# Patient Record
Sex: Female | Born: 1950 | Race: White | Hispanic: No | Marital: Married | Smoking: Never smoker
Health system: Southern US, Community
[De-identification: ages and names within clinical notes are randomized; demographics above are authoritative.]

## PROBLEM LIST (undated history)

## (undated) DIAGNOSIS — K219 Gastro-esophageal reflux disease without esophagitis: Secondary | ICD-10-CM

## (undated) DIAGNOSIS — Z923 Personal history of irradiation: Secondary | ICD-10-CM

## (undated) DIAGNOSIS — K5790 Diverticulosis of intestine, part unspecified, without perforation or abscess without bleeding: Secondary | ICD-10-CM

## (undated) DIAGNOSIS — E079 Disorder of thyroid, unspecified: Secondary | ICD-10-CM

## (undated) DIAGNOSIS — C50919 Malignant neoplasm of unspecified site of unspecified female breast: Secondary | ICD-10-CM

## (undated) DIAGNOSIS — Z973 Presence of spectacles and contact lenses: Secondary | ICD-10-CM

## (undated) DIAGNOSIS — Z803 Family history of malignant neoplasm of breast: Secondary | ICD-10-CM

## (undated) DIAGNOSIS — Z8051 Family history of malignant neoplasm of kidney: Secondary | ICD-10-CM

## (undated) DIAGNOSIS — T7840XA Allergy, unspecified, initial encounter: Secondary | ICD-10-CM

## (undated) DIAGNOSIS — S42309A Unspecified fracture of shaft of humerus, unspecified arm, initial encounter for closed fracture: Secondary | ICD-10-CM

## (undated) DIAGNOSIS — M810 Age-related osteoporosis without current pathological fracture: Secondary | ICD-10-CM

## (undated) HISTORY — DX: Personal history of irradiation: Z92.3

## (undated) HISTORY — DX: Malignant neoplasm of unspecified site of unspecified female breast: C50.919

## (undated) HISTORY — PX: TONSILLECTOMY: SUR1361

## (undated) HISTORY — DX: Diverticulosis of intestine, part unspecified, without perforation or abscess without bleeding: K57.90

## (undated) HISTORY — DX: Allergy, unspecified, initial encounter: T78.40XA

## (undated) HISTORY — DX: Gastro-esophageal reflux disease without esophagitis: K21.9

## (undated) HISTORY — DX: Age-related osteoporosis without current pathological fracture: M81.0

## (undated) HISTORY — DX: Unspecified fracture of shaft of humerus, unspecified arm, initial encounter for closed fracture: S42.309A

## (undated) HISTORY — PX: COLONOSCOPY: SHX174

## (undated) HISTORY — DX: Family history of malignant neoplasm of kidney: Z80.51

## (undated) HISTORY — DX: Family history of malignant neoplasm of breast: Z80.3

## (undated) HISTORY — DX: Disorder of thyroid, unspecified: E07.9

---

## 1995-07-27 HISTORY — PX: BREAST BIOPSY: SHX20

## 1999-05-20 ENCOUNTER — Encounter: Payer: Self-pay | Admitting: Obstetrics and Gynecology

## 1999-05-20 ENCOUNTER — Encounter: Admission: RE | Admit: 1999-05-20 | Discharge: 1999-05-20 | Payer: Self-pay | Admitting: Obstetrics and Gynecology

## 1999-05-29 ENCOUNTER — Other Ambulatory Visit: Admission: RE | Admit: 1999-05-29 | Discharge: 1999-05-29 | Payer: Self-pay | Admitting: Obstetrics and Gynecology

## 2000-04-04 ENCOUNTER — Encounter: Admission: RE | Admit: 2000-04-04 | Discharge: 2000-04-04 | Payer: Self-pay | Admitting: Endocrinology

## 2000-04-04 ENCOUNTER — Encounter: Payer: Self-pay | Admitting: Endocrinology

## 2000-10-05 ENCOUNTER — Other Ambulatory Visit: Admission: RE | Admit: 2000-10-05 | Discharge: 2000-10-05 | Payer: Self-pay | Admitting: Obstetrics and Gynecology

## 2000-12-01 ENCOUNTER — Encounter: Payer: Self-pay | Admitting: Obstetrics and Gynecology

## 2000-12-01 ENCOUNTER — Encounter: Admission: RE | Admit: 2000-12-01 | Discharge: 2000-12-01 | Payer: Self-pay | Admitting: Obstetrics and Gynecology

## 2000-12-22 ENCOUNTER — Encounter: Payer: Self-pay | Admitting: Obstetrics and Gynecology

## 2000-12-22 ENCOUNTER — Encounter: Admission: RE | Admit: 2000-12-22 | Discharge: 2000-12-22 | Payer: Self-pay | Admitting: *Deleted

## 2001-08-23 ENCOUNTER — Encounter: Admission: RE | Admit: 2001-08-23 | Discharge: 2001-08-23 | Payer: Self-pay | Admitting: Obstetrics and Gynecology

## 2001-08-23 ENCOUNTER — Encounter: Payer: Self-pay | Admitting: Obstetrics and Gynecology

## 2002-01-05 ENCOUNTER — Other Ambulatory Visit: Admission: RE | Admit: 2002-01-05 | Discharge: 2002-01-05 | Payer: Self-pay | Admitting: Obstetrics and Gynecology

## 2002-08-29 ENCOUNTER — Encounter: Admission: RE | Admit: 2002-08-29 | Discharge: 2002-08-29 | Payer: Self-pay | Admitting: Obstetrics and Gynecology

## 2002-08-29 ENCOUNTER — Encounter: Payer: Self-pay | Admitting: Obstetrics and Gynecology

## 2002-11-27 ENCOUNTER — Encounter: Admission: RE | Admit: 2002-11-27 | Discharge: 2002-11-27 | Payer: Self-pay | Admitting: Endocrinology

## 2002-11-27 ENCOUNTER — Encounter: Payer: Self-pay | Admitting: Endocrinology

## 2003-03-05 ENCOUNTER — Other Ambulatory Visit: Admission: RE | Admit: 2003-03-05 | Discharge: 2003-03-05 | Payer: Self-pay | Admitting: Obstetrics and Gynecology

## 2004-01-02 ENCOUNTER — Encounter: Admission: RE | Admit: 2004-01-02 | Discharge: 2004-01-02 | Payer: Self-pay | Admitting: Obstetrics and Gynecology

## 2004-03-26 ENCOUNTER — Other Ambulatory Visit: Admission: RE | Admit: 2004-03-26 | Discharge: 2004-03-26 | Payer: Self-pay | Admitting: Obstetrics and Gynecology

## 2004-07-02 ENCOUNTER — Encounter: Admission: RE | Admit: 2004-07-02 | Discharge: 2004-07-02 | Payer: Self-pay | Admitting: Endocrinology

## 2005-03-11 ENCOUNTER — Ambulatory Visit: Payer: Self-pay | Admitting: Internal Medicine

## 2005-04-09 ENCOUNTER — Ambulatory Visit: Payer: Self-pay | Admitting: Internal Medicine

## 2007-03-07 ENCOUNTER — Encounter: Admission: RE | Admit: 2007-03-07 | Discharge: 2007-03-07 | Payer: Self-pay | Admitting: Endocrinology

## 2010-01-30 ENCOUNTER — Encounter: Payer: Self-pay | Admitting: Internal Medicine

## 2010-02-02 ENCOUNTER — Ambulatory Visit: Payer: Self-pay | Admitting: Internal Medicine

## 2010-02-02 DIAGNOSIS — K5730 Diverticulosis of large intestine without perforation or abscess without bleeding: Secondary | ICD-10-CM | POA: Insufficient documentation

## 2010-02-02 DIAGNOSIS — R1310 Dysphagia, unspecified: Secondary | ICD-10-CM | POA: Insufficient documentation

## 2010-02-02 DIAGNOSIS — R1319 Other dysphagia: Secondary | ICD-10-CM

## 2010-03-11 ENCOUNTER — Encounter (INDEPENDENT_AMBULATORY_CARE_PROVIDER_SITE_OTHER): Payer: Self-pay | Admitting: *Deleted

## 2010-03-11 ENCOUNTER — Ambulatory Visit: Payer: Self-pay | Admitting: Internal Medicine

## 2010-03-11 LAB — CONVERTED CEMR LAB: UREASE: NEGATIVE

## 2010-06-05 ENCOUNTER — Ambulatory Visit: Payer: Self-pay | Admitting: Internal Medicine

## 2010-07-26 HISTORY — PX: UPPER GI ENDOSCOPY: SHX6162

## 2010-08-25 NOTE — Procedures (Signed)
Summary: Upper Endoscopy  Patient: [name] Note: All result statuses are Final unless otherwise noted.  Tests: (1) Upper Endoscopy (EGD)   EGD Upper Endoscopy       DONE     [facility]     [address]     [place], [place]  [address]           ENDOSCOPY PROCEDURE REPORT           PATIENT:  Krista Butler, Krista Butler  MR#:  [id number]     BIRTHDATE:  1951/05/13, 59 yrs. old  GENDER:  female           ENDOSCOPIST:  Wilhemina Bonito. Eda Keys, MD     Referred by:  Adrian Prince, M.D.           PROCEDURE DATE:  03/11/2010     PROCEDURE:  EGD with biopsy     ASA CLASS:  Class II     INDICATIONS:  dysphagia - globus type sensation           MEDICATIONS:   Fentanyl 75 mcg IV, Versed 7 mg IV     TOPICAL ANESTHETIC:  Exactacain Spray           DESCRIPTION OF PROCEDURE:   After the risks benefits and     alternatives of the procedure were thoroughly explained, informed     consent was obtained.  The LB [id number]-H180 [id number] endoscope was     introduced through the mouth and advanced to the second portion of     the duodenum, without limitations.  The instrument was slowly     withdrawn as the mucosa was fully examined.     <<PROCEDUREIMAGES>>           The pharynx was normal. The esophagus and gastroesophageal     junction were completely normal in appearance.  Multiple erosions     were found in the antrum. Clo bx taken. The stomach was otherwise     normal.  The duodenal bulb was normal in appearance, as was the     postbulbar duodenum.    Retroflexed views revealed no     abnormalities.    The scope was then withdrawn from the patient     and the procedure completed.           COMPLICATIONS:  None           ENDOSCOPIC IMPRESSION:     1) Normal esophagus     2) Small Erosions, multiple in the antrum (? due to ASA).     Otherwise normal stomach     3) Normal duodenum           RECOMMENDATIONS:     1) OMEPRAZOLE 20MG  PO QD;  # 30; 6 REFILLS     2) Rx CLO if positive     3) Call office  next 2-3 days to schedule an office appointment     for 8-10 WEEKS W/ DR Marina Goodell           ______________________________     Wilhemina Bonito. Eda Keys, MD           CC:  Adrian Prince, MD, The Patient           n.     eSIGNED:   Wilhemina Bonito. Eda Keys at 03/11/2010 03:59 PM           Ivette Loyal, [id number]  Note: An exclamation mark (!) indicates a result that was  not dispersed into the flowsheet. Document Creation Date: 03/11/2010 3:59 PM _______________________________________________________________________  (1) Order result status: Final Collection or observation date-time: 03/11/2010 15:49 Requested date-time:  Receipt date-time:  Reported date-time:  Referring Physician:   Ordering Physician: Fransico Setters [phone]) Specimen Source:  Source: Launa Grill Order Number: [id number] Lab site:   Appended Document: Upper Endoscopy ROV scheduled letter mailed

## 2010-08-25 NOTE — Procedures (Signed)
Summary: Colonoscopy   Colonoscopy  Procedure date:  04/09/2005  Findings:      Location:  [facility].  Results: Diverticulosis.         Comments:      Repeat colonoscopy in 10 years.    Procedures Next Due Date:    Colonoscopy: 03/2015 Patient Name: Krista Butler, Krista Butler MRN:  Procedure Procedures: Colonoscopy CPT: 74259.  Personnel: Endoscopist: Wilhemina Bonito. Marina Goodell, MD.  Referred By: Adrian Prince, MD.  Exam Location: Exam performed in Outpatient Clinic. Outpatient  Patient Consent: Procedure, Alternatives, Risks and Benefits discussed, consent obtained, from patient. Consent was obtained by the RN.  Indications  Average Risk Screening Routine.  History  Current Medications: Patient is not currently taking Coumadin.  Pre-Exam Physical: Performed Apr 09, 2005. Entire physical exam was normal.  Exam Exam: Extent of exam reached: Terminal Ileum, extent intended: Terminal Ileum.  The cecum was identified by appendiceal orifice and IC valve. Patient position: on left side. Colon retroflexion performed. Images taken. ASA Classification: II. Tolerance: excellent.  Monitoring: Pulse and BP monitoring, Oximetry used. Supplemental O2 given.  Colon Prep Used Miralax for colon prep. Prep results: excellent.  Sedation Meds: Patient assessed and found to be appropriate for moderate (conscious) sedation. Fentanyl 75 mcg. given IV. Versed 8 mg. given IV.  Findings NORMAL EXAM: Cecum to Rectum.  NORMAL EXAM: Ileum.  - DIVERTICULOSIS: Descending Colon to Sigmoid Colon. ICD9: Diverticulosis, Colon: 562.10. Comments: marked changes.   Assessment Abnormal examination, see findings above.  Diagnoses: 562.10: Diverticulosis, Colon.   Comments: NO POLYPS SEEN Events  Unplanned Interventions: No intervention was required.  Unplanned Events: There were no complications. Plans Disposition: After procedure patient sent to recovery. After recovery patient sent home.   Scheduling/Referral: Colonoscopy, to Wilhemina Bonito. Marina Goodell, MD, IN 10 YEARS FOR REPEAT SCREENING (sooner if clinically indicated),    This report was created from the original endoscopy report, which was reviewed and signed by the above listed endoscopist.   cc: Adrian Prince, MD

## 2010-08-25 NOTE — Miscellaneous (Signed)
Summary: Orders Update  Clinical Lists Changes  Orders: Added new Test order of TLB-H Pylori Screen Gastric Biopsy (83013-CLOTEST) - Signed 

## 2010-08-25 NOTE — Miscellaneous (Signed)
Summary: omeprazole rx.  Clinical Lists Changes  Medications: Added new medication of OMEPRAZOLE 20 MG  CPDR (OMEPRAZOLE) 1 each day 30 minutes before meal - Signed Rx of OMEPRAZOLE 20 MG  CPDR (OMEPRAZOLE) 1 each day 30 minutes before meal;  #30 x 6;  Signed;  Entered by: Darlyn Read RN;  Authorized by: Hilarie Fredrickson MD;  Method used: Electronically to [facility] [address], [place], [place], [place]  [address], Ph: [phone], Fax: [phone]    Prescriptions: OMEPRAZOLE 20 MG  CPDR (OMEPRAZOLE) 1 each day 30 minutes before meal  #30 x 6   Entered by:   Darlyn Read RN   Authorized by:   Hilarie Fredrickson MD   Signed by:   Darlyn Read RN on 03/11/2010   Method used:   Electronically to        [facility] (retail)       [address]       [place], [place]  [address]       Ph: [phone]       Fax: [phone]   RxID:   [id number]

## 2010-08-25 NOTE — Letter (Signed)
Summary: EGD Instructions  [facility]  [address] [place], [place] [address]   Phone: [phone]  Fax: [phone]       Krista Butler    1951/06/19    MRN: [id number]       Procedure Day /Date:WEDNESDAY, 03/11/10     Arrival Time: 2:00 pm       Procedure Time:3:00 pm     Location of Procedure:                    x [facility] (4th Floor)  PREPARATION FOR ENDOSCOPY   On WEDNESDAY, 03/11/10 THE DAY OF THE PROCEDURE:  1.   No solid foods, milk or milk products are allowed after midnight the night before your procedure.  2.   Do not drink anything colored red or purple.  Avoid juices with pulp.  No orange juice.  3.  You may drink clear liquids until1:00 PM which is 2 hours before your procedure.                                                                                                CLEAR LIQUIDS INCLUDE: Water Jello Ice Popsicles Tea (sugar ok, no milk/cream) Powdered fruit flavored drinks Coffee (sugar ok, no milk/cream) Gatorade Juice: apple, white grape, white cranberry  Lemonade Clear bullion, consomm, broth Carbonated beverages (any kind) Strained chicken noodle soup Hard Candy   MEDICATION INSTRUCTIONS  Unless otherwise instructed, you should take regular prescription medications with a small sip of water as early as possible the morning of your procedure.             OTHER INSTRUCTIONS  You will need a responsible adult at least 62 years of age to accompany you and drive you home.   This person must remain in the waiting room during your procedure.  Wear loose fitting clothing that is easily removed.  Leave jewelry and other valuables at home.  However, you may wish to bring a book to read or an iPod/MP3 player to listen to music as you wait for your procedure to start.  Remove all body piercing jewelry and leave at home.  Total time from sign-in until discharge is approximately 2-3 hours.  You should go home  directly after your procedure and rest.  You can resume normal activities the day after your procedure.  The day of your procedure you should not:   Drive   Make legal decisions   Operate machinery   Drink alcohol   Return to work  You will receive specific instructions about eating, activities and medications before you leave.    The above instructions have been reviewed and explained to me by   _______________________    I fully understand and can verbalize these instructions _____________________________ Date _________

## 2010-08-25 NOTE — Assessment & Plan Note (Signed)
Summary: dysphagia.Marland Kitchenem   History of Present Illness Visit Type: Initial Consult Primary GI MD: Yancey Flemings MD Primary Provider: Adrian Prince, MD Requesting Provider: Adrian Prince, MD Chief Complaint: Patient states that it feel as if there is something at the top of her esophagus when she eats or drinks History of Present Illness:   60 year old female with a history ofhypothyroidism, hyperlipidemia, osteoporosis, and DJD. She presented today with the chief complaint of globus sensation which she has noticed intermittently over the past year. This does not interfere with swallowing. She was diagnosed with reflux disease about 18 months ago. Symptoms at that time were negative abdominal nausea. No pyrosis. She changed her diet and states that this helped. A PPI. Other complaint includes increase sinuses and postnasal drip. No family history of esophageal disorders. Prior colonoscopy in 2006 was negative for neoplasia. Left-sided diverticulosis present.   GI Review of Systems    Reports acid reflux, belching, bloating, dysphagia with liquids, dysphagia with solids, and  nausea.      Denies abdominal pain, chest pain, heartburn, loss of appetite, vomiting, vomiting blood, weight loss, and  weight gain.      Reports diverticulosis.     Denies anal fissure, black tarry stools, change in bowel habit, constipation, diarrhea, fecal incontinence, heme positive stool, hemorrhoids, irritable bowel syndrome, jaundice, light color stool, liver problems, rectal bleeding, and  rectal pain. Preventive Screening-Counseling & Management  Alcohol-Tobacco     Smoking Status: never      Drug Use:  no.      Current Medications (verified): 1)  Synthroid 200 Mcg Tabs (Levothyroxine Sodium) .Marland Kitchen.. 1 By Mouth Once Daily 2)  Fosamax 70 Mg Tabs (Alendronate Sodium) .Marland Kitchen.. 1 By Mouth Weekly 3)  Vitamin C 500 Mg Tabs (Ascorbic Acid) .Marland Kitchen.. 1 By Mouth Once Daily 4)  Biotin 5 Mg Caps (Biotin) .Marland Kitchen.. 1 By Mouth Once  Daily 5)  Glucosamine 500 Mg Caps (Glucosamine Sulfate) .Marland Kitchen.. 1 By Mouth Once Daily 6)  B Complex  Tabs (B Complex Vitamins) .... Take By Mouth Once Daily 7)  Fish Oil 1000 Mg Caps (Omega-3 Fatty Acids) .... Once Daily 8)  Vitamin D3 2000 Unit Caps (Cholecalciferol) .... Once Daily 9)  Lutein 6 Mg Caps (Lutein) .... Once Daily 10)  Zinc 25 Mg Tabs (Zinc) .... Once Daily  Allergies (verified): No Known Drug Allergies  Past History:  Past Medical History: Reviewed history from 01/30/2010 and no changes required. 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  Alcohol Use - yes Daily Caffeine Use -5 Illicit Drug Use - no Smoking Status:  never Drug Use:  no  Review of Systems       The patient complains of allergy/sinus, muscle pains/cramps, urine leakage, and voice change.  The patient denies anemia, anxiety-new, arthritis/joint pain, back pain, blood in urine, breast changes/lumps, change in vision, confusion, cough, coughing up blood, depression-new, fainting, fatigue, fever, headaches-new, hearing problems, heart murmur, heart rhythm changes, itching, menstrual pain, night sweats, nosebleeds, pregnancy symptoms, shortness of breath, skin rash, sleeping problems, sore throat, swelling of feet/legs, swollen lymph glands, thirst - excessive , urination - excessive , urination changes/pain, and vision changes.    Vital Signs:  Patient profile:   60 year old female Height:      67.5 inches Weight:  142.44 pounds BMI:     22.06 Pulse rate:   68 / minute Pulse rhythm:   regular BP sitting:   96 / 60  (left arm) Cuff size:   regular  Vitals Entered By: June McMurray CMA Duncan Dull) (February 02, 2010 2:43 PM)  Physical  Exam  General:  Well developed, well nourished, no acute distress. Head:  Normocephalic and atraumatic. Eyes:  PERRLA, no icterus. Ears:  Normal auditory acuity. Nose:  No deformity, discharge,  or lesions. Mouth:  No deformity or lesions, dentition normal. Neck:  Supple; no masses or thyromegaly. Lungs:  Clear throughout to auscultation. Heart:  Regular rate and rhythm; no murmurs, rubs,  or bruits. Abdomen:  Soft, nontender and nondistended. No masses, hepatosplenomegaly or hernias noted. Normal bowel sounds. Msk:  Symmetrical with no gross deformities. Normal posture. Pulses:  Normal pulses noted. Extremities:  no edema Neurologic:  alert and oriented Skin:  no rash or jaundice Psych:  Alert and cooperative. Normal mood and affect.   Impression & Recommendations:  Problem # 1:  DYSPHAGIA (ICD-787.29) Globus sensation. Possible causes include acid reflux, postnasal drip, or functional as an anxiety. Though unlikely, need to rule out structural lesion.  Plan: #1. Diagnostic upper endoscopy. The nature of the procedure as well as risks, benefits, and alternatives have been reviewed. She understood and agreed to proceed #2. Consider trial of PPI if esophagitis noted or empirically if not done upon the degree of distress the symptom causes this patient.  Problem # 2:  DIVERTICULOSIS-COLON (ICD-562.10) incidental on colonoscopy. Asymptomatic at this time  Problem # 3:  SCREENING COLORECTAL-CANCER (ICD-V76.51) index exam 2006 with out neoplasia. Due for routine followup around 2016.  Other Orders: EGD (EGD)  Patient Instructions: 1)  copy: Dr. Adrian Prince 2)  EGD 03/11/10 3:00 pm arrive at 2:00 pm 3)  Upper Endoscopy brochure given.  4)  The medication list was reviewed and reconciled.  All changed / newly prescribed medications were explained.  A complete medication list was provided to the patient / caregiver.

## 2010-08-25 NOTE — Letter (Signed)
Summary: Appt Reminder 2  [facility]  [address] [place], [place] [address]   Phone: [phone]  Fax: [phone]        March 11, 2010 MRN: [id number]    Krista Butler [address] [place], [place]  [address]    Dear Ms. Kinnear,   You have a return appointment with Dr. Marina Goodell on 04/28/10 at 830 am.  Please remember to bring a complete list of the medicines you are taking, your insurance card and your co-pay.  If you have to cancel or reschedule this appointment, please call before 5:00 pm the evening before to avoid a cancellation fee.  If you have any questions or concerns, please call [phone].    Sincerely,    Chales Abrahams CMA (AAMA)  Appended Document: Appt Reminder 2 letter mailed

## 2010-08-25 NOTE — Miscellaneous (Signed)
Summary: Medication List  Clinical Lists Changes  Medications: Added new medication of SYNTHROID 200 MCG TABS (LEVOTHYROXINE SODIUM) 1 by mouth once daily Added new medication of FOSAMAX 70 MG TABS (ALENDRONATE SODIUM) 1 by mouth weekly Added new medication of NASONEX 50 MCG/ACT SUSP (MOMETASONE FUROATE) use as directed Added new medication of VITAMIN C 500 MG TABS (ASCORBIC ACID) 1 by mouth once daily Added new medication of BIOTIN 5 MG CAPS (BIOTIN) 1 by mouth once daily Added new medication of GLUCOSAMINE 500 MG CAPS (GLUCOSAMINE SULFATE) 1 by mouth once daily Added new medication of B COMPLEX  TABS (B COMPLEX VITAMINS) take by mouth once daily

## 2010-08-25 NOTE — Assessment & Plan Note (Signed)
Summary: Followup post EGD   History of Present Illness Visit Type: Follow-up Visit Primary GI MD: Yancey Flemings MD Primary Provider: Adrian Prince, MD Requesting Provider: Adrian Prince, MD Chief Complaint: Post EGD, throat problem has resolved,patient still has some occasional burning sensation in abdomen at times History of Present Illness:   60 year old female with a history of hypothyroidism, hyperlipidemia, osteoporosis, and DJD. She was evaluated February 02, 2010 regarding globus sensation. See that dictation. She subsequently underwent upper endoscopy on March 11, 2010. Examination was normal except for some antral erosions felt likely secondary to NSAIDs. Testing for H. pylori was negative. She was empirically placed on omeprazole 20 mg daily and presents today for followup as requested. Her globus sensation has completely resolved. No other problems or complaints except for occasional vague mid abdominal burning which is chronic.   GI Review of Systems    Reports acid reflux.      Denies abdominal pain, belching, bloating, chest pain, dysphagia with liquids, dysphagia with solids, heartburn, loss of appetite, nausea, vomiting, vomiting blood, weight loss, and  weight gain.        Denies anal fissure, black tarry stools, change in bowel habit, constipation, diarrhea, diverticulosis, fecal incontinence, heme positive stool, hemorrhoids, irritable bowel syndrome, jaundice, light color stool, liver problems, rectal bleeding, and  rectal pain.    Current Medications (verified): 1)  Synthroid 200 Mcg Tabs (Levothyroxine Sodium) .Marland Kitchen.. 1 By Mouth Once Daily 2)  Fosamax 70 Mg Tabs (Alendronate Sodium) .Marland Kitchen.. 1 By Mouth Weekly 3)  Vitamin C 500 Mg Tabs (Ascorbic Acid) .Marland Kitchen.. 1 By Mouth Once Daily 4)  Biotin 5 Mg Caps (Biotin) .Marland Kitchen.. 1 By Mouth Once Daily 5)  Glucosamine 500 Mg Caps (Glucosamine Sulfate) .Marland Kitchen.. 1 By Mouth Once Daily 6)  B Complex  Tabs (B Complex Vitamins) .... Take By Mouth Once  Daily 7)  Fish Oil 1000 Mg Caps (Omega-3 Fatty Acids) .... Once Daily 8)  Vitamin D3 2000 Unit Caps (Cholecalciferol) .... Once Daily 9)  Lutein 6 Mg Caps (Lutein) .... Once Daily 10)  Zinc 25 Mg Tabs (Zinc) .... Once Daily 11)  Omeprazole 20 Mg  Cpdr (Omeprazole) .Marland Kitchen.. 1 Each Day 30 Minutes Before Meal  Allergies (verified): No Known Drug Allergies  Past History:  Past Medical History: Reviewed history from 01/30/2010 and no changes required. GERD Hyperlipidemia Osteoporosis Osteoarthritis Goiter DJD  Past Surgical History: Reviewed history from 02/02/2010 and no changes required. Breast Biopsy(L) Tonsillectomy  Family History: Reviewed history from 02/02/2010 and no changes required. No FH of Colon Cancer: Family History of Diabetes:  Family History of Heart Disease:   Social History: Reviewed history from 02/02/2010 and no changes required. 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  Alcohol Use - yes Daily Caffeine Use -5 Illicit Drug Use - no  Review of Systems       The patient complains of allergy/sinus, arthritis/joint pain, back pain, fatigue, and headaches-new.  The patient denies anemia, anxiety-new, blood in urine, breast changes/lumps, change in vision, confusion, cough, coughing up blood, depression-new, fainting, fever, hearing problems, heart murmur, heart rhythm changes, itching, menstrual pain, muscle pains/cramps, night sweats, nosebleeds, pregnancy symptoms, shortness of breath, skin rash, sleeping problems, sore throat, swelling of feet/legs, swollen lymph glands, thirst - excessive , urination - excessive , urination changes/pain, urine leakage, vision changes, and voice change.    Vital Signs:  Patient profile:   60 year old female Height:      67.5 inches Weight:  140.13 pounds BMI:     21.70 Pulse rate:   80 / minute Pulse rhythm:   regular BP sitting:   90 / 62  (left arm) Cuff size:   regular  Vitals Entered By: June  McMurray CMA Duncan Dull) (June 05, 2010 2:34 PM)  Physical Exam  General:  Well developed, well nourished, no acute distress. Head:  Normocephalic and atraumatic. Eyes:  PERRLA, no icterus. Neck:  Supple; no masses or thyromegaly. Lungs:  Clear throughout to auscultation. Heart:  Regular rate and rhythm; no murmurs, rubs,  or bruits. Abdomen:  Soft, nontender and nondistended. No masses, hepatosplenomegaly or hernias noted. Normal bowel sounds. Pulses:  Normal pulses noted. Neurologic:  alert and oriented Skin:  no jaundice Psych:  Alert and cooperative. Normal mood and affect.   Impression & Recommendations:  Problem # 1:  DYSPHAGIA UNSPECIFIED (ICD-787.20) globus type sensation with negative upper endoscopy. Improvement after a trial of PPI therapy. Symptoms are secondary to GERD or functional and related to anxiety with subsequent resolution upon reassurance. We discussed this.  Plan:  #1. At this point, discontinue PPI. If symptoms return and PPI helpful, and use accordingly.  #2. GI followup p.r.n. Return to the care of Dr. Evlyn Kanner  Problem # 2:  SCREENING COLORECTAL-CANCER (ICD-V76.51) up-to-date. Routine followup due around 2016  Problem # 3:  DIVERTICULOSIS-COLON (ICD-562.10) Assessment: Comment Only  Patient Instructions: 1)  Copy sent to : Adrian Prince, MD 2)  Please schedule a follow-up appointment as needed.  3)  The medication list was reviewed and reconciled.  All changed / newly prescribed medications were explained.  A complete medication list was provided to the patient / caregiver.

## 2010-11-27 ENCOUNTER — Emergency Department (HOSPITAL_COMMUNITY)
Admission: EM | Admit: 2010-11-27 | Discharge: 2010-11-27 | Disposition: A | Discharge status: Home / Self Care | Payer: BC Managed Care – PPO | Attending: Emergency Medicine | Admitting: Emergency Medicine

## 2010-11-27 ENCOUNTER — Emergency Department (HOSPITAL_COMMUNITY): Payer: BC Managed Care – PPO

## 2010-11-27 DIAGNOSIS — R42 Dizziness and giddiness: Secondary | ICD-10-CM | POA: Insufficient documentation

## 2010-11-27 DIAGNOSIS — K2970 Gastritis, unspecified, without bleeding: Secondary | ICD-10-CM | POA: Insufficient documentation

## 2010-11-27 DIAGNOSIS — E039 Hypothyroidism, unspecified: Secondary | ICD-10-CM | POA: Insufficient documentation

## 2010-11-27 DIAGNOSIS — R209 Unspecified disturbances of skin sensation: Secondary | ICD-10-CM | POA: Insufficient documentation

## 2010-11-27 LAB — CBC
Hemoglobin: 12.7 g/dL (ref 12.0–15.0)
MCH: 30.4 pg (ref 26.0–34.0)
MCHC: 34.1 g/dL (ref 30.0–36.0)
MCV: 89 fL (ref 78.0–100.0)
Platelets: 222 10*3/uL (ref 150–400)
RDW: 12.4 % (ref 11.5–15.5)

## 2010-11-27 LAB — BASIC METABOLIC PANEL
Calcium: 9.7 mg/dL (ref 8.4–10.5)
Chloride: 100 mEq/L (ref 96–112)
GFR calc Af Amer: >60 mL/min (ref >60)
GFR calc non Af Amer: >60 mL/min (ref >60)
Glucose, Bld: 97 mg/dL (ref 70–99)
Potassium: 3.5 mEq/L (ref 3.5–5.1)

## 2010-11-27 LAB — DIFFERENTIAL
Lymphocytes Relative: 38 % (ref 12–46)
Lymphs Abs: 2 10*3/uL (ref 0.7–4.0)
Monocytes Relative: 8 % (ref 3–12)

## 2010-11-27 LAB — TROPONIN I: Troponin I: <0.3 ng/mL (ref <0.30)

## 2010-11-27 LAB — CK TOTAL AND CKMB (NOT AT ARMC)
Relative Index: INVALID (ref 0.0–2.5)
Total CK: 84 U/L (ref 7–177)

## 2011-05-06 ENCOUNTER — Other Ambulatory Visit: Payer: Self-pay | Admitting: Internal Medicine

## 2012-05-13 ENCOUNTER — Other Ambulatory Visit: Payer: Self-pay | Admitting: Internal Medicine

## 2012-07-26 HISTORY — PX: BREAST LUMPECTOMY: SHX2

## 2012-10-18 ENCOUNTER — Other Ambulatory Visit: Payer: Self-pay | Admitting: Obstetrics and Gynecology

## 2012-10-18 DIAGNOSIS — N6001 Solitary cyst of right breast: Secondary | ICD-10-CM

## 2012-10-30 ENCOUNTER — Ambulatory Visit
Admission: RE | Admit: 2012-10-30 | Discharge: 2012-10-30 | Disposition: A | Discharge status: Home / Self Care | Payer: BC Managed Care – PPO | Source: Ambulatory Visit | Attending: Obstetrics and Gynecology | Admitting: Obstetrics and Gynecology

## 2012-10-30 ENCOUNTER — Other Ambulatory Visit: Payer: Self-pay | Admitting: Obstetrics and Gynecology

## 2012-10-30 DIAGNOSIS — N6001 Solitary cyst of right breast: Secondary | ICD-10-CM

## 2012-11-06 ENCOUNTER — Ambulatory Visit
Admission: RE | Admit: 2012-11-06 | Discharge: 2012-11-06 | Disposition: A | Discharge status: Home / Self Care | Payer: BC Managed Care – PPO | Source: Ambulatory Visit | Attending: Obstetrics and Gynecology | Admitting: Obstetrics and Gynecology

## 2012-11-06 ENCOUNTER — Other Ambulatory Visit: Payer: Self-pay | Admitting: Obstetrics and Gynecology

## 2012-11-06 DIAGNOSIS — N6001 Solitary cyst of right breast: Secondary | ICD-10-CM

## 2012-11-07 ENCOUNTER — Other Ambulatory Visit: Payer: Self-pay | Admitting: Obstetrics and Gynecology

## 2012-11-07 ENCOUNTER — Ambulatory Visit
Admission: RE | Admit: 2012-11-07 | Discharge: 2012-11-07 | Disposition: A | Discharge status: Home / Self Care | Payer: BC Managed Care – PPO | Source: Ambulatory Visit | Attending: Obstetrics and Gynecology | Admitting: Obstetrics and Gynecology

## 2012-11-07 DIAGNOSIS — C50912 Malignant neoplasm of unspecified site of left female breast: Secondary | ICD-10-CM

## 2012-11-07 DIAGNOSIS — N6001 Solitary cyst of right breast: Secondary | ICD-10-CM

## 2012-11-07 DIAGNOSIS — R921 Mammographic calcification found on diagnostic imaging of breast: Secondary | ICD-10-CM

## 2012-11-08 ENCOUNTER — Ambulatory Visit
Admission: RE | Admit: 2012-11-08 | Discharge: 2012-11-08 | Disposition: A | Discharge status: Home / Self Care | Payer: BC Managed Care – PPO | Source: Ambulatory Visit | Attending: Obstetrics and Gynecology | Admitting: Obstetrics and Gynecology

## 2012-11-08 ENCOUNTER — Other Ambulatory Visit: Payer: Self-pay | Admitting: Obstetrics and Gynecology

## 2012-11-08 DIAGNOSIS — R921 Mammographic calcification found on diagnostic imaging of breast: Secondary | ICD-10-CM

## 2012-11-09 ENCOUNTER — Telehealth: Payer: Self-pay | Admitting: *Deleted

## 2012-11-09 ENCOUNTER — Inpatient Hospital Stay: Admission: RE | Admit: 2012-11-09 | Payer: BC Managed Care – PPO | Source: Ambulatory Visit

## 2012-11-09 DIAGNOSIS — C50211 Malignant neoplasm of upper-inner quadrant of right female breast: Secondary | ICD-10-CM

## 2012-11-09 DIAGNOSIS — C50212 Malignant neoplasm of upper-inner quadrant of left female breast: Secondary | ICD-10-CM | POA: Insufficient documentation

## 2012-11-09 NOTE — Telephone Encounter (Signed)
Confirmed [facility] for 11/15/12 at 1200.  Instructions and contact information given. 

## 2012-11-10 ENCOUNTER — Ambulatory Visit
Admission: RE | Admit: 2012-11-10 | Discharge: 2012-11-10 | Disposition: A | Discharge status: Home / Self Care | Payer: BC Managed Care – PPO | Source: Ambulatory Visit | Attending: Obstetrics and Gynecology | Admitting: Obstetrics and Gynecology

## 2012-11-10 DIAGNOSIS — C50912 Malignant neoplasm of unspecified site of left female breast: Secondary | ICD-10-CM

## 2012-11-10 MED ORDER — GADOBENATE DIMEGLUMINE 529 MG/ML IV SOLN
13.0000 mL | Freq: Once | INTRAVENOUS | Status: AC | PRN
  Administered 2012-11-10: 13 mL via INTRAVENOUS

## 2012-11-15 ENCOUNTER — Encounter (INDEPENDENT_AMBULATORY_CARE_PROVIDER_SITE_OTHER): Payer: Self-pay | Admitting: General Surgery

## 2012-11-15 ENCOUNTER — Ambulatory Visit: Payer: BC Managed Care – PPO

## 2012-11-15 ENCOUNTER — Encounter: Payer: Self-pay | Admitting: *Deleted

## 2012-11-15 ENCOUNTER — Ambulatory Visit: Payer: BC Managed Care – PPO | Admitting: Physical Therapy

## 2012-11-15 ENCOUNTER — Ambulatory Visit (HOSPITAL_BASED_OUTPATIENT_CLINIC_OR_DEPARTMENT_OTHER): Payer: BC Managed Care – PPO | Admitting: Oncology

## 2012-11-15 ENCOUNTER — Other Ambulatory Visit: Payer: Self-pay | Admitting: Oncology

## 2012-11-15 ENCOUNTER — Ambulatory Visit
Admission: RE | Admit: 2012-11-15 | Discharge: 2012-11-15 | Disposition: A | Discharge status: Home / Self Care | Payer: BC Managed Care – PPO | Source: Ambulatory Visit | Attending: Radiation Oncology | Admitting: Radiation Oncology

## 2012-11-15 ENCOUNTER — Ambulatory Visit (HOSPITAL_BASED_OUTPATIENT_CLINIC_OR_DEPARTMENT_OTHER): Payer: BC Managed Care – PPO | Admitting: General Surgery

## 2012-11-15 ENCOUNTER — Other Ambulatory Visit (HOSPITAL_BASED_OUTPATIENT_CLINIC_OR_DEPARTMENT_OTHER): Payer: BC Managed Care – PPO | Admitting: Lab

## 2012-11-15 VITALS — BP 101/67 | HR 79 | Temp 97.9°F | Resp 20 | Ht 66.5 in | Wt 140.4 lb

## 2012-11-15 DIAGNOSIS — C50212 Malignant neoplasm of upper-inner quadrant of left female breast: Secondary | ICD-10-CM

## 2012-11-15 DIAGNOSIS — C50219 Malignant neoplasm of upper-inner quadrant of unspecified female breast: Secondary | ICD-10-CM

## 2012-11-15 DIAGNOSIS — C50211 Malignant neoplasm of upper-inner quadrant of right female breast: Secondary | ICD-10-CM

## 2012-11-15 DIAGNOSIS — C50919 Malignant neoplasm of unspecified site of unspecified female breast: Secondary | ICD-10-CM

## 2012-11-15 DIAGNOSIS — D0590 Unspecified type of carcinoma in situ of unspecified breast: Secondary | ICD-10-CM

## 2012-11-15 DIAGNOSIS — D0512 Intraductal carcinoma in situ of left breast: Secondary | ICD-10-CM

## 2012-11-15 LAB — CBC WITH DIFFERENTIAL/PLATELET
Basophils Absolute: 0 10*3/uL (ref 0.0–0.1)
EOS%: 1.8 % (ref 0.0–7.0)
HCT: 39.4 % (ref 34.8–46.6)
HGB: 13.6 g/dL (ref 11.6–15.9)
LYMPH%: 34.5 % (ref 14.0–49.7)
MCH: 30.8 pg (ref 25.1–34.0)
MCV: 89.1 fL (ref 79.5–101.0)
MONO%: 8.2 % (ref 0.0–14.0)
NEUT%: 54.4 % (ref 38.4–76.8)
Platelets: 232 10*3/uL (ref 145–400)

## 2012-11-15 LAB — COMPREHENSIVE METABOLIC PANEL (CC13)
ALT: 42 U/L (ref 0–55)
Albumin: 3.7 g/dL (ref 3.5–5.0)
CO2: 25 mEq/L (ref 22–29)
Calcium: 9.8 mg/dL (ref 8.4–10.4)
Chloride: 102 mEq/L (ref 98–107)
Glucose: 84 mg/dl (ref 70–99)
Potassium: 3.9 mEq/L (ref 3.5–5.1)
Sodium: 139 mEq/L (ref 136–145)
Total Bilirubin: 0.37 mg/dL (ref 0.20–1.20)
Total Protein: 8 g/dL (ref 6.4–8.3)

## 2012-11-15 NOTE — Progress Notes (Signed)
Patient ID: Krista Butler, female   DOB: 04/15/1951, 62 y.o.   MRN: [id number]  Chief Complaint  Patient presents with  . Other    breast cancer    HPI Krista Butler is a 62 y.o. female.  Referred by Dr Richard Holland HPI 62 yof who had right breast thickening and pain and underwent evaluation with mm.  She did not have any left breast symptoms.  She has had a left breast cyst excised in the past.  Her mm in area of questionable thickening shows no discrete mass or distortion.  She has numerous bilateral calcifications.  However located slightly laterally and centrally within the left breast there are new linear/branching calcifications.  These measure 12 mm by mammography.  There is also in upper inner quadrant of the right breast a heterogenous group of calcifications measuring 1.1 cm.  Stereotactic biopsy of both sides were performed.  MR shows subtle clumped and linear enhancement seen in the retroareolar of the left breast measuring 0.8x1x0.6cm.  There are no suspicious areas in the left breast.  There is no adenopathy. She comes in to discuss her options.   Past Medical History  Diagnosis Date  . Breast cancer   . Thyroid disease   . Osteoporosis     Past Surgical History  Procedure Laterality Date  . Tonsillectomy      History reviewed. No pertinent family history.  Social History History  Substance Use Topics  . Smoking status: Former Smoker  . Smokeless tobacco: Not on file  . Alcohol Use: 1.2 oz/week    2 Glasses of wine per week    No Known Allergies  Current Outpatient Prescriptions  Medication Sig Dispense Refill  . fish oil-omega-3 fatty acids 1000 MG capsule Take 2 g by mouth daily.      . levothyroxine (SYNTHROID, LEVOTHROID) 175 MCG tablet Take 175 mcg by mouth daily before breakfast.      . omeprazole (PRILOSEC) 20 MG capsule TAKE ONE CAPSULE BY MOUTH EVERY DAY 30 MINUTES BEFORE MEAL  30 capsule  0  . OVER THE COUNTER MEDICATION Take 50 mcg by mouth 3  (three) times daily with meals. Calcium with K2, taking 2 with each meal.      . vitamin C (ASCORBIC ACID) 500 MG tablet Take 500 mg by mouth daily. Taking 2 tab daily      . zinc gluconate 50 MG tablet Take 50 mg by mouth daily. 30 mg       No current facility-administered medications for this visit.    Review of Systems Review of Systems  Constitutional: Negative for fever, chills and unexpected weight change.  HENT: Negative for hearing loss, congestion, sore throat, trouble swallowing and voice change.   Eyes: Negative for visual disturbance.  Respiratory: Negative for cough and wheezing.   Cardiovascular: Negative for chest pain, palpitations and leg swelling.  Gastrointestinal: Negative for nausea, vomiting, abdominal pain, diarrhea, constipation, blood in stool, abdominal distention and anal bleeding.  Genitourinary: Negative for hematuria, vaginal bleeding and difficulty urinating.  Musculoskeletal: Negative for arthralgias.  Skin: Negative for rash and wound.  Neurological: Negative for seizures, syncope and headaches.  Hematological: Negative for adenopathy. Bruises/bleeds easily.  Psychiatric/Behavioral: Negative for confusion.    There were no vitals taken for this visit.  Physical Exam Physical Exam  Vitals reviewed. Constitutional: She appears well-developed and well-nourished.  Eyes: No scleral icterus.  Cardiovascular: Normal rate, regular rhythm and normal heart sounds.   Pulmonary/Chest: Effort normal and   breath sounds normal. She has no wheezes. She has no rales. Right breast exhibits no inverted nipple, no mass, no nipple discharge, no skin change and no tenderness. Left breast exhibits no inverted nipple, no mass, no nipple discharge, no skin change and no tenderness. Breasts are symmetrical.  Lymphadenopathy:    She has no cervical adenopathy.    She has no axillary adenopathy.       Right: No supraclavicular adenopathy present.       Left: No supraclavicular  adenopathy present.    Data Reviewed BILATERAL BREAST MRI WITH AND WITHOUT CONTRAST  Technique: Multiplanar, multisequence MR images of both breasts  were obtained prior to and following the intravenous administration  of 13ml of Multihance. Three dimensional images were evaluated at  the independent DynaCad workstation.  Comparison: Multiple prior mammograms most recently dated  11/08/2012  Findings: Subtle clumped and linear enhancement seen in the  retroareolar region of the left breast, middle third measuring 0.8  (ap) x 1.0 (cc) x 0.6 (trv) cm with a clip at the posterior aspect  of the enhancement. 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  No other suspicious mass or enhancement seen in the left breast.  Postbiopsy changes are noted in the upper inner quadrant of the  right breast. No suspicious mass or enhancement seen in the right  breast. No axillary or internal mammary adenopathy is present.  IMPRESSION:  Known malignancy, left breast as detailed above. No MRI specific  evidence of malignancy, right breast   Assessment    DCIS, right breast    Plan    Right breast wire guided lumpectomy  We discussed possible sentinel node biopsy.  This is high grade but is small, er positive and we can return to do this if we need to.  I think small risk of lymphedema and arm symptoms for an active young woman make omitting this until final path available is best choice.  She may very well not need this.  She understands that she may need to return to the operating room for this and is agreeable. We discussed the staging and pathophysiology of breast cancer. We discussed all of the different options for treatment for breast cancer including surgery, chemotherapy, radiation therapy, Herceptin, and antiestrogen therapy.  We discussed the options for treatment of the breast cancer which included lumpectomy versus a mastectomy. We discussed the performance of the lumpectomy with a wire  placement. We discussed a 10-20% chance of a positive margin requiring reexcision in the operating room. We also discussed that she may need radiation therapy or antiestrogen therapy or both if she undergoes lumpectomy. We discussed the mastectomy and the postoperative care for that as well. We discussed that there is no difference in her survival whether she undergoes lumpectomy with radiation therapy or antiestrogen therapy versus a mastectomy. There is a slight difference in the local recurrence rate being 3-5% with lumpectomy and about 1% with a mastectomy. We discussed the risks of operation including bleeding, infection, possible reoperation. She understands her further therapy will be based on what her stages at the time of her operation.         Macayla Ekdahl 11/15/2012, 2:38 PM    

## 2012-11-19 ENCOUNTER — Other Ambulatory Visit (INDEPENDENT_AMBULATORY_CARE_PROVIDER_SITE_OTHER): Payer: Self-pay | Admitting: General Surgery

## 2012-11-19 DIAGNOSIS — C50211 Malignant neoplasm of upper-inner quadrant of right female breast: Secondary | ICD-10-CM

## 2012-11-20 ENCOUNTER — Encounter: Payer: Self-pay | Admitting: *Deleted

## 2012-11-20 ENCOUNTER — Encounter (HOSPITAL_BASED_OUTPATIENT_CLINIC_OR_DEPARTMENT_OTHER): Payer: Self-pay | Admitting: *Deleted

## 2012-11-20 ENCOUNTER — Telehealth: Payer: Self-pay | Admitting: *Deleted

## 2012-11-20 NOTE — Progress Notes (Signed)
Radiation Oncology         [phone] ________________________________  Name: Krista Butler MRN: [id number]  Date: 11/15/2012  DOB: 1950-10-26  WU:[id number] Hessie Diener, MD  [name], MD   Drue Second, MD  REFERRING PHYSICIAN: [name], MD   DIAGNOSIS: The encounter diagnosis was Cancer of upper-inner quadrant of female breast, right.   HISTORY OF PRESENT ILLNESS::Krista Butler is a 62 y.o. female who is seen for an initial consultation visit. The patient is and some thickening of the right breast and she was sent for further evaluation for this. She did not have any symptoms with regards to the left breast. The noted area of thickening did not show any discrete mass or distortion. The patient was found to have numerous bilateral calcifications. Within the left breast, there were new linear/branching calcifications measuring 12 mm. This was felt to be suspicious for possible malignancy. There also were some suspicious calcifications in a heterogeneous group measuring 1.1 cm within the upper inner quadrant of the right breast.  The patient proceeded to undergo stereotactic biopsy of both of these areas. The biopsy on the right was negative, demonstrating a fibroadenoma with associated coarse calcification. 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 The tumor was ER positive and PR negative.  The patient proceeded to undergo an MRI scan of the breast bilaterally. This showed a clot area of enhancement in the retroareolar region of the left breast. 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 No other suspicious areas seen and no axillary or internal mammary adenopathy present.  The patient is seen in multidisciplinary breast clinic today for discussion of possible options including adjuvant radiotherapy with the patient felt to be a good time candidate for breast  conservation treatment in conference this morning.   PREVIOUS RADIATION THERAPY: No   PAST MEDICAL HISTORY:  has a past medical history of Breast cancer; Thyroid disease; and Osteoporosis.     PAST SURGICAL HISTORY: Past Surgical History  Procedure Laterality Date  . Tonsillectomy       FAMILY HISTORY: family history is not on file.   SOCIAL HISTORY:  reports that she has quit smoking. She does not have any smokeless tobacco history on file. She reports that she drinks about 1.2 ounces of alcohol per week. She reports that she does not use illicit drugs.   ALLERGIES: Review of patient's allergies indicates no known allergies.   MEDICATIONS:  Current Outpatient Prescriptions  Medication Sig Dispense Refill  . fish oil-omega-3 fatty acids 1000 MG capsule Take 2 g by mouth daily.      Marland Kitchen levothyroxine (SYNTHROID, LEVOTHROID) 175 MCG tablet Take 175 mcg by mouth daily before breakfast.      . omeprazole (PRILOSEC) 20 MG capsule TAKE ONE CAPSULE BY MOUTH EVERY DAY 30 MINUTES BEFORE MEAL  30 capsule  0  . OVER THE COUNTER MEDICATION Take 50 mcg by mouth 3 (three) times daily with meals. Calcium with K2, taking 2 with each meal.      . vitamin C (ASCORBIC ACID) 500 MG tablet Take 500 mg by mouth daily. Taking 2 tab daily      . zinc gluconate 50 MG tablet Take 50 mg by mouth daily. 30 mg       No current facility-administered medications for this encounter.     REVIEW OF SYSTEMS:  A 15 point review of systems  is documented in the electronic medical record. This was obtained by the nursing staff. However, I reviewed this with the patient to discuss relevant findings and make appropriate changes.  Pertinent items are noted in HPI.    PHYSICAL EXAM:  vitals were not taken for this visit.  General: Well-developed, in no acute distress HEENT: Normocephalic, atraumatic; oral cavity clear Neck: Supple without any lymphadenopathy Cardiovascular: Regular rate and rhythm Respiratory:  Clear to auscultation bilaterally Breasts: No palpable masses or skin changes, no nipple changes in either breast. No axillary adenopathy present on either side GI: Soft, nontender, normal bowel sounds Extremities: No edema present Neuro: No focal deficits     LABORATORY DATA:  Lab Results  Component Value Date   WBC 4.7 11/15/2012   HGB 13.6 11/15/2012   HCT 39.4 11/15/2012   MCV 89.1 11/15/2012   PLT 232 11/15/2012   Lab Results  Component Value Date   NA 139 11/15/2012   K 3.9 11/15/2012   CL 102 11/15/2012   CO2 25 11/15/2012   Lab Results  Component Value Date   ALT 42 11/15/2012   AST 45* 11/15/2012   ALKPHOS 129 11/15/2012   BILITOT 0.37 11/15/2012      RADIOGRAPHY: US Breast Right  10/30/2012  *RADIOLOGY REPORT*  Clinical Data:  The patient notes palpable thickening within the lateral right breast.  There is no known family history of breast cancer.  DIGITAL DIAGNOSTIC BILATERAL MAMMOGRAM  WITH CAD AND BILATERAL BREAST ULTRASOUND:  Comparison:  05/06/2011, 01/12/2010, 11/06/2008.  Findings:  ACR Breast Density Category 3: The breast tissue is heterogeneously dense.  In the area of questionable palpable thickening, there is dense tissue without a discrete mass or distortion.  There are numerous bilateral predominately punctate calcifications.  However, located slightly laterally and centrally within the left breast are new linear / branching calcifications. These blend with adjacent punctate calcifications.  By mammography, these measure approximately 12 mm in greatest dimension.  Given the appearance, tissue sampling is recommended.  I have discussed stereotactic core biopsy of these calcifications with the patient.  This will be scheduled.  Also located within the upper inner quadrant of the right breast is a heterogeneous group of of relatively coarse calcifications measuring 1.1 cm in greatest dimension.  These have slightly increased when compared to prior studies.  Depending on the  results of the left breast stereotactic core biopsy, tissue sampling via stereotactic core biopsy of these calcifications may be needed.  Mammographic images were processed with CAD.  On physical exam,  there is thickening within the lateral right breast without a discrete palpable mass present.  Ultrasound is performed, showing prominent but normal-appearing fibroglandular tissue within the lateral right breast in the area of palpable thickening.  There is no mass, cyst, distortion, or worrisome shadowing.  IMPRESSION:  1.  Suspicious calcifications located slightly laterally and centrally within the left breast as discussed above.  Tissue sampling is recommended and stereotactic core biopsy will be scheduled. 2.  Heterogeneous group of calcifications located within the upper inner quadrant of the right breast.  Depending on the results of the initial left breast stereotactic core biopsy, tissue sampling of these calcifications may be needed, as well.  RECOMMENDATION:  Left breast stereotactic core biopsy and depending on the results of the initial biopsy, possibly right breast stereotactic core biopsy.  I have discussed the findings and recommendations with the patient. Results were also provided in writing at the conclusion of the visit.  If applicable,  a reminder letter will be sent to the patient regarding the next appointment.  BI-RADS CATEGORY 4:  Suspicious abnormality - biopsy should be considered.   Original Report Authenticated By: Rolla Plate, M.D.    Mr Breast Bilateral W Wo Contrast  11/10/2012  *RADIOLOGY REPORT*  Clinical Data: New diagnosis high-grade DCIS, left breast.  BUN and creatinine were obtained on site at [facility] at [address] Results:  BUN 14 mg/dL,  Creatinine 0.5 mg/dL.  BILATERAL BREAST MRI WITH AND WITHOUT CONTRAST  Technique: Multiplanar, multisequence MR images of both breasts were obtained prior to and following the intravenous administration of 13ml of  Multihance.  Three dimensional images were evaluated at the independent DynaCad workstation.  Comparison:  Multiple prior mammograms most recently dated 11/08/2012  Findings: Subtle clumped and linear enhancement seen in the retroareolar region of the left breast, middle third measuring 0.8 (ap) x 1.0 (cc) x 0.6 (trv) cm with a clip at the posterior aspect of the enhancement.  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 No other suspicious mass or enhancement seen in the left breast. Postbiopsy changes are noted in the upper inner quadrant of the right breast.  No suspicious mass or enhancement seen in the right breast.  No axillary or internal mammary adenopathy is present.  IMPRESSION: Known malignancy, left breast as detailed above.  No MRI specific evidence of malignancy, right breast.  RECOMMENDATION: Treatment plan, left breast.  THREE-DIMENSIONAL MR IMAGE RENDERING ON INDEPENDENT WORKSTATION:  Three-dimensional MR images were rendered by post-processing of the original MR data on an independent workstation.  The three- dimensional MR images were interpreted, and findings were reported in the accompanying complete MRI report for this study.  BI-RADS CATEGORY 6:  Known biopsy-proven malignancy - appropriate action should be taken.   Original Report Authenticated By: Vincenza Hews, M.D.    Mm Digital Diagnostic Bilat  10/30/2012  *RADIOLOGY REPORT*  Clinical Data:  The patient notes palpable thickening within the lateral right breast.  There is no known family history of breast cancer.  DIGITAL DIAGNOSTIC BILATERAL MAMMOGRAM  WITH CAD AND BILATERAL BREAST ULTRASOUND:  Comparison:  05/06/2011, 01/12/2010, 11/06/2008.  Findings:  ACR Breast Density Category 3: The breast tissue is heterogeneously dense.  In the area of questionable palpable thickening, there is dense tissue without a discrete mass or distortion.  There are numerous bilateral predominately punctate calcifications.  However, located slightly  laterally and centrally within the left breast are new linear / branching calcifications. These blend with adjacent punctate calcifications.  By mammography, these measure approximately 12 mm in greatest dimension.  Given the appearance, tissue sampling is recommended.  I have discussed stereotactic core biopsy of these calcifications with the patient.  This will be scheduled.  Also located within the upper inner quadrant of the right breast is a heterogeneous group of of relatively coarse calcifications measuring 1.1 cm in greatest dimension.  These have slightly increased when compared to prior studies.  Depending on the results of the left breast stereotactic core biopsy, tissue sampling via stereotactic core biopsy of these calcifications may be needed.  Mammographic images were processed with CAD.  On physical exam,  there is thickening within the lateral right breast without a discrete palpable mass present.  Ultrasound is performed, showing prominent but normal-appearing fibroglandular tissue within the lateral right breast in the area of palpable thickening.  There is no mass, cyst, distortion, or worrisome shadowing.  IMPRESSION:  1.  Suspicious calcifications located slightly  laterally and centrally within the left breast as discussed above.  Tissue sampling is recommended and stereotactic core biopsy will be scheduled. 2.  Heterogeneous group of calcifications located within the upper inner quadrant of the right breast.  Depending on the results of the initial left breast stereotactic core biopsy, tissue sampling of these calcifications may be needed, as well.  RECOMMENDATION:  Left breast stereotactic core biopsy and depending on the results of the initial biopsy, possibly right breast stereotactic core biopsy.  I have discussed the findings and recommendations with the patient. Results were also provided in writing at the conclusion of the visit.  If applicable, a reminder letter will be sent to the  patient regarding the next appointment.  BI-RADS CATEGORY 4:  Suspicious abnormality - biopsy should be considered.   Original Report Authenticated By: Rolla Plate, M.D.    Mm Radiologist Eval And Mgmt  11/07/2012  *RADIOLOGY REPORT*  ESTABLISHED PATIENT OFFICE VISIT - LEVEL II [phone])  Chief Complaint:  The patient returns for results following left breast stereotactic core biopsy.  History:  The patient underwent left breast stereotactic core biopsy for linear calcifications within the left breast.  Also, there are slightly suspicious calcifications located within the upper inner quadrant of the right breast.  There is no family history of breast cancer.  Exam:  The biopsy site within the left breast is clean and dry without hematoma formation or signs of infection.  Pathology: The pathology associated with the left breast stereotactic core biopsy demonstrates high-grade DCIS.  This is concordant with the imaging findings.  I have discussed the findings with the patient and her husband and answered their questions.  Assessment and Plan:  Recommended right breast stereotactic core biopsy of calcifications within the upper inner quadrant of the right breast.  This has been scheduled for 11/09/2012.  Recommend bilateral breast MRI which has been scheduled for 11/10/2012.  The patient has been scheduled for the breast cancer multidisciplinary clinic on 11/15/2012.  Educational material was shared with the patient and her husband.  Post biopsy wound care instructions were reviewed with the patient.  I have encouraged the patient to call the Breast Center for additional questions or concerns.   Original Report Authenticated By: Rolla Plate, M.D.    Mm Lt Breast Bx W Loc Dev 1st Lesion Image Bx Spec Stereo Guide  11/06/2012  *RADIOLOGY REPORT*  Clinical Data:  Suspicious left breast calcifications  STEREOTACTIC-GUIDED VACUUM ASSISTED BIOPSY OF THE LEFT BREAST AND SPECIMEN RADIOGRAPH  Comparison: Previous  exams.  I met with the patient and we discussed the procedure of stereotactic-guided biopsy, including benefits and alternatives. We discussed the high likelihood of a successful procedure. We discussed the risks of the procedure, including infection, bleeding, tissue injury, clip migration, and inadequate sampling. Informed, written consent was given.  Using sterile technique, 2% lidocaine, stereotactic guidance, and a 9 gauge vacuum assisted device, biopsy was performed of calcifications in the lateral aspect of the left breast using  an inferior approach.  Specimen radiograph was performed, showing calcifications in the tissue samples.  Specimens with calcifications are identified for pathology.  At the conclusion of the procedure, a T-shaped tissue marker clip was deployed into the biopsy cavity.  Follow-up 2-view mammogram confirmed clip placement.  IMPRESSION: Stereotactic-guided biopsy of the left breast.  No apparent complications.   Original Report Authenticated By: Baird Lyons, M.D.    Mm Rt Breast Bx W Loc Dev 1st Lesion Image Bx Spec Stereo Guide  11/09/2012  **  ADDENDUM** CREATED: 11/09/2012 13:20:05  The patient was called by myself Dr. Micheline Maze.  The patient states she is doing well since her right breast biopsy without problems at the biopsy site.  The patient was given the results of her right breast biopsy which was compatible with a fibroadenoma with microcalcifications.  This is felt to be concordant with the imaging findings.  Patient's immediate questions and concerns were addressed.  The patient has a recent history of a positive left breast stereotactic needle biopsy compatible with high-grade DCIS. The patient will undergo MRI 11/10/2012 and will be seen at the [facility] 11/15/2012.  **END ADDENDUM** SIGNED BY: Melton Alar. Micheline Maze, M.D.   11/08/2012  *RADIOLOGY REPORT*  Clinical Data:  Patient presents for stereotactic core needle biopsy of a group of indeterminate microcalcifications over the inner  upper right breast.  The patient has a recent history of a positive left breast stereotactic biopsy demonstrating high-grade DCIS  11/06/2012.  STEREOTACTIC-GUIDED VACUUM ASSISTED BIOPSY OF THE RIGHT BREAST AND SPECIMEN RADIOGRAPH  Comparison: Previous exams.  I met with the patient and we discussed the procedure of stereotactic-guided biopsy, including benefits and alternatives. We discussed the high likelihood of a successful procedure. We discussed the risks of the procedure, including infection, bleeding, tissue injury, clip migration, and inadequate sampling. Informed, written consent was given.  Using sterile technique, 2% lidocaine, stereotactic guidance, and a 9 gauge vacuum assisted device, biopsy was performed of the targeted right breast microcalcifications using a medial to lateral approach.  Specimen radiograph was performed, showing multiple of the targeted microcalcifications.  Specimens with calcifications are identified for pathology.  At the conclusion of the procedure, a  cylinder shaped tissue marker clip was deployed into the biopsy cavity.  Follow-up 2-view mammogram confirmed clip placement accurately at the biopsy site.  IMPRESSION: Stereotactic-guided biopsy of indeterminate right breast microcalcifications.  No apparent complications.   Original Report Authenticated By: Elberta Fortis, M.D.        IMPRESSION: The patient is a pleasant 62 year old female with a recent diagnosis of DCIS of the left breast. She is an appropriate candidate for breast conservation treatment.  I discussed with the patient the role of radiotherapy in this setting. I would anticipate a likely 6-1/2 week course of radiotherapy adjuvantly. We discussed the expected potential benefit of such a treatment as well as the risks and side effects. All of her questions were answered. The patient is interested in breast conservation treatment. She is also seeing medical oncology today for discussion of anti-hormonal  treatment.   PLAN:  -Left-sided lumpectomy as scheduled by Dr. Dwain Sarna.  -The patient will be seen postoperatively for further discussion and coordination of an anticipated course of adjuvant radiotherapy. We will review her final pathology results at that time.  -It is anticipated that she will also proceed with anti-hormonal treatment after radiotherapy.    I spent 60 minutes minutes face to face with the patient and more than 50% of that time was spent in counseling and/or coordination of care.    ________________________________   [name], MD, PhD

## 2012-11-20 NOTE — Telephone Encounter (Signed)
Spoke to pt concerning [facility] from 11/15/12.  Pt denies questions or concerns regarding dx or treatment care plan.  Encourage pt to call with needs. Received verbal understanding.  Contact information given.

## 2012-11-20 NOTE — Progress Notes (Signed)
[facility] Psychosocial Distress Screening Clinical Social Work  Patient completed distress screening protocol, and scored a 5 on the Psychosocial Distress Thermometer which indicates moderate distress. Clinical Social Worker met with pt in [facility] to assess for distress and other psychosocial needs.  Pt stated she felt "better" after speaking with the physicians and getting more information on her treatment plan.  CSW informed pt of the support team and support services at [facility], and encouraged pt to call with any additional questions or concerns.    Tamala Julian, MSW, LCSW Clinical Social Worker [facility] [phone]

## 2012-11-20 NOTE — Telephone Encounter (Signed)
sw pt gv appt d/t for 12/08/12 @ 12:30pm. She is aware of her appts. i also made her aware that i would place a cal/letter in the mail do to she was driving at the time...td

## 2012-11-20 NOTE — Progress Notes (Signed)
Labs done cc-4/14-no ekg needed

## 2012-11-22 ENCOUNTER — Telehealth (INDEPENDENT_AMBULATORY_CARE_PROVIDER_SITE_OTHER): Payer: Self-pay

## 2012-11-22 NOTE — Telephone Encounter (Signed)
Called [facility] to get pt's colonoscopy rescheduled from Feb.2014. They told me Dr Buccinni's assistant will call pt directly to get this rescheduled for the pt. I gave them the pt's cell#. Their office will take care of this for me.

## 2012-11-22 NOTE — Telephone Encounter (Signed)
Error on this message below it was meant for another pt's chart.

## 2012-11-23 ENCOUNTER — Encounter (HOSPITAL_BASED_OUTPATIENT_CLINIC_OR_DEPARTMENT_OTHER): Payer: Self-pay | Admitting: Certified Registered Nurse Anesthetist

## 2012-11-23 ENCOUNTER — Ambulatory Visit
Admission: RE | Admit: 2012-11-23 | Discharge: 2012-11-23 | Disposition: A | Discharge status: Home / Self Care | Payer: BC Managed Care – PPO | Source: Ambulatory Visit | Attending: General Surgery | Admitting: General Surgery

## 2012-11-23 ENCOUNTER — Ambulatory Visit (HOSPITAL_BASED_OUTPATIENT_CLINIC_OR_DEPARTMENT_OTHER): Payer: BC Managed Care – PPO | Admitting: Certified Registered Nurse Anesthetist

## 2012-11-23 ENCOUNTER — Encounter (HOSPITAL_BASED_OUTPATIENT_CLINIC_OR_DEPARTMENT_OTHER)
Admission: RE | Disposition: A | Discharge status: Home / Self Care | Payer: Self-pay | Source: Ambulatory Visit | Attending: General Surgery

## 2012-11-23 ENCOUNTER — Ambulatory Visit (HOSPITAL_BASED_OUTPATIENT_CLINIC_OR_DEPARTMENT_OTHER)
Admission: RE | Admit: 2012-11-23 | Discharge: 2012-11-23 | Disposition: A | Discharge status: Home / Self Care | Payer: BC Managed Care – PPO | Source: Ambulatory Visit | Attending: General Surgery | Admitting: General Surgery

## 2012-11-23 DIAGNOSIS — D0590 Unspecified type of carcinoma in situ of unspecified breast: Secondary | ICD-10-CM | POA: Insufficient documentation

## 2012-11-23 DIAGNOSIS — M810 Age-related osteoporosis without current pathological fracture: Secondary | ICD-10-CM | POA: Insufficient documentation

## 2012-11-23 DIAGNOSIS — C50211 Malignant neoplasm of upper-inner quadrant of right female breast: Secondary | ICD-10-CM

## 2012-11-23 DIAGNOSIS — E079 Disorder of thyroid, unspecified: Secondary | ICD-10-CM | POA: Insufficient documentation

## 2012-11-23 DIAGNOSIS — K219 Gastro-esophageal reflux disease without esophagitis: Secondary | ICD-10-CM | POA: Insufficient documentation

## 2012-11-23 DIAGNOSIS — Z87891 Personal history of nicotine dependence: Secondary | ICD-10-CM | POA: Insufficient documentation

## 2012-11-23 HISTORY — DX: Presence of spectacles and contact lenses: Z97.3

## 2012-11-23 HISTORY — PX: BREAST LUMPECTOMY WITH NEEDLE LOCALIZATION: SHX5759

## 2012-11-23 SURGERY — BREAST LUMPECTOMY WITH NEEDLE LOCALIZATION
Anesthesia: General | Site: Breast | Laterality: Left | Wound class: Clean

## 2012-11-23 MED ORDER — HYDROMORPHONE HCL PF 1 MG/ML IJ SOLN
0.2500 mg | INTRAMUSCULAR | Status: DC | PRN
  Administered 2012-11-23: 0.25 mg via INTRAVENOUS
  Administered 2012-11-23: 0.5 mg via INTRAVENOUS

## 2012-11-23 MED ORDER — CEFAZOLIN SODIUM-DEXTROSE 2-3 GM-% IV SOLR
2.0000 g | INTRAVENOUS | Status: AC
  Administered 2012-11-23: 2 g via INTRAVENOUS

## 2012-11-23 MED ORDER — MIDAZOLAM HCL 2 MG/2ML IJ SOLN: 1.0000 mg | INTRAMUSCULAR | Status: DC | PRN

## 2012-11-23 MED ORDER — 0.9 % SODIUM CHLORIDE (POUR BTL) OPTIME
TOPICAL | Status: DC | PRN
  Administered 2012-11-23: 300 mL

## 2012-11-23 MED ORDER — OXYCODONE HCL 5 MG PO TABS
5.0000 mg | ORAL_TABLET | Freq: Once | ORAL | Status: AC | PRN
  Administered 2012-11-23: 5 mg via ORAL

## 2012-11-23 MED ORDER — LIDOCAINE HCL (CARDIAC) 20 MG/ML IV SOLN
INTRAVENOUS | Status: DC | PRN
  Administered 2012-11-23: 60 mg via INTRAVENOUS

## 2012-11-23 MED ORDER — PHENYLEPHRINE HCL 10 MG/ML IJ SOLN
INTRAMUSCULAR | Status: DC | PRN
  Administered 2012-11-23: 40 ug via INTRAVENOUS

## 2012-11-23 MED ORDER — BUPIVACAINE HCL (PF) 0.25 % IJ SOLN
INTRAMUSCULAR | Status: DC | PRN
  Administered 2012-11-23: 10 mL

## 2012-11-23 MED ORDER — ACETAMINOPHEN 10 MG/ML IV SOLN
1000.0000 mg | Freq: Once | INTRAVENOUS | Status: AC
  Administered 2012-11-23: 1000 mg via INTRAVENOUS

## 2012-11-23 MED ORDER — SCOPOLAMINE 1 MG/3DAYS TD PT72
1.0000 | MEDICATED_PATCH | TRANSDERMAL | Status: DC
  Administered 2012-11-23: 1 via TRANSDERMAL

## 2012-11-23 MED ORDER — LACTATED RINGERS IV SOLN
INTRAVENOUS | Status: DC
  Administered 2012-11-23 (×2): via INTRAVENOUS

## 2012-11-23 MED ORDER — DEXAMETHASONE SODIUM PHOSPHATE 4 MG/ML IJ SOLN
INTRAMUSCULAR | Status: DC | PRN
  Administered 2012-11-23: 10 mg via INTRAVENOUS

## 2012-11-23 MED ORDER — OXYCODONE-ACETAMINOPHEN 5-325 MG PO TABS: 1.0000 | ORAL_TABLET | ORAL | Status: DC | PRN

## 2012-11-23 MED ORDER — MIDAZOLAM HCL 5 MG/5ML IJ SOLN
INTRAMUSCULAR | Status: DC | PRN
  Administered 2012-11-23: 1 mg via INTRAVENOUS

## 2012-11-23 MED ORDER — ONDANSETRON HCL 4 MG/2ML IJ SOLN: 4.0000 mg | Freq: Once | INTRAMUSCULAR | Status: DC | PRN

## 2012-11-23 MED ORDER — FENTANYL CITRATE 0.05 MG/ML IJ SOLN: 50.0000 ug | INTRAMUSCULAR | Status: DC | PRN

## 2012-11-23 MED ORDER — PROPOFOL 10 MG/ML IV BOLUS
INTRAVENOUS | Status: DC | PRN
  Administered 2012-11-23: 150 mg via INTRAVENOUS

## 2012-11-23 MED ORDER — FENTANYL CITRATE 0.05 MG/ML IJ SOLN
INTRAMUSCULAR | Status: DC | PRN
  Administered 2012-11-23: 50 ug via INTRAVENOUS

## 2012-11-23 MED ORDER — OXYCODONE HCL 5 MG/5ML PO SOLN: 5.0000 mg | Freq: Once | ORAL | Status: AC | PRN

## 2012-11-23 MED ORDER — ONDANSETRON HCL 4 MG/2ML IJ SOLN
INTRAMUSCULAR | Status: DC | PRN
  Administered 2012-11-23: 4 mg via INTRAVENOUS

## 2012-11-23 SURGICAL SUPPLY — 57 items
ADH SKN CLS APL DERMABOND .7 (GAUZE/BANDAGES/DRESSINGS) ×1
APL SKNCLS STERI-STRIP NONHPOA (GAUZE/BANDAGES/DRESSINGS)
APPLIER CLIP 9.375 MED OPEN (MISCELLANEOUS) ×2
APR CLP MED 9.3 20 MLT OPN (MISCELLANEOUS) ×1
BENZOIN TINCTURE PRP APPL 2/3 (GAUZE/BANDAGES/DRESSINGS) ×1 IMPLANT
BINDER BREAST LRG (GAUZE/BANDAGES/DRESSINGS) IMPLANT
BINDER BREAST MEDIUM (GAUZE/BANDAGES/DRESSINGS) ×1 IMPLANT
BINDER BREAST XLRG (GAUZE/BANDAGES/DRESSINGS) IMPLANT
BINDER BREAST XXLRG (GAUZE/BANDAGES/DRESSINGS) IMPLANT
BLADE SURG 15 STRL LF DISP TIS (BLADE) ×1 IMPLANT
BLADE SURG 15 STRL SS (BLADE) ×2
CANISTER SUCTION 1200CC (MISCELLANEOUS) ×1 IMPLANT
CHLORAPREP W/TINT 26ML (MISCELLANEOUS) ×2 IMPLANT
CLIP APPLIE 9.375 MED OPEN (MISCELLANEOUS) IMPLANT
CLOTH BEACON ORANGE TIMEOUT ST (SAFETY) ×2 IMPLANT
COVER MAYO STAND STRL (DRAPES) ×2 IMPLANT
COVER TABLE BACK 60X90 (DRAPES) ×2 IMPLANT
DECANTER SPIKE VIAL GLASS SM (MISCELLANEOUS) ×1 IMPLANT
DERMABOND ADVANCED (GAUZE/BANDAGES/DRESSINGS) ×1
DERMABOND ADVANCED .7 DNX12 (GAUZE/BANDAGES/DRESSINGS) IMPLANT
DEVICE DUBIN W/COMP PLATE 8390 (MISCELLANEOUS) ×1 IMPLANT
DRAPE LAPAROSCOPIC ABDOMINAL (DRAPES) IMPLANT
DRAPE PED LAPAROTOMY (DRAPES) ×2 IMPLANT
DRSG TEGADERM 4X4.75 (GAUZE/BANDAGES/DRESSINGS) ×1 IMPLANT
ELECT COATED BLADE 2.86 ST (ELECTRODE) ×2 IMPLANT
ELECT REM PT RETURN 9FT ADLT (ELECTROSURGICAL) ×2
ELECTRODE REM PT RTRN 9FT ADLT (ELECTROSURGICAL) ×1 IMPLANT
GAUZE SPONGE 4X4 12PLY STRL LF (GAUZE/BANDAGES/DRESSINGS) ×2 IMPLANT
GLOVE BIO SURGEON STRL SZ7 (GLOVE) ×4 IMPLANT
GLOVE BIOGEL PI IND STRL 7.0 (GLOVE) IMPLANT
GLOVE BIOGEL PI IND STRL 7.5 (GLOVE) ×1 IMPLANT
GLOVE BIOGEL PI INDICATOR 7.0 (GLOVE) ×1
GLOVE BIOGEL PI INDICATOR 7.5 (GLOVE) ×1
GOWN PREVENTION PLUS XLARGE (GOWN DISPOSABLE) ×3 IMPLANT
KIT MARKER MARGIN INK (KITS) ×1 IMPLANT
NDL HYPO 25X1 1.5 SAFETY (NEEDLE) ×1 IMPLANT
NEEDLE HYPO 25X1 1.5 SAFETY (NEEDLE) ×2 IMPLANT
NS IRRIG 1000ML POUR BTL (IV SOLUTION) ×1 IMPLANT
PACK BASIN DAY SURGERY FS (CUSTOM PROCEDURE TRAY) ×2 IMPLANT
PENCIL BUTTON HOLSTER BLD 10FT (ELECTRODE) ×2 IMPLANT
SLEEVE SCD COMPRESS KNEE MED (MISCELLANEOUS) ×2 IMPLANT
SPONGE LAP 4X18 X RAY DECT (DISPOSABLE) ×2 IMPLANT
STRIP CLOSURE SKIN 1/2X4 (GAUZE/BANDAGES/DRESSINGS) ×2 IMPLANT
SUT MNCRL AB 4-0 PS2 18 (SUTURE) ×1 IMPLANT
SUT MON AB 5-0 PS2 18 (SUTURE) ×1 IMPLANT
SUT SILK 2 0 SH (SUTURE) ×1 IMPLANT
SUT VIC AB 2-0 SH 27 (SUTURE) ×2
SUT VIC AB 2-0 SH 27XBRD (SUTURE) ×1 IMPLANT
SUT VIC AB 3-0 SH 27 (SUTURE) ×2
SUT VIC AB 3-0 SH 27X BRD (SUTURE) ×1 IMPLANT
SUT VIC AB 5-0 PS2 18 (SUTURE) IMPLANT
SUT VICRYL AB 3 0 TIES (SUTURE) IMPLANT
SYR CONTROL 10ML LL (SYRINGE) ×2 IMPLANT
TOWEL OR 17X24 6PK STRL BLUE (TOWEL DISPOSABLE) ×2 IMPLANT
TOWEL OR NON WOVEN STRL DISP B (DISPOSABLE) ×2 IMPLANT
TUBE CONNECTING 20X1/4 (TUBING) ×1 IMPLANT
YANKAUER SUCT BULB TIP NO VENT (SUCTIONS) ×1 IMPLANT

## 2012-11-23 NOTE — Interval H&P Note (Signed)
History and Physical Interval Note:  11/23/2012 12:25 PM  Krista Butler  has presented today for surgery, with the diagnosis of left breast cancer  The various methods of treatment have been discussed with the patient and family. After consideration of risks, benefits and other options for treatment, the patient has consented to  Procedure(s): BREAST LUMPECTOMY WITH NEEDLE LOCALIZATION (Left) as a surgical intervention .  The patient's history has been reviewed, patient examined, no change in status, stable for surgery.  I have reviewed the patient's chart and labs.  Questions were answered to the patient's satisfaction.     [name]

## 2012-11-23 NOTE — Transfer of Care (Signed)
Immediate Anesthesia Transfer of Care Note  Patient: Krista Butler  Procedure(s) Performed: Procedure(s): BREAST LUMPECTOMY WITH NEEDLE LOCALIZATION (Left)  Patient Location: PACU  Anesthesia Type:General  Level of Consciousness: awake, alert , oriented and patient cooperative  Airway & Oxygen Therapy: Patient Spontanous Breathing and Patient connected to face mask oxygen  Post-op Assessment: Report given to PACU RN and Post -op Vital signs reviewed and stable  Post vital signs: Reviewed and stable  Complications: No apparent anesthesia complications

## 2012-11-23 NOTE — Op Note (Signed)
Preoperative diagnosis: Left breast ductal carcinoma in situ Postoperative diagnosis: SAA Procedure: left breast wire guided lumpectomy Surgeon: Dr Harden Mo  Anes: General with LMA Specimen: left breast marked with paint EBL: minimal Drains: none Complications: none Sponge and needle count correct times two Disposition to recover stable  Indications: This is a 52 yof who had screening mm with left breast calcifications.  Biopsy shows dcis.  She was seen in multidisciplinary clinic.  We discussed all our options and decided to proceed with left breast wire guided lumpectomy.  Procedure: After informed consent was obtained, patient was taken to OR. She previously had a wire placed at the breast center. I had these mammograms available in OR. She was given 2 grams of cefazolin.  SCDs were in place. She was then placed under general anesthesia with an LMA.  Her left breast was prepped and draped in the standard sterile surgical fashion.  A surgical timeout was performed.  I made a periareolar incision.  I brought the wire in from remotely.  I then used cautery to excise the wire and all the surrounding tissue. This was done with an effort to get a margin. I did a mammogram and confirmed removal of the clip, wire, and the lesion. This was confirmed by radiology. I placed 2 clips deep and one clip in each position around the cavity. I then obtained hemostasis. I closed the breast tissue with 2-0 Vicryl. This closed down the cavity. I then closed the dermis with 3-0 Vicryl and the skin with 5-0 Monocryl. I then infiltrated Marcaine throughout the wound. I then placed Dermabond and Steri-Strips. A breast binder was placed. She tolerated this well was extubated and transferred to recovery stable.

## 2012-11-23 NOTE — Anesthesia Procedure Notes (Signed)
Procedure Name: LMA Insertion Date/Time: 11/23/2012 12:45 PM Performed by: Jasir Rother D Pre-anesthesia Checklist: Patient identified, Emergency Drugs available, Suction available and Patient being monitored Patient Re-evaluated:Patient Re-evaluated prior to inductionOxygen Delivery Method: Circle System Utilized Preoxygenation: Pre-oxygenation with 100% oxygen Intubation Type: IV induction Ventilation: Mask ventilation without difficulty LMA: LMA inserted LMA Size: 4.0 Number of attempts: 1 Airway Equipment and Method: bite block Placement Confirmation: positive ETCO2 Tube secured with: Tape Dental Injury: Teeth and Oropharynx as per pre-operative assessment

## 2012-11-23 NOTE — Interval H&P Note (Signed)
History and Physical Interval Note:  11/23/2012 12:33 PM My initial h and p states right side and this is error.  This is left breast lumpectomy for dcis.  Krista Butler  has presented today for surgery, with the diagnosis of left breast cancer  The various methods of treatment have been discussed with the patient and family. After consideration of risks, benefits and other options for treatment, the patient has consented to  Procedure(s): BREAST LUMPECTOMY WITH NEEDLE LOCALIZATION (Left) as a surgical intervention .  The patient's history has been reviewed, patient examined, no change in status, stable for surgery.  I have reviewed the patient's chart and labs.  Questions were answered to the patient's satisfaction.     Annlouise Gerety

## 2012-11-23 NOTE — H&P (View-Only) (Signed)
Patient ID: Krista Butler, female   DOB: 1950-08-20, 62 y.o.   MRN: [id number]  Chief Complaint  Patient presents with  . Other    breast cancer    HPI Krista Butler is a 62 y.o. female.  Referred by Dr Richarda Overlie HPI 38 yof who had right breast thickening and pain and underwent evaluation with mm.  She did not have any left breast symptoms.  She has had a left breast cyst excised in the past.  Her mm in area of questionable thickening shows no discrete mass or distortion.  She has numerous bilateral calcifications.  However located slightly laterally and centrally within the left breast there are new linear/branching calcifications.  These measure 12 mm by mammography.  There is also in upper inner quadrant of the right breast a heterogenous group of calcifications measuring 1.1 cm.  Stereotactic biopsy of both sides were performed.  MR shows subtle clumped and linear enhancement seen in the retroareolar of the left breast measuring 0.8x1x0.6cm.  There are no suspicious areas in the left breast.  There is no adenopathy. She comes in to discuss her options.   Past Medical History  Diagnosis Date  . Breast cancer   . Thyroid disease   . Osteoporosis     Past Surgical History  Procedure Laterality Date  . Tonsillectomy      History reviewed. No pertinent family history.  Social History History  Substance Use Topics  . Smoking status: Former Games developer  . Smokeless tobacco: Not on file  . Alcohol Use: 1.2 oz/week    2 Glasses of wine per week    No Known Allergies  Current Outpatient Prescriptions  Medication Sig Dispense Refill  . fish oil-omega-3 fatty acids 1000 MG capsule Take 2 g by mouth daily.      Marland Kitchen levothyroxine (SYNTHROID, LEVOTHROID) 175 MCG tablet Take 175 mcg by mouth daily before breakfast.      . omeprazole (PRILOSEC) 20 MG capsule TAKE ONE CAPSULE BY MOUTH EVERY DAY 30 MINUTES BEFORE MEAL  30 capsule  0  . OVER THE COUNTER MEDICATION Take 50 mcg by mouth 3  (three) times daily with meals. Calcium with K2, taking 2 with each meal.      . vitamin C (ASCORBIC ACID) 500 MG tablet Take 500 mg by mouth daily. Taking 2 tab daily      . zinc gluconate 50 MG tablet Take 50 mg by mouth daily. 30 mg       No current facility-administered medications for this visit.    Review of Systems Review of Systems  Constitutional: Negative for fever, chills and unexpected weight change.  HENT: Negative for hearing loss, congestion, sore throat, trouble swallowing and voice change.   Eyes: Negative for visual disturbance.  Respiratory: Negative for cough and wheezing.   Cardiovascular: Negative for chest pain, palpitations and leg swelling.  Gastrointestinal: Negative for nausea, vomiting, abdominal pain, diarrhea, constipation, blood in stool, abdominal distention and anal bleeding.  Genitourinary: Negative for hematuria, vaginal bleeding and difficulty urinating.  Musculoskeletal: Negative for arthralgias.  Skin: Negative for rash and wound.  Neurological: Negative for seizures, syncope and headaches.  Hematological: Negative for adenopathy. Bruises/bleeds easily.  Psychiatric/Behavioral: Negative for confusion.    There were no vitals taken for this visit.  Physical Exam Physical Exam  Vitals reviewed. Constitutional: She appears well-developed and well-nourished.  Eyes: No scleral icterus.  Cardiovascular: Normal rate, regular rhythm and normal heart sounds.   Pulmonary/Chest: Effort normal and  breath sounds normal. She has no wheezes. She has no rales. Right breast exhibits no inverted nipple, no mass, no nipple discharge, no skin change and no tenderness. Left breast exhibits no inverted nipple, no mass, no nipple discharge, no skin change and no tenderness. Breasts are symmetrical.  Lymphadenopathy:    She has no cervical adenopathy.    She has no axillary adenopathy.       Right: No supraclavicular adenopathy present.       Left: No supraclavicular  adenopathy present.    Data Reviewed BILATERAL BREAST MRI WITH AND WITHOUT CONTRAST  Technique: Multiplanar, multisequence MR images of both breasts  were obtained prior to and following the intravenous administration  of 13ml of Multihance. Three dimensional images were evaluated at  the independent DynaCad workstation.  Comparison: Multiple prior mammograms most recently dated  11/08/2012  Findings: Subtle clumped and linear enhancement seen in the  retroareolar region of the left breast, middle third measuring 0.8  (ap) x 1.0 (cc) x 0.6 (trv) cm with a clip at the posterior aspect  of the enhancement. 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  No other suspicious mass or enhancement seen in the left breast.  Postbiopsy changes are noted in the upper inner quadrant of the  right breast. No suspicious mass or enhancement seen in the right  breast. No axillary or internal mammary adenopathy is present.  IMPRESSION:  Known malignancy, left breast as detailed above. No MRI specific  evidence of malignancy, right breast   Assessment    DCIS, right breast    Plan    Right breast wire guided lumpectomy  We discussed possible sentinel node biopsy.  This is high grade but is small, er positive and we can return to do this if we need to.  I think small risk of lymphedema and arm symptoms for an active young woman make omitting this until final path available is best choice.  She may very well not need this.  She understands that she may need to return to the operating room for this and is agreeable. We discussed the staging and pathophysiology of breast cancer. We discussed all of the different options for treatment for breast cancer including surgery, chemotherapy, radiation therapy, Herceptin, and antiestrogen therapy.  We discussed the options for treatment of the breast cancer which included lumpectomy versus a mastectomy. We discussed the performance of the lumpectomy with a wire  placement. We discussed a 10-20% chance of a positive margin requiring reexcision in the operating room. We also discussed that she may need radiation therapy or antiestrogen therapy or both if she undergoes lumpectomy. We discussed the mastectomy and the postoperative care for that as well. We discussed that there is no difference in her survival whether she undergoes lumpectomy with radiation therapy or antiestrogen therapy versus a mastectomy. There is a slight difference in the local recurrence rate being 3-5% with lumpectomy and about 1% with a mastectomy. We discussed the risks of operation including bleeding, infection, possible reoperation. She understands her further therapy will be based on what her stages at the time of her operation.         Clarrisa Kaylor 11/15/2012, 2:38 PM

## 2012-11-23 NOTE — Anesthesia Postprocedure Evaluation (Signed)
  Anesthesia Post-op Note  Patient: Krista Butler  Procedure(s) Performed: Procedure(s): BREAST LUMPECTOMY WITH NEEDLE LOCALIZATION (Left)  Patient Location: PACU  Anesthesia Type:General  Level of Consciousness: awake, alert  and oriented  Airway and Oxygen Therapy: Patient Spontanous Breathing and Patient connected to face mask oxygen  Post-op Pain: mild  Post-op Assessment: Post-op Vital signs reviewed  Post-op Vital Signs: Reviewed  Complications: No apparent anesthesia complications

## 2012-11-23 NOTE — Anesthesia Preprocedure Evaluation (Signed)
Anesthesia Evaluation  Patient identified by MRN, date of birth, ID band Patient awake    Reviewed: Allergy & Precautions, H&P , NPO status , Patient's Chart, lab work & pertinent test results  Airway Mallampati: I TM Distance: >3 FB Neck ROM: Full    Dental  (+) Teeth Intact and Dental Advisory Given   Pulmonary  breath sounds clear to auscultation        Cardiovascular Rhythm:Regular Rate:Normal     Neuro/Psych    GI/Hepatic GERD-  Medicated and Controlled,  Endo/Other    Renal/GU      Musculoskeletal   Abdominal   Peds  Hematology   Anesthesia Other Findings   Reproductive/Obstetrics                           Anesthesia Physical Anesthesia Plan  ASA: I  Anesthesia Plan: General   Post-op Pain Management:    Induction: Intravenous  Airway Management Planned: LMA  Additional Equipment:   Intra-op Plan:   Post-operative Plan: Extubation in OR  Informed Consent: I have reviewed the patients History and Physical, chart, labs and discussed the procedure including the risks, benefits and alternatives for the proposed anesthesia with the patient or authorized representative who has indicated his/her understanding and acceptance.     Plan Discussed with: CRNA, Anesthesiologist and Surgeon  Anesthesia Plan Comments:         Anesthesia Quick Evaluation

## 2012-11-24 ENCOUNTER — Encounter: Payer: Self-pay | Admitting: *Deleted

## 2012-11-24 ENCOUNTER — Encounter (HOSPITAL_BASED_OUTPATIENT_CLINIC_OR_DEPARTMENT_OTHER): Payer: Self-pay | Admitting: General Surgery

## 2012-11-24 NOTE — Progress Notes (Signed)
Mailed after appt letter to pt. 

## 2012-12-03 NOTE — Progress Notes (Signed)
SHRON OZER [id number] April 21, 1951 62 y.o. 12/03/2012 5:39 PM  CC  [name], MD [address] [place] [address] Dr. Dorothy Puffer Dr. 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 Patient is seen in the multidisciplinary breast clinic for discussion of treatment options.  STAGE:   Cancer of upper-inner quadrant of female breast   Primary site: Breast (Left)   Staging method: AJCC 7th Edition   Clinical: Stage 0 (Tis (DCIS), N0, cM0)   Summary: Stage 0 (Tis (DCIS), N0, cM0)  REFERRING PHYSICIAN: Dr. [name]  HISTORY OF PRESENT ILLNESS:  AUTYMN OMLOR is a 62 y.o. female.  Who noted some thickening of the right breast. She did not have any symptoms in the left breast. The thickening did not show any discrete mass or distortion on the right. However the patient was found to have numerous bilateral calcifications. Within the left breast there was a new linear/branching calcifications measuring 12 mm. This was suspicious for malignancy. There are also 1.1 cm suspicious calcifications in a heterogeneous group within the upper inner quadrant of the right breast. She underwent stereotactic biopsy of both of these areas. The biopsy on the right was negative and only noted to be a fibroadenoma with associated coarse calcifications. 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 No other suspicious areas were seen and no axillary internal adenopathy. Patient's case was discussed at the multidisciplinary breast conference. Both the radiology and pathology were reviewed. She is now seen in the multidisciplinary breast clinic for discussion of treatment  options. She is also seen by Dr. Dorothy Puffer and Dr. [name].   Past Medical History: Past Medical History  Diagnosis Date  . Breast cancer   . Thyroid disease   . Osteoporosis   . Wears glasses     Past Surgical History: Past Surgical History  Procedure Laterality Date  . Tonsillectomy    . Upper gi endoscopy    . Colonoscopy    . Fracture surgery      lt arm as child  . Breast biopsy  1997    lt breast cyst  . Breast lumpectomy with needle localization Left 11/23/2012    Procedure: BREAST LUMPECTOMY WITH NEEDLE LOCALIZATION;  Surgeon: [name], MD;  Location: [facility];  Service: General;  Laterality: Left;    Family History: No family history on file.  Social History History  Substance Use Topics  . Smoking status: Former Games developer  . Smokeless tobacco: Not on file  . Alcohol Use: 1.2 oz/week    2 Glasses of wine per week     Comment: occ    Allergies: Allergies  Allergen Reactions  . Sulfa Antibiotics Cough    Current Medications: Current Outpatient Prescriptions  Medication Sig Dispense Refill  . fish oil-omega-3 fatty acids 1000 MG capsule Take 2 g by mouth daily.      Marland Kitchen levothyroxine (SYNTHROID, LEVOTHROID) 175 MCG tablet Take 175 mcg by mouth daily before breakfast.      . omeprazole (PRILOSEC) 20 MG capsule TAKE ONE CAPSULE BY MOUTH EVERY DAY 30 MINUTES BEFORE MEAL  30 capsule  0  . OVER THE COUNTER MEDICATION Take 50 mcg by mouth  3 (three) times daily with meals. Calcium with K2, taking 2 with each meal.      . vitamin C (ASCORBIC ACID) 500 MG tablet Take 500 mg by mouth daily. Taking 2 tab daily      . zinc gluconate 50 MG tablet Take 50 mg by mouth daily. 30 mg      . oxyCODONE-acetaminophen (ROXICET) 5-325 MG per tablet Take 1 tablet by mouth every 4 (four) hours as needed for pain.  20 tablet  0   No current facility-administered medications for this visit.    OB/GYN History:menarche at age 19 last menstrual cycle  was in 2000. She has never been on hormone replacement therapy. She has had to live births first live birth was at 71.  Fertility Discussion:patient has completed her family Prior History of Cancer:no prior history of malignancy  Health Maintenance:  Colonoscopy2007 Bone Density2013 Last PAP smear03/26/2014  ECOG PERFORMANCE STATUS: 0 - Asymptomatic  Genetic Counseling/testing:no  REVIEW OF SYSTEMS: Comprehensive review of systems was obtained and it is scant separately into the electronic medical record  PHYSICAL EXAMINATION: Blood pressure 101/67, pulse 79, temperature 97.9 F (36.6 C), temperature source Oral, resp. rate 20, height 5' 6.5" (1.689 m), weight 140 lb 6.4 oz (63.685 kg). Well-developed well-nourished female in no acute distress HEENT exam EOMI PERRLA sclerae anicteric no conjunctival pallor oral mucosa is moist neck is supple lungs clear to auscultation cardiovascular regular rate rhythm abdomen soft nontender nondistended bowel sounds are present no HSM extremities no edema neuro patient's alert oriented otherwise nonfocal Left breast no nipple discharge no inversion or inversion no skin changes thickness is noted from the biopsy site in the left and the right.     STUDIES/RESULTS: Mr Breast Bilateral W Wo Contrast  11/10/2012  *RADIOLOGY REPORT*  Clinical Data: New diagnosis high-grade DCIS, left breast.  BUN and creatinine were obtained on site at [facility] at [address] Results:  BUN 14 mg/dL,  Creatinine 0.5 mg/dL.  BILATERAL BREAST MRI WITH AND WITHOUT CONTRAST  Technique: Multiplanar, multisequence MR images of both breasts were obtained prior to and following the intravenous administration of 13ml of Multihance.  Three dimensional images were evaluated at the independent DynaCad workstation.  Comparison:  Multiple prior mammograms most recently dated 11/08/2012  Findings: Subtle clumped and linear enhancement seen in the retroareolar region of  the left breast, middle third measuring 0.8 (ap) x 1.0 (cc) x 0.6 (trv) cm with a clip at the posterior aspect of the enhancement.  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 No other suspicious mass or enhancement seen in the left breast. Postbiopsy changes are noted in the upper inner quadrant of the right breast.  No suspicious mass or enhancement seen in the right breast.  No axillary or internal mammary adenopathy is present.  IMPRESSION: Known malignancy, left breast as detailed above.  No MRI specific evidence of malignancy, right breast.  RECOMMENDATION: Treatment plan, left breast.  THREE-DIMENSIONAL MR IMAGE RENDERING ON INDEPENDENT WORKSTATION:  Three-dimensional MR images were rendered by post-processing of the original MR data on an independent workstation.  The three- dimensional MR images were interpreted, and findings were reported in the accompanying complete MRI report for this study.  BI-RADS CATEGORY 6:  Known biopsy-proven malignancy - appropriate action should be taken.   Original Report Authenticated By: Vincenza Hews, M.D.    Mm Radiologist Eval And Mgmt  11/07/2012  *RADIOLOGY REPORT*  ESTABLISHED PATIENT OFFICE VISIT - LEVEL II [phone])  Chief Complaint:  The patient returns for results following left breast stereotactic core biopsy.  History:  The patient underwent left breast stereotactic core biopsy for linear calcifications within the left breast.  Also, there are slightly suspicious calcifications located within the upper inner quadrant of the right breast.  There is no family history of breast cancer.  Exam:  The biopsy site within the left breast is clean and dry without hematoma formation or signs of infection.  Pathology: The pathology associated with the left breast stereotactic core biopsy demonstrates high-grade DCIS.  This is concordant with the imaging findings.  I have discussed the findings with the patient and her husband and answered their questions.  Assessment and  Plan:  Recommended right breast stereotactic core biopsy of calcifications within the upper inner quadrant of the right breast.  This has been scheduled for 11/09/2012.  Recommend bilateral breast MRI which has been scheduled for 11/10/2012.  The patient has been scheduled for the breast cancer multidisciplinary clinic on 11/15/2012.  Educational material was shared with the patient and her husband.  Post biopsy wound care instructions were reviewed with the patient.  I have encouraged the patient to call the Breast Center for additional questions or concerns.   Original Report Authenticated By: Rolla Plate, M.D.    Mm Lt Breast Bx W Loc Dev 1st Lesion Image Bx Spec Stereo Guide  11/06/2012  *RADIOLOGY REPORT*  Clinical Data:  Suspicious left breast calcifications  STEREOTACTIC-GUIDED VACUUM ASSISTED BIOPSY OF THE LEFT BREAST AND SPECIMEN RADIOGRAPH  Comparison: Previous exams.  I met with the patient and we discussed the procedure of stereotactic-guided biopsy, including benefits and alternatives. We discussed the high likelihood of a successful procedure. We discussed the risks of the procedure, including infection, bleeding, tissue injury, clip migration, and inadequate sampling. Informed, written consent was given.  Using sterile technique, 2% lidocaine, stereotactic guidance, and a 9 gauge vacuum assisted device, biopsy was performed of calcifications in the lateral aspect of the left breast using  an inferior approach.  Specimen radiograph was performed, showing calcifications in the tissue samples.  Specimens with calcifications are identified for pathology.  At the conclusion of the procedure, a T-shaped tissue marker clip was deployed into the biopsy cavity.  Follow-up 2-view mammogram confirmed clip placement.  IMPRESSION: Stereotactic-guided biopsy of the left breast.  No apparent complications.   Original Report Authenticated By: Baird Lyons, M.D.    Mm Lt Plc Breast Loc Dev   1st Lesion  Inc  Mammo Guide  11/23/2012  *RADIOLOGY REPORT*  Clinical Data:  Biopsy-proven left breast three o'clock location DCIS, preoperative needle localization  NEEDLE LOCALIZATION WITH MAMMOGRAPHIC GUIDANCE AND SPECIMEN RADIOGRAPH  Comparison:  Previous exams.  Patient presents for needle localization prior to needle localization.  I met with the patient and we discussed the procedure of needle localization including benefits and alternatives. We discussed the high likelihood of a successful procedure. We discussed the risks of the procedure, including infection, bleeding, tissue injury, and further surgery. Informed, written consent was given.  Using mammographic guidance, sterile technique, 2% lidocaine and a 7 cm modified Kopans needle, T shaped clip was localized using a lateral to medial approach.  The films are marked for Dr. Dwain Sarna.  Specimen radiograph was performed at day Surgery, and confirms the intact clip and hookwire are present in the tissue sample.  The specimen is marked for pathology.  IMPRESSION: Needle localization left breast.  No apparent complications.   Original Report Authenticated By: Vinnie Langton  Chilton Si, M.D.    Mm Rt Breast Bx W Loc Dev 1st Lesion Image Bx Spec Stereo Guide  11/09/2012  **ADDENDUM** CREATED: 11/09/2012 13:20:05  The patient was called by myself Dr. Micheline Maze.  The patient states she is doing well since her right breast biopsy without problems at the biopsy site.  The patient was given the results of her right breast biopsy which was compatible with a fibroadenoma with microcalcifications.  This is felt to be concordant with the imaging findings.  Patient's immediate questions and concerns were addressed.  The patient has a recent history of a positive left breast stereotactic needle biopsy compatible with high-grade DCIS. The patient will undergo MRI 11/10/2012 and will be seen at the [facility] 11/15/2012.  **END ADDENDUM** SIGNED BY: Melton Alar. Micheline Maze, M.D.   11/08/2012  *RADIOLOGY REPORT*   Clinical Data:  Patient presents for stereotactic core needle biopsy of a group of indeterminate microcalcifications over the inner upper right breast.  The patient has a recent history of a positive left breast stereotactic biopsy demonstrating high-grade DCIS  11/06/2012.  STEREOTACTIC-GUIDED VACUUM ASSISTED BIOPSY OF THE RIGHT BREAST AND SPECIMEN RADIOGRAPH  Comparison: Previous exams.  I met with the patient and we discussed the procedure of stereotactic-guided biopsy, including benefits and alternatives. We discussed the high likelihood of a successful procedure. We discussed the risks of the procedure, including infection, bleeding, tissue injury, clip migration, and inadequate sampling. Informed, written consent was given.  Using sterile technique, 2% lidocaine, stereotactic guidance, and a 9 gauge vacuum assisted device, biopsy was performed of the targeted right breast microcalcifications using a medial to lateral approach.  Specimen radiograph was performed, showing multiple of the targeted microcalcifications.  Specimens with calcifications are identified for pathology.  At the conclusion of the procedure, a  cylinder shaped tissue marker clip was deployed into the biopsy cavity.  Follow-up 2-view mammogram confirmed clip placement accurately at the biopsy site.  IMPRESSION: Stereotactic-guided biopsy of indeterminate right breast microcalcifications.  No apparent complications.   Original Report Authenticated By: Elberta Fortis, M.D.      LABS:    Chemistry      Component Value Date/Time   NA 139 11/15/2012 1224   NA 137 11/27/2010 1255   K 3.9 11/15/2012 1224   K 3.5 11/27/2010 1255   CL 102 11/15/2012 1224   CL 100 11/27/2010 1255   CO2 25 11/15/2012 1224   CO2 27 11/27/2010 1255   BUN 18.6 11/15/2012 1224   BUN 12 11/27/2010 1255   CREATININE 0.8 11/15/2012 1224   CREATININE 0.56 11/27/2010 1255      Component Value Date/Time   CALCIUM 9.8 11/15/2012 1224   CALCIUM 9.7 11/27/2010 1255   ALKPHOS 129  11/15/2012 1224   AST 45* 11/15/2012 1224   ALT 42 11/15/2012 1224   BILITOT 0.37 11/15/2012 1224      Lab Results  Component Value Date   WBC 4.7 11/15/2012   HGB 14.1 11/23/2012   HCT 39.4 11/15/2012   MCV 89.1 11/15/2012   PLT 232 11/15/2012       PATHOLOGY: ADDITIONAL INFORMATION: PROGNOSTIC INDICATORS - ACIS Results: IMMUNOHISTOCHEMICAL AND MORPHOMETRIC ANALYSIS BY THE AUTOMATED CELLULAR IMAGING SYSTEM (ACIS) Estrogen Receptor: 100%, POSITIVE, STRONG STAINING INTENSITY Progesterone Receptor: 0%, NEGATIVE COMMENT: The negative hormone receptor study in this case has an internal positive control. REFERENCE RANGE ESTROGEN RECEPTOR NEGATIVE <1% POSITIVE =>1% PROGESTERONE RECEPTOR NEGATIVE <1% POSITIVE =>1% All controls stained appropriately Jimmy Picket MD Pathologist, Electronic Signature ( Signed 11/10/2012)  FINAL DIAGNOSIS Diagnosis Breast, left, needle core biopsy, lateral - DUCTAL CARCINOMA IN SITU WITH CALCIFICATIONS. - VASCULAR CALCIFICATIONS. - SEE COMMENT. 1 of 2 Duplicate copy FINAL for DELICIA, BERENS [phone]) Microscopic Comment The carcinoma is high grade. Estrogen receptor and progesterone receptor studies will be performed and the results reported separately. The results were called to [facility] on 11/07/2012. 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 Patient and I discussed her radiology and her needle core biopsy results. We also discussed treatment options. Rationale for all therapies. Patient understands that she is a good but breast conservation candidate and she is very much interested in doing this.  #2 patient would be a candidate for a lumpectomy followed by radiation. She was also be candidate  for hormonal therapy adjuvantly with tamoxifen. We discussed this in detail and rationale for adjuvant tamoxifen. We also discussed clinical study NSABP B. 43.  Clinical Trial Eligibility: yes NSABP-B43 Multidisciplinary conference discussion yes     PLAN:    #1 proceed with lumpectomy.  #2 she will be seen back after her surgery for discussion of adjuvant treatment. Certainly she will also be referred to radiation oncology.        Discussion: Patient is being treated per NCCN breast cancer care guidelines appropriate for stage.0   Thank you so much for allowing me to participate in the care of Gentry P Gruver. I will continue to follow up the patient with you and assist in her care.  All questions were answered. The patient knows to call the clinic with any problems, questions or concerns. We can certainly see the patient much sooner if necessary.  I spent 55 minutes counseling the patient face to face. The total time spent in the appointment was 60 minutes.  Drue Second, MD Medical/Oncology [facility] [phone] (beeper) [phone] (Office)  12/03/2012, 5:39 PM

## 2012-12-04 ENCOUNTER — Ambulatory Visit (INDEPENDENT_AMBULATORY_CARE_PROVIDER_SITE_OTHER): Payer: BC Managed Care – PPO | Admitting: General Surgery

## 2012-12-04 ENCOUNTER — Encounter (INDEPENDENT_AMBULATORY_CARE_PROVIDER_SITE_OTHER): Payer: Self-pay | Admitting: General Surgery

## 2012-12-04 VITALS — BP 130/74 | HR 64 | Temp 98.0°F | Resp 18 | Ht 67.0 in | Wt 140.0 lb

## 2012-12-04 DIAGNOSIS — Z09 Encounter for follow-up examination after completed treatment for conditions other than malignant neoplasm: Secondary | ICD-10-CM

## 2012-12-04 NOTE — Progress Notes (Signed)
Subjective:     Patient ID: Krista Butler, female   DOB: Mar 03, 1951, 62 y.o.   MRN: [id number]  HPI 18 yof seen in [facility] with diagnosis of left breast dcis.  I did a left breast wire guided lumpectomy last week with path showing dcis with two margins that are 1 mm, no invasive disease, er pos 100%.  She has done well postop and returns today without complaints.    Review of Systems     Objective:   Physical Exam Healing left breast periareolar incision without infection or hematoma    Assessment:     Left breast dcis     Plan:     I discussed with Dr Mitzi Hansen margins and these are adequate although close. I discussed with her today that excision of more may compromise result and not better her outcome.  I am comfortable proceeding with xrt and antiestrogen therapy. She will see Dr. Welton Flakes and Dr. Mitzi Hansen later this week. I released her to full activity and will see her back in 6 months

## 2012-12-05 ENCOUNTER — Encounter: Payer: Self-pay | Admitting: Oncology

## 2012-12-05 NOTE — Progress Notes (Signed)
Location of Breast Cancer: DCIS left breast  Histology per Pathology Report: ductal carcinoma in situ  Receptor Status: ER positive PR negative   Did patient present with symptoms (if so, please note symptoms) or was this found on screening mammography?: She has thickening in her right breast and was sent for further screening.  The left breast was positive for DCIS on biopsy.  Past/Anticipated interventions by surgeon, if any: breast lumpectomy 11/23/2012  Past/Anticipated interventions by medical oncology, if any: hormonal therapy adjuvantly with tamoxifen  Lymphedema issues, if any:  no  Pain issues, if any:  no  SAFETY ISSUES:  Prior radiation? no  Pacemaker/ICD? no  Possible current pregnancy? No  Is the patient on methotrexate? no  Current Complaints / other details:  Krista Butler here for consult for left breast cancer.  She does have some soreness in the breast from her lumpectomy but otherwise does not have any pain.

## 2012-12-06 ENCOUNTER — Encounter: Payer: Self-pay | Admitting: Radiation Oncology

## 2012-12-06 ENCOUNTER — Ambulatory Visit
Admission: RE | Admit: 2012-12-06 | Discharge: 2012-12-06 | Disposition: A | Discharge status: Home / Self Care | Payer: BC Managed Care – PPO | Source: Ambulatory Visit | Attending: Radiation Oncology | Admitting: Radiation Oncology

## 2012-12-06 VITALS — BP 94/49 | HR 84 | Temp 97.3°F | Ht 67.0 in | Wt 140.0 lb

## 2012-12-06 DIAGNOSIS — C50212 Malignant neoplasm of upper-inner quadrant of left female breast: Secondary | ICD-10-CM

## 2012-12-06 DIAGNOSIS — Z79899 Other long term (current) drug therapy: Secondary | ICD-10-CM | POA: Insufficient documentation

## 2012-12-06 DIAGNOSIS — D0590 Unspecified type of carcinoma in situ of unspecified breast: Secondary | ICD-10-CM | POA: Insufficient documentation

## 2012-12-06 NOTE — Progress Notes (Signed)
Please see the Nurse Progress Note in the MD Initial Consult Encounter for this patient. 

## 2012-12-06 NOTE — Progress Notes (Signed)
Radiation Oncology         [phone] ________________________________  Name: Krista Butler MRN: [id number]  Date: 12/06/2012  DOB: 11-Dec-1950  Follow-Up Visit Note  CC: [name], MD  [name], MD  Diagnosis:   Ductal carcinoma in situ of the left breast  Interval Since Last Radiation:  Not applicable   Narrative:  The patient returns today for routine follow-up.  The patient previously was seen in breast clinic. She proceeded with breast conservation treatment and she is status post a left-sided lumpectomy completed on 11/23/2012. Final pathology revealed DCIS, grade 3. The margins were negative with the closest margin being 1 mm from the inferior and posterior margins. Receptor studies have indicated that the tumor is ER positive and PR negative.                              ALLERGIES:  is allergic to sulfa antibiotics.  Meds: Current Outpatient Prescriptions  Medication Sig Dispense Refill  . fish oil-omega-3 fatty acids 1000 MG capsule Take 2 g by mouth daily.      Marland Kitchen levothyroxine (SYNTHROID, LEVOTHROID) 175 MCG tablet Take 175 mcg by mouth daily before breakfast.      . omeprazole (PRILOSEC) 20 MG capsule TAKE ONE CAPSULE BY MOUTH EVERY DAY 30 MINUTES BEFORE MEAL  30 capsule  0  . OVER THE COUNTER MEDICATION Take 50 mcg by mouth 3 (three) times daily with meals. Calcium with K2, taking 2 with each meal.      . vitamin C (ASCORBIC ACID) 500 MG tablet Take 500 mg by mouth daily. Taking 2 tab daily      . zinc gluconate 50 MG tablet Take 50 mg by mouth daily. 30 mg      . oxyCODONE-acetaminophen (ROXICET) 5-325 MG per tablet Take 1 tablet by mouth every 4 (four) hours as needed for pain.  20 tablet  0  . VAGIFEM 10 MCG TABS        No current facility-administered medications for this encounter.    Physical Findings: The patient is in no acute distress. Patient is alert and oriented.  height is 5\' 7"  (1.702 m) and weight is 140 lb (63.504 kg). Her  temperature is 97.3 F (36.3 C). Her blood pressure is 94/49 and her pulse is 84. .   The patient has a well healing surgical incision present within the upper outer aspect of the left breast. Steri-Strips are present. No sign of infection.  Lab Findings: Lab Results  Component Value Date   WBC 4.7 11/15/2012   HGB 14.1 11/23/2012   HCT 39.4 11/15/2012   MCV 89.1 11/15/2012   PLT 232 11/15/2012     Radiographic Findings: Mr Breast Bilateral W Wo Contrast  11/10/2012   *RADIOLOGY REPORT*  Clinical Data: New diagnosis high-grade DCIS, left breast.  BUN and creatinine were obtained on site at [facility] at [address] Results:  BUN 14 mg/dL,  Creatinine 0.5 mg/dL.  BILATERAL BREAST MRI WITH AND WITHOUT CONTRAST  Technique: Multiplanar, multisequence MR images of both breasts were obtained prior to and following the intravenous administration of 13ml of Multihance.  Three dimensional images were evaluated at the independent DynaCad workstation.  Comparison:  Multiple prior mammograms most recently dated 11/08/2012  Findings: Subtle clumped and linear enhancement seen in the retroareolar region of the left breast, middle third measuring 0.8 (ap) x 1.0 (cc) x 0.6 (trv) cm with  a clip at the posterior aspect of the enhancement.  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 No other suspicious mass or enhancement seen in the left breast. Postbiopsy changes are noted in the upper inner quadrant of the right breast.  No suspicious mass or enhancement seen in the right breast.  No axillary or internal mammary adenopathy is present.  IMPRESSION: Known malignancy, left breast as detailed above.  No MRI specific evidence of malignancy, right breast.  RECOMMENDATION: Treatment plan, left breast.  THREE-DIMENSIONAL MR IMAGE RENDERING ON INDEPENDENT WORKSTATION:  Three-dimensional MR images were rendered by post-processing of the original MR data on an independent workstation.  The three- dimensional MR  images were interpreted, and findings were reported in the accompanying complete MRI report for this study.  BI-RADS CATEGORY 6:  Known biopsy-proven malignancy - appropriate action should be taken.   Original Report Authenticated By: Vincenza Hews, M.D.   Mm Radiologist Eval And Mgmt  11/07/2012   *RADIOLOGY REPORT*  ESTABLISHED PATIENT OFFICE VISIT - LEVEL II [phone])  Chief Complaint:  The patient returns for results following left breast stereotactic core biopsy.  History:  The patient underwent left breast stereotactic core biopsy for linear calcifications within the left breast.  Also, there are slightly suspicious calcifications located within the upper inner quadrant of the right breast.  There is no family history of breast cancer.  Exam:  The biopsy site within the left breast is clean and dry without hematoma formation or signs of infection.  Pathology: The pathology associated with the left breast stereotactic core biopsy demonstrates high-grade DCIS.  This is concordant with the imaging findings.  I have discussed the findings with the patient and her husband and answered their questions.  Assessment and Plan:  Recommended right breast stereotactic core biopsy of calcifications within the upper inner quadrant of the right breast.  This has been scheduled for 11/09/2012.  Recommend bilateral breast MRI which has been scheduled for 11/10/2012.  The patient has been scheduled for the breast cancer multidisciplinary clinic on 11/15/2012.  Educational material was shared with the patient and her husband.  Post biopsy wound care instructions were reviewed with the patient.  I have encouraged the patient to call the Breast Center for additional questions or concerns.   Original Report Authenticated By: Rolla Plate, M.D.   Mm Lt Plc Breast Loc Dev   1st Lesion  Inc Mammo Guide  11/23/2012   *RADIOLOGY REPORT*  Clinical Data:  Biopsy-proven left breast three o'clock location DCIS, preoperative needle  localization  NEEDLE LOCALIZATION WITH MAMMOGRAPHIC GUIDANCE AND SPECIMEN RADIOGRAPH  Comparison:  Previous exams.  Patient presents for needle localization prior to needle localization.  I met with the patient and we discussed the procedure of needle localization including benefits and alternatives. We discussed the high likelihood of a successful procedure. We discussed the risks of the procedure, including infection, bleeding, tissue injury, and further surgery. Informed, written consent was given.  Using mammographic guidance, sterile technique, 2% lidocaine and a 7 cm modified Kopans needle, T shaped clip was localized using a lateral to medial approach.  The films are marked for Dr. Dwain Sarna.  Specimen radiograph was performed at day Surgery, and confirms the intact clip and hookwire are present in the tissue sample.  The specimen is marked for pathology.  IMPRESSION: Needle localization left breast.  No apparent complications.   Original Report Authenticated By: Christiana Pellant, M.D.   Mm Rt Breast Bx W Loc Dev 1st Lesion Image Bx  Spec Stereo Guide  11/09/2012   **ADDENDUM** CREATED: 11/09/2012 13:20:05  The patient was called by myself Dr. Micheline Maze.  The patient states she is doing well since her right breast biopsy without problems at the biopsy site.  The patient was given the results of her right breast biopsy which was compatible with a fibroadenoma with microcalcifications.  This is felt to be concordant with the imaging findings.  Patient's immediate questions and concerns were addressed.  The patient has a recent history of a positive left breast stereotactic needle biopsy compatible with high-grade DCIS. The patient will undergo MRI 11/10/2012 and will be seen at the [facility] 11/15/2012.  **END ADDENDUM** SIGNED BY: Melton Alar. Micheline Maze, M.D.  11/08/2012   *RADIOLOGY REPORT*  Clinical Data:  Patient presents for stereotactic core needle biopsy of a group of indeterminate microcalcifications over the inner  upper right breast.  The patient has a recent history of a positive left breast stereotactic biopsy demonstrating high-grade DCIS  11/06/2012.  STEREOTACTIC-GUIDED VACUUM ASSISTED BIOPSY OF THE RIGHT BREAST AND SPECIMEN RADIOGRAPH  Comparison: Previous exams.  I met with the patient and we discussed the procedure of stereotactic-guided biopsy, including benefits and alternatives. We discussed the high likelihood of a successful procedure. We discussed the risks of the procedure, including infection, bleeding, tissue injury, clip migration, and inadequate sampling. Informed, written consent was given.  Using sterile technique, 2% lidocaine, stereotactic guidance, and a 9 gauge vacuum assisted device, biopsy was performed of the targeted right breast microcalcifications using a medial to lateral approach.  Specimen radiograph was performed, showing multiple of the targeted microcalcifications.  Specimens with calcifications are identified for pathology.  At the conclusion of the procedure, a  cylinder shaped tissue marker clip was deployed into the biopsy cavity.  Follow-up 2-view mammogram confirmed clip placement accurately at the biopsy site.  IMPRESSION: Stereotactic-guided biopsy of indeterminate right breast microcalcifications.  No apparent complications.   Original Report Authenticated By: Elberta Fortis, M.D.    Impression:    The patient is status post lumpectomy for DCIS of the left breast. She had negative although somewhat close margins at 1 mm. I have discussed this with Dr. Dwain Sarna and we both are comfortable proceeding with adjuvant radiotherapy. The patient is scheduled to see Dr. Park Breed in medical oncology in 2 days to discuss hormonal treatment.   The patient I believe is appropriate to proceed with adjuvant radiotherapy. We discussed once again the rationale of this treatment. The patient had a number of questions which were answered. She understands the side effects and risks of treatment as  well.  Plan:  Simulation in approximately one to one and a half weeks to allow treatment planning for adjuvant radiotherapy. I anticipate approximately 6-1/2 weeks of radiotherapy.   I spent 15 minutes with the patient today, the majority of which was spent counseling the patient on the diagnosis of cancer and coordinating care.   [name], M.D., Ph.D.

## 2012-12-06 NOTE — Addendum Note (Signed)
Encounter addended by: Eduardo Osier, RN on: 12/06/2012  3:26 PM<BR>     Documentation filed: Charges VN

## 2012-12-08 ENCOUNTER — Encounter: Payer: Self-pay | Admitting: *Deleted

## 2012-12-08 ENCOUNTER — Ambulatory Visit (HOSPITAL_BASED_OUTPATIENT_CLINIC_OR_DEPARTMENT_OTHER): Payer: BC Managed Care – PPO | Admitting: Oncology

## 2012-12-08 ENCOUNTER — Telehealth: Payer: Self-pay | Admitting: *Deleted

## 2012-12-08 ENCOUNTER — Encounter: Payer: Self-pay | Admitting: Oncology

## 2012-12-08 VITALS — BP 97/62 | HR 86 | Temp 97.6°F | Resp 20 | Ht 67.0 in | Wt 139.9 lb

## 2012-12-08 DIAGNOSIS — D0590 Unspecified type of carcinoma in situ of unspecified breast: Secondary | ICD-10-CM

## 2012-12-08 DIAGNOSIS — Z170 Estrogen receptor positive status [ER+]: Secondary | ICD-10-CM

## 2012-12-08 DIAGNOSIS — C50212 Malignant neoplasm of upper-inner quadrant of left female breast: Secondary | ICD-10-CM

## 2012-12-08 NOTE — Telephone Encounter (Signed)
Lm informing the pt that KK would like to see her back in 2 months. gv appt d/t for 02/15/13 @ 2:45pm. i also made the pt aware that i will mail a letter/cal...td

## 2012-12-08 NOTE — Patient Instructions (Addendum)
#  1 we discussed your pathology today you have only DCIS. There is no invasive cancer. Her DCIS was ER positive.  #2 we discussed clinical study NSABP B-43 testing your tumor for the Her2Neu receptor  #3 we discussed use of tamoxifen for prevention of future breast cancers.  #4 you have an appointment set up to see Dr. Mitzi Hansen next week  #5 I will see you back in about 2 months time for followup.

## 2012-12-11 NOTE — Progress Notes (Signed)
OFFICE PROGRESS NOTE  CC  Krista Hy, MD [address] [place] [address] Dr. Dorothy Butler  Dr. Emelia Butler   DIAGNOSIS: 62 year old female with new diagnosis of DCIS that is high-grade. Patient is seen in the multidisciplinary breast clinic for discussion of treatment options.   STAGE:  Cancer of upper-inner quadrant of female breast  Primary site: Breast (Left)  Staging method: AJCC 7th Edition  Clinical: Stage 0 (Tis (DCIS), N0, cM0)  Summary: Stage 0 (Tis (DCIS), N0, cM0)   PRIOR THERAPY: #1Patient noted some thickening of the right breast. She did not have any symptoms in the left breast. The thickening did not show any discrete mass or distortion on the right. However the patient was found to have numerous bilateral calcifications. Within the left breast there was a new linear/branching calcifications measuring 12 mm. This was suspicious for malignancy. There are also 1.1 cm suspicious calcifications in a heterogeneous group within the upper inner quadrant of the right breast. She underwent stereotactic biopsy of both of these areas  #2The biopsy on the right was negative and only noted to be a fibroadenoma with associated coarse calcifications. 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  #3 patient had a MRI performed that showed a clot area of enhancement in the retroareolar region of the left breast measuring 1 cm in maximum does mention corresponding to the known DCIS.  #4 patient is now status post left-sided lumpectomy on 11/23/2012. Her pathology revealed DCIS grade 3. Margins were negative with the closest margin being 1 mm from the inferior posterior margins. Tumor was ER ER positive PR positive.  #5 patient has been seen by Dr. Dorothy Butler and she is known to be simulated for radiation therapy.  #6 patient will proceed with after her radiation with chemoprevention with  tamoxifen 20 mg daily. But this will begin after she completes radiation therapy.  #7 NSABP B. 43 clinical study enrollment. He did meet with the nurse and her tissue will be sent for HER-2 testing.   CURRENT THERAPY:  INTERVAL HISTORY: Krista Butler 62 y.o. female returns for followup after her surgery. Her final pathology does reveal a grade 3 ductal carcinoma in situ of the left breast. Tumor was ER positive PR negative. There was no evidence of invasive disease. She today feels well. We discussed her pathology in detail. She has no other complaints.  MEDICAL HISTORY: Past Medical History  Diagnosis Date  . Breast cancer     left  . Thyroid disease   . Osteoporosis   . Wears glasses     ALLERGIES:  is allergic to sulfa antibiotics and percocet.  MEDICATIONS:  Current Outpatient Prescriptions  Medication Sig Dispense Refill  . fish oil-omega-3 fatty acids 1000 MG capsule Take 2 g by mouth daily.      Marland Kitchen levothyroxine (SYNTHROID, LEVOTHROID) 175 MCG tablet Take 175 mcg by mouth daily before breakfast.      . omeprazole (PRILOSEC) 20 MG capsule TAKE ONE CAPSULE BY MOUTH EVERY DAY 30 MINUTES BEFORE MEAL  30 capsule  0  . OVER THE COUNTER MEDICATION Take 50 mcg by mouth 3 (three) times daily with meals. Calcium with K2, taking 2 with each meal.      . vitamin C (ASCORBIC ACID) 500 MG tablet Take 500 mg by mouth daily. Taking 2 tab daily      . zinc gluconate 50 MG tablet Take 50 mg  by mouth daily. 30 mg       No current facility-administered medications for this visit.    SURGICAL HISTORY:  Past Surgical History  Procedure Laterality Date  . Tonsillectomy    . Upper gi endoscopy  2012  . Colonoscopy    . Fracture surgery      lt arm as child  . Breast biopsy  1997    lt breast cyst  . Breast lumpectomy with needle localization Left 11/23/2012    Procedure: BREAST LUMPECTOMY WITH NEEDLE LOCALIZATION;  Surgeon: Krista Loron, MD;  Location: [facility];   Service: General;  Laterality: Left;    REVIEW OF SYSTEMS:  A comprehensive review of systems was negative.   HEALTH MAINTENANCE:   PHYSICAL EXAMINATION: Blood pressure 97/62, pulse 86, temperature 97.6 F (36.4 C), temperature source Oral, resp. rate 20, height 5\' 7"  (1.702 m), weight 139 lb 14.4 oz (63.458 kg). Body mass index is 21.91 kg/(m^2). ECOG PERFORMANCE STATUS: 0 - Asymptomatic   well-developed well-nourished female in no acute distress HEENT exam EOMI PERRLA sclerae anicteric no conjunctival pallor oral mucosa is moist neck is supple lungs are clear cardiovascular is regular rate rhythm abdomen is soft nontender nondistended bowel sounds are present no HSM extremities no edema neuro patient's alert oriented otherwise nonfocal left breast reveals healing surgical scar no nodularity no tenderness no seroma. The right breast no masses or nipple discharge.   LABORATORY DATA: Lab Results  Component Value Date   WBC 4.7 11/15/2012   HGB 14.1 11/23/2012   HCT 39.4 11/15/2012   MCV 89.1 11/15/2012   PLT 232 11/15/2012      Chemistry      Component Value Date/Time   NA 139 11/15/2012 1224   NA 137 11/27/2010 1255   K 3.9 11/15/2012 1224   K 3.5 11/27/2010 1255   CL 102 11/15/2012 1224   CL 100 11/27/2010 1255   CO2 25 11/15/2012 1224   CO2 27 11/27/2010 1255   BUN 18.6 11/15/2012 1224   BUN 12 11/27/2010 1255   CREATININE 0.8 11/15/2012 1224   CREATININE 0.56 11/27/2010 1255      Component Value Date/Time   CALCIUM 9.8 11/15/2012 1224   CALCIUM 9.7 11/27/2010 1255   ALKPHOS 129 11/15/2012 1224   AST 45* 11/15/2012 1224   ALT 42 11/15/2012 1224   BILITOT 0.37 11/15/2012 1224       RADIOGRAPHIC STUDIES:  Mm Lt Plc Breast Loc Dev   1st Lesion  Inc Mammo Guide  11/23/2012   *RADIOLOGY REPORT*  Clinical Data:  Biopsy-proven left breast three o'clock location DCIS, preoperative needle localization  NEEDLE LOCALIZATION WITH MAMMOGRAPHIC GUIDANCE AND SPECIMEN RADIOGRAPH  Comparison:  Previous  exams.  Patient presents for needle localization prior to needle localization.  I met with the patient and we discussed the procedure of needle localization including benefits and alternatives. We discussed the high likelihood of a successful procedure. We discussed the risks of the procedure, including infection, bleeding, tissue injury, and further surgery. Informed, written consent was given.  Using mammographic guidance, sterile technique, 2% lidocaine and a 7 cm modified Kopans needle, T shaped clip was localized using a lateral to medial approach.  The films are marked for Dr. Dwain Sarna.  Specimen radiograph was performed at day Surgery, and confirms the intact clip and hookwire are present in the tissue sample.  The specimen is marked for pathology.  IMPRESSION: Needle localization left breast.  No apparent complications.   Original Report  Authenticated By: Christiana Pellant, M.D.    ASSESSMENT: 62 year old female with  #1 left-sided DCIS diagnosed in April 2014 she is now status post left lumpectomy on 11/23/2012. Final pathology only revealed a DCIS that was ER positive PR negative. Postoperatively she is doing well.  #2 we discussed NSABP B. 43 clinical study days she is interested in it. She did meet with the research nurse.  #3 patient will need adjuvant radiation therapy radiation therapy.  #3 once she completes radiation therapy we will plan on doing chemoprevention with tamoxifen 20 mg daily. We discussed the rationale side effects.   PLAN:  #1 NSABP B. 43 clinical trial enrollment.  #2 radiation therapy eventually  #3 I will see her back in about 2-3 months time   All questions were answered. The patient knows to call the clinic with any problems, questions or concerns. We can certainly see the patient much sooner if necessary.  I spent 25 minutes counseling the patient face to face. The total time spent in the appointment was 30 minutes.    Drue Second,  MD Medical/Oncology [facility] [phone] (beeper) [phone] (Office)

## 2012-12-15 ENCOUNTER — Ambulatory Visit
Admission: RE | Admit: 2012-12-15 | Discharge: 2012-12-15 | Disposition: A | Discharge status: Home / Self Care | Payer: BC Managed Care – PPO | Source: Ambulatory Visit | Attending: Radiation Oncology | Admitting: Radiation Oncology

## 2012-12-15 ENCOUNTER — Telehealth: Payer: Self-pay | Admitting: *Deleted

## 2012-12-15 DIAGNOSIS — C50212 Malignant neoplasm of upper-inner quadrant of left female breast: Secondary | ICD-10-CM

## 2012-12-15 DIAGNOSIS — Z510 Encounter for antineoplastic radiation therapy: Secondary | ICD-10-CM | POA: Insufficient documentation

## 2012-12-15 DIAGNOSIS — C50919 Malignant neoplasm of unspecified site of unspecified female breast: Secondary | ICD-10-CM | POA: Insufficient documentation

## 2012-12-15 NOTE — Telephone Encounter (Signed)
12/15/2012  Notified patient of central lab tissue testing results showing that tumor is Her-2/neu negative. As a result, patient will not qualify for participation in the NSABP B-43 treatment trial. Thanked patient for her willingness to participate. Noted that a copy of the results will be added to her electronic medical record for future reference, and that Dr. Mitzi Hansen will be notified of the results in order to proceed with radiation therapy scheduling as planned.  Cindy S. Clelia Croft BSN, RN, [facility] 12/15/2012 9:13 AM

## 2012-12-17 NOTE — Addendum Note (Signed)
Encounter addended by: Jonna Coup, MD on: 12/17/2012  9:40 PM<BR>     Documentation filed: Notes Section

## 2012-12-17 NOTE — Progress Notes (Addendum)
  Radiation Oncology         [phone] ________________________________  Name: Krista Butler MRN: [id number]  Date: 12/15/2012  DOB: 03-01-1951   SIMULATION AND TREATMENT PLANNING NOTE  The patient presented for simulation prior to beginning her course of radiation treatment for her diagnosis of left-sided breast cancer. The patient was placed in a supine position on a breast board. A customized accuform device was also constructed, and this complex treatment device will be used on a daily basis during her treatment. In this fashion, a CT scan was obtained through the chest area and an isocenter was placed near the chest wall within the left breast. Both a free breathing scan and a breath-hold scan were both obtained to determine which technique was optimal, and to see if the breath-hold technique markedly improved the anticipated dose to the heart. This was the case. Therefore we will use this technique.  The patient will be planned to receive a course of radiation initially to a dose of 50 gray. This will consist of a whole breast radiotherapy technique. To accomplish this, 2 customized blocks have been designed which will correspond to medial and lateral whole breast tangent fields. This treatment will be accomplished at 2 gray per fraction. A complex isodose plan is requested to ensure that the breast target area is adequately covered dosimetrically. A forward planning technique will also be evaluated to determine if this approach improves the plan. It is anticipated that the patient will then receive a 14.4 gray boost to the seroma cavity which has been contoured. This will be accomplished at 1.8 gray per fraction. The final anticipated total dose therefore will correspond to 64.4 gray.  This initial treatment will consist of a 3-D conformal technique. The seroma/lumpectomy cavity has been contoured as the primary target structure. Additionally, dose volume histograms of both this target as well  as the lungs and heart will also be evaluated. Such an approach is necessary to ensure that the target area is adequately covered while the nearby critical normal structures are adequately spared.   _______________________________   [name], MD, PhD

## 2012-12-22 ENCOUNTER — Ambulatory Visit
Admission: RE | Admit: 2012-12-22 | Discharge: 2012-12-22 | Disposition: A | Discharge status: Home / Self Care | Payer: BC Managed Care – PPO | Source: Ambulatory Visit | Attending: Radiation Oncology | Admitting: Radiation Oncology

## 2012-12-25 ENCOUNTER — Ambulatory Visit
Admission: RE | Admit: 2012-12-25 | Discharge: 2012-12-25 | Disposition: A | Discharge status: Home / Self Care | Payer: BC Managed Care – PPO | Source: Ambulatory Visit | Attending: Radiation Oncology | Admitting: Radiation Oncology

## 2012-12-26 ENCOUNTER — Ambulatory Visit
Admission: RE | Admit: 2012-12-26 | Discharge: 2012-12-26 | Disposition: A | Discharge status: Home / Self Care | Payer: BC Managed Care – PPO | Source: Ambulatory Visit | Attending: Radiation Oncology | Admitting: Radiation Oncology

## 2012-12-26 DIAGNOSIS — C50212 Malignant neoplasm of upper-inner quadrant of left female breast: Secondary | ICD-10-CM

## 2012-12-26 MED ORDER — RADIAPLEXRX EX GEL
Freq: Once | CUTANEOUS | Status: AC
  Administered 2012-12-26: 17:00:00 via TOPICAL

## 2012-12-26 MED ORDER — ALRA NON-METALLIC DEODORANT (RAD-ONC)
1.0000 "application " | Freq: Once | TOPICAL | Status: AC
  Administered 2012-12-26: 1 via TOPICAL

## 2012-12-26 NOTE — Progress Notes (Signed)
Ms. Radebaugh here for post sim education.  She was given the Radiation Therapy and You book and educated on the potential side effects of radiation including fatigue, hair loss and skin changes.  She was given alra deoderant and radiaplex gel.  She was instructed to apply the radiaplex twice a day with the first application at least 4 hours before treatment and at bedtime.  She was oriented to the clinic and was educated on how PUT day on Friday works with Dr. Mitzi Hansen.

## 2012-12-27 ENCOUNTER — Ambulatory Visit
Admission: RE | Admit: 2012-12-27 | Discharge: 2012-12-27 | Disposition: A | Discharge status: Home / Self Care | Payer: BC Managed Care – PPO | Source: Ambulatory Visit | Attending: Radiation Oncology | Admitting: Radiation Oncology

## 2012-12-28 ENCOUNTER — Ambulatory Visit
Admission: RE | Admit: 2012-12-28 | Discharge: 2012-12-28 | Disposition: A | Discharge status: Home / Self Care | Payer: BC Managed Care – PPO | Source: Ambulatory Visit | Attending: Radiation Oncology | Admitting: Radiation Oncology

## 2012-12-28 VITALS — BP 106/67 | HR 75 | Temp 97.9°F | Ht 67.0 in | Wt 141.7 lb

## 2012-12-28 DIAGNOSIS — C50212 Malignant neoplasm of upper-inner quadrant of left female breast: Secondary | ICD-10-CM

## 2012-12-28 NOTE — Progress Notes (Signed)
Krista Butler here for weekly under treat visit.  She has had 4/25 fractions to her left breast.  She denies pain although she has a sore throat from sinus drainage.  She is wondering if it is OK to take supplements like fish oil and zinc during therapy.  Her skin is intact and she is using radiaplex gel twice a day.

## 2012-12-28 NOTE — Progress Notes (Signed)
   Department of Radiation Oncology  Phone:  [phone] Fax:        [phone]  Weekly Treatment Note    Name: Krista Butler Date: 12/28/2012 MRN: [id number] DOB: 09/11/1950   Current dose: 8 Gy  Current fraction: 4   MEDICATIONS: Current Outpatient Prescriptions  Medication Sig Dispense Refill  . dextromethorphan-guaiFENesin (MUCINEX DM) 30-600 MG per 12 hr tablet Take 1 tablet by mouth every 12 (twelve) hours.      . hyaluronate sodium (RADIAPLEXRX) GEL Apply 1 application topically 2 (two) times daily.      Marland Kitchen levothyroxine (SYNTHROID, LEVOTHROID) 175 MCG tablet Take 175 mcg by mouth daily before breakfast.      . mometasone (NASONEX) 50 MCG/ACT nasal spray Place 2 sprays into the nose daily.      . non-metallic deodorant Thornton Papas) MISC Apply 1 application topically daily as needed.      Marland Kitchen omeprazole (PRILOSEC) 20 MG capsule TAKE ONE CAPSULE BY MOUTH EVERY DAY 30 MINUTES BEFORE MEAL  30 capsule  0  . fish oil-omega-3 fatty acids 1000 MG capsule Take 2 g by mouth daily.      Marland Kitchen OVER THE COUNTER MEDICATION Take 50 mcg by mouth 3 (three) times daily with meals. Calcium with K2, taking 2 with each meal.      . vitamin C (ASCORBIC ACID) 500 MG tablet Take 500 mg by mouth daily. Taking 2 tab daily      . zinc gluconate 50 MG tablet Take 50 mg by mouth daily. 30 mg       No current facility-administered medications for this encounter.     ALLERGIES: Sulfa antibiotics and Percocet   LABORATORY DATA:  Lab Results  Component Value Date   WBC 4.7 11/15/2012   HGB 14.1 11/23/2012   HCT 39.4 11/15/2012   MCV 89.1 11/15/2012   PLT 232 11/15/2012   Lab Results  Component Value Date   NA 139 11/15/2012   K 3.9 11/15/2012   CL 102 11/15/2012   CO2 25 11/15/2012   Lab Results  Component Value Date   ALT 42 11/15/2012   AST 45* 11/15/2012   ALKPHOS 129 11/15/2012   BILITOT 0.37 11/15/2012     NARRATIVE: Krista Butler was seen today for weekly treatment management. The chart was checked  and the patient's films were reviewed. The patient is doing well in her first week of treatment. No difficulties. She had a number of questions which we reviewed today. She is okay to take some of the supplements during treatment which she asked about such as fish oil.,   PHYSICAL EXAMINATION: height is 5\' 7"  (1.702 m) and weight is 141 lb 11.2 oz (64.275 kg). Her temperature is 97.9 F (36.6 C). Her blood pressure is 106/67 and her pulse is 75.        ASSESSMENT: The patient is doing satisfactorily with treatment.  PLAN: We will continue with the patient's radiation treatment as planned.

## 2012-12-29 ENCOUNTER — Ambulatory Visit
Admission: RE | Admit: 2012-12-29 | Discharge: 2012-12-29 | Disposition: A | Discharge status: Home / Self Care | Payer: BC Managed Care – PPO | Source: Ambulatory Visit | Attending: Radiation Oncology | Admitting: Radiation Oncology

## 2013-01-01 ENCOUNTER — Ambulatory Visit
Admission: RE | Admit: 2013-01-01 | Discharge: 2013-01-01 | Disposition: A | Discharge status: Home / Self Care | Payer: BC Managed Care – PPO | Source: Ambulatory Visit | Attending: Radiation Oncology | Admitting: Radiation Oncology

## 2013-01-02 ENCOUNTER — Ambulatory Visit
Admission: RE | Admit: 2013-01-02 | Discharge: 2013-01-02 | Disposition: A | Discharge status: Home / Self Care | Payer: BC Managed Care – PPO | Source: Ambulatory Visit | Attending: Radiation Oncology | Admitting: Radiation Oncology

## 2013-01-03 ENCOUNTER — Ambulatory Visit
Admission: RE | Admit: 2013-01-03 | Discharge: 2013-01-03 | Disposition: A | Discharge status: Home / Self Care | Payer: BC Managed Care – PPO | Source: Ambulatory Visit | Attending: Radiation Oncology | Admitting: Radiation Oncology

## 2013-01-04 ENCOUNTER — Ambulatory Visit
Admission: RE | Admit: 2013-01-04 | Discharge: 2013-01-04 | Disposition: A | Discharge status: Home / Self Care | Payer: BC Managed Care – PPO | Source: Ambulatory Visit | Attending: Radiation Oncology | Admitting: Radiation Oncology

## 2013-01-05 ENCOUNTER — Ambulatory Visit
Admission: RE | Admit: 2013-01-05 | Discharge: 2013-01-05 | Disposition: A | Discharge status: Home / Self Care | Payer: BC Managed Care – PPO | Source: Ambulatory Visit | Attending: Radiation Oncology | Admitting: Radiation Oncology

## 2013-01-05 ENCOUNTER — Encounter: Payer: Self-pay | Admitting: Radiation Oncology

## 2013-01-05 VITALS — BP 101/66 | HR 76 | Temp 97.7°F | Ht 67.0 in | Wt 143.4 lb

## 2013-01-05 DIAGNOSIS — C50212 Malignant neoplasm of upper-inner quadrant of left female breast: Secondary | ICD-10-CM

## 2013-01-05 NOTE — Progress Notes (Signed)
Krista Butler here for weekly under treat visit.  She has had 10/25 fractions to her left breast.  She denies pain and nausea.  She has occasional fatigue.  Her skin is intact and she is using radiaplex gel twice a day.

## 2013-01-05 NOTE — Progress Notes (Signed)
  Radiation Oncology         [phone] ________________________________  Name: Krista Butler MRN: [id number]  Date: 01/05/2013  DOB: [birth year]  Weekly Radiation Therapy Management  Current Dose: 20 Gy     Planned Dose:  50 Gy  Narrative . . . . . . . . The patient presents for routine under treatment assessment.                                                               The patient is without complaint.                                 Set-up films were reviewed.                                 The chart was checked. Physical Findings. . .  height is 5\' 7"  (1.702 m) and weight is 143 lb 6.4 oz (65.046 kg). Her temperature is 97.7 F (36.5 C). Her blood pressure is 101/66 and her pulse is 76. . Weight essentially stable.  No significant changes. Impression . . . . . . . The patient is  tolerating radiation. Plan . . . . . . . . . . . . Continue treatment as planned.  ________________________________  Artist Pais. Kathrynn Running, M.D.

## 2013-01-08 ENCOUNTER — Ambulatory Visit
Admission: RE | Admit: 2013-01-08 | Discharge: 2013-01-08 | Disposition: A | Discharge status: Home / Self Care | Payer: BC Managed Care – PPO | Source: Ambulatory Visit | Attending: Radiation Oncology | Admitting: Radiation Oncology

## 2013-01-09 ENCOUNTER — Ambulatory Visit
Admission: RE | Admit: 2013-01-09 | Discharge: 2013-01-09 | Disposition: A | Discharge status: Home / Self Care | Payer: BC Managed Care – PPO | Source: Ambulatory Visit | Attending: Radiation Oncology | Admitting: Radiation Oncology

## 2013-01-10 ENCOUNTER — Telehealth: Payer: Self-pay | Admitting: Oncology

## 2013-01-10 ENCOUNTER — Ambulatory Visit
Admission: RE | Admit: 2013-01-10 | Discharge: 2013-01-10 | Disposition: A | Discharge status: Home / Self Care | Payer: BC Managed Care – PPO | Source: Ambulatory Visit | Attending: Radiation Oncology | Admitting: Radiation Oncology

## 2013-01-11 ENCOUNTER — Ambulatory Visit
Admission: RE | Admit: 2013-01-11 | Discharge: 2013-01-11 | Disposition: A | Discharge status: Home / Self Care | Payer: BC Managed Care – PPO | Source: Ambulatory Visit | Attending: Radiation Oncology | Admitting: Radiation Oncology

## 2013-01-12 ENCOUNTER — Encounter: Payer: Self-pay | Admitting: Radiation Oncology

## 2013-01-12 ENCOUNTER — Ambulatory Visit
Admission: RE | Admit: 2013-01-12 | Discharge: 2013-01-12 | Disposition: A | Discharge status: Home / Self Care | Payer: BC Managed Care – PPO | Source: Ambulatory Visit | Attending: Radiation Oncology | Admitting: Radiation Oncology

## 2013-01-12 VITALS — BP 93/65 | HR 77 | Temp 97.6°F | Ht 67.0 in | Wt 141.1 lb

## 2013-01-12 DIAGNOSIS — C50212 Malignant neoplasm of upper-inner quadrant of left female breast: Secondary | ICD-10-CM

## 2013-01-12 NOTE — Progress Notes (Signed)
   Department of Radiation Oncology  Phone:  [phone] Fax:        [phone]  Weekly Treatment Note    Name: Krista Butler Date: 01/12/2013 MRN: [id number] DOB: 01/06/1951   Current dose: 30 Gy  Current fraction: 15   MEDICATIONS: Current Outpatient Prescriptions  Medication Sig Dispense Refill  . dextromethorphan-guaiFENesin (MUCINEX DM) 30-600 MG per 12 hr tablet Take 1 tablet by mouth every 12 (twelve) hours.      . fish oil-omega-3 fatty acids 1000 MG capsule Take 2 g by mouth daily.      . hyaluronate sodium (RADIAPLEXRX) GEL Apply 1 application topically 2 (two) times daily.      Marland Kitchen levothyroxine (SYNTHROID, LEVOTHROID) 175 MCG tablet Take 175 mcg by mouth daily before breakfast.      . mometasone (NASONEX) 50 MCG/ACT nasal spray Place 2 sprays into the nose daily.      . non-metallic deodorant Thornton Papas) MISC Apply 1 application topically daily as needed.      Marland Kitchen omeprazole (PRILOSEC) 20 MG capsule TAKE ONE CAPSULE BY MOUTH EVERY DAY 30 MINUTES BEFORE MEAL  30 capsule  0  . OVER THE COUNTER MEDICATION Take 50 mcg by mouth 3 (three) times daily with meals. Calcium with K2, taking 2 with each meal.      . vitamin C (ASCORBIC ACID) 500 MG tablet Take 500 mg by mouth daily. Taking 2 tab daily      . zinc gluconate 50 MG tablet Take 50 mg by mouth daily. 30 mg       No current facility-administered medications for this encounter.     ALLERGIES: Sulfa antibiotics and Percocet   LABORATORY DATA:  Lab Results  Component Value Date   WBC 4.7 11/15/2012   HGB 14.1 11/23/2012   HCT 39.4 11/15/2012   MCV 89.1 11/15/2012   PLT 232 11/15/2012   Lab Results  Component Value Date   NA 139 11/15/2012   K 3.9 11/15/2012   CL 102 11/15/2012   CO2 25 11/15/2012   Lab Results  Component Value Date   ALT 42 11/15/2012   AST 45* 11/15/2012   ALKPHOS 129 11/15/2012   BILITOT 0.37 11/15/2012     NARRATIVE: Krista Butler was seen today for weekly treatment management. The chart was  checked and the patient's films were reviewed. The patient is doing very well. Minimal irritation. No real complaints.  PHYSICAL EXAMINATION: height is 5\' 7"  (1.702 m) and weight is 141 lb 1.6 oz (64.003 kg). Her temperature is 97.6 F (36.4 C). Her blood pressure is 93/65 and her pulse is 77.      moderate hyperpigmentation present diffusely. No desquamation.  ASSESSMENT: The patient is doing satisfactorily with treatment.  PLAN: We will continue with the patient's radiation treatment as planned. The patient will continue her current skin care.

## 2013-01-12 NOTE — Progress Notes (Signed)
Krista Butler here for weekly under treat visit.  She has had 15 fractions to her left breast.  She denies pain except she does have some discomfort along the incision line on her left breast.  The skin on the incision has hyperpigmentation.  She is using radiaplex gel.  She denies fatigue.

## 2013-01-15 ENCOUNTER — Ambulatory Visit
Admission: RE | Admit: 2013-01-15 | Discharge: 2013-01-15 | Disposition: A | Discharge status: Home / Self Care | Payer: BC Managed Care – PPO | Source: Ambulatory Visit | Attending: Radiation Oncology | Admitting: Radiation Oncology

## 2013-01-16 ENCOUNTER — Ambulatory Visit
Admission: RE | Admit: 2013-01-16 | Discharge: 2013-01-16 | Disposition: A | Discharge status: Home / Self Care | Payer: BC Managed Care – PPO | Source: Ambulatory Visit | Attending: Radiation Oncology | Admitting: Radiation Oncology

## 2013-01-17 ENCOUNTER — Ambulatory Visit
Admission: RE | Admit: 2013-01-17 | Discharge: 2013-01-17 | Disposition: A | Discharge status: Home / Self Care | Payer: BC Managed Care – PPO | Source: Ambulatory Visit | Attending: Radiation Oncology | Admitting: Radiation Oncology

## 2013-01-18 ENCOUNTER — Ambulatory Visit
Admission: RE | Admit: 2013-01-18 | Discharge: 2013-01-18 | Disposition: A | Discharge status: Home / Self Care | Payer: BC Managed Care – PPO | Source: Ambulatory Visit | Attending: Radiation Oncology | Admitting: Radiation Oncology

## 2013-01-18 VITALS — BP 98/53 | HR 74 | Temp 98.1°F | Ht 67.0 in | Wt 140.4 lb

## 2013-01-18 DIAGNOSIS — C50212 Malignant neoplasm of upper-inner quadrant of left female breast: Secondary | ICD-10-CM

## 2013-01-18 MED ORDER — RADIAPLEXRX EX GEL
Freq: Once | CUTANEOUS | Status: AC
  Administered 2013-01-18: 16:00:00 via TOPICAL

## 2013-01-18 NOTE — Progress Notes (Signed)
   Department of Radiation Oncology  Phone:  [phone] Fax:        [phone]  Weekly Treatment Note    Name: Krista Butler Date: 01/18/2013 MRN: [id number] DOB: Sep 25, 1950   Current dose: 38 Gy  Current fraction: 19   MEDICATIONS: Current Outpatient Prescriptions  Medication Sig Dispense Refill  . dextromethorphan-guaiFENesin (MUCINEX DM) 30-600 MG per 12 hr tablet Take 1 tablet by mouth every 12 (twelve) hours.      . fish oil-omega-3 fatty acids 1000 MG capsule Take 2 g by mouth daily.      . hyaluronate sodium (RADIAPLEXRX) GEL Apply 1 application topically 2 (two) times daily.      Marland Kitchen levothyroxine (SYNTHROID, LEVOTHROID) 175 MCG tablet Take 175 mcg by mouth daily before breakfast.      . mometasone (NASONEX) 50 MCG/ACT nasal spray Place 2 sprays into the nose daily.      . non-metallic deodorant Thornton Papas) MISC Apply 1 application topically daily as needed.      Marland Kitchen omeprazole (PRILOSEC) 20 MG capsule TAKE ONE CAPSULE BY MOUTH EVERY DAY 30 MINUTES BEFORE MEAL  30 capsule  0  . OVER THE COUNTER MEDICATION Take 50 mcg by mouth 3 (three) times daily with meals. Calcium with K2, taking 2 with each meal.      . vitamin C (ASCORBIC ACID) 500 MG tablet Take 500 mg by mouth daily. Taking 2 tab daily      . zinc gluconate 50 MG tablet Take 50 mg by mouth daily. 30 mg       No current facility-administered medications for this encounter.     ALLERGIES: Sulfa antibiotics and Percocet   LABORATORY DATA:  Lab Results  Component Value Date   WBC 4.7 11/15/2012   HGB 14.1 11/23/2012   HCT 39.4 11/15/2012   MCV 89.1 11/15/2012   PLT 232 11/15/2012   Lab Results  Component Value Date   NA 139 11/15/2012   K 3.9 11/15/2012   CL 102 11/15/2012   CO2 25 11/15/2012   Lab Results  Component Value Date   ALT 42 11/15/2012   AST 45* 11/15/2012   ALKPHOS 129 11/15/2012   BILITOT 0.37 11/15/2012     NARRATIVE: Krista Butler was seen today for weekly treatment management. The chart was  checked and the patient's films were reviewed. The patient is doing quite well. She has noted slight pain in the nipple region. Otherwise no complaints.  PHYSICAL EXAMINATION: height is 5\' 7"  (1.702 m) and weight is 140 lb 6.4 oz (63.685 kg). Her temperature is 98.1 F (36.7 C). Her blood pressure is 98/53 and her pulse is 74.      hyperpigmentation is present diffusely in the treatment area  ASSESSMENT: The patient is doing satisfactorily with treatment.  PLAN: We will continue with the patient's radiation treatment as planned. No changes at this time. The patient is doing quite well.

## 2013-01-18 NOTE — Progress Notes (Signed)
Krista Butler here for weekly under treat visit.  She has had 19 fractions to her left breast.  She does have small twinges of pain around her left nipple area that she rates at a 1/10.  Her skin on her left breast is intact with hyperpigmentation especially over her incision line.  She has a little fatigue.  She is using radiaplex and will need another tube.

## 2013-01-19 ENCOUNTER — Ambulatory Visit
Admission: RE | Admit: 2013-01-19 | Discharge: 2013-01-19 | Disposition: A | Discharge status: Home / Self Care | Payer: BC Managed Care – PPO | Source: Ambulatory Visit | Attending: Radiation Oncology | Admitting: Radiation Oncology

## 2013-01-22 ENCOUNTER — Ambulatory Visit
Admission: RE | Admit: 2013-01-22 | Discharge: 2013-01-22 | Disposition: A | Discharge status: Home / Self Care | Payer: BC Managed Care – PPO | Source: Ambulatory Visit | Attending: Radiation Oncology | Admitting: Radiation Oncology

## 2013-01-23 ENCOUNTER — Ambulatory Visit
Admission: RE | Admit: 2013-01-23 | Discharge: 2013-01-23 | Disposition: A | Discharge status: Home / Self Care | Payer: BC Managed Care – PPO | Source: Ambulatory Visit | Attending: Radiation Oncology | Admitting: Radiation Oncology

## 2013-01-24 ENCOUNTER — Ambulatory Visit
Admission: RE | Admit: 2013-01-24 | Discharge: 2013-01-24 | Disposition: A | Discharge status: Home / Self Care | Payer: BC Managed Care – PPO | Source: Ambulatory Visit | Attending: Radiation Oncology | Admitting: Radiation Oncology

## 2013-01-25 ENCOUNTER — Ambulatory Visit
Admission: RE | Admit: 2013-01-25 | Discharge: 2013-01-25 | Disposition: A | Discharge status: Home / Self Care | Payer: BC Managed Care – PPO | Source: Ambulatory Visit | Attending: Radiation Oncology | Admitting: Radiation Oncology

## 2013-01-25 DIAGNOSIS — C50212 Malignant neoplasm of upper-inner quadrant of left female breast: Secondary | ICD-10-CM

## 2013-01-25 NOTE — Progress Notes (Signed)
  Radiation Oncology         [phone] ________________________________  Name: Krista Butler MRN: [id number]  Date: 01/12/2013  DOB: 1950-08-25  Complex simulation note  The patient has undergone complex simulation for her upcoming boost treatment for her diagnosis of breast cancer. The patient has initially been planned to receive 50 gray. The patient will now receive a 14.4 gray boost to the seroma cavity which has been contoured. This will be accomplished using an en face electron field. Based on the depth of the target area, 12 MeV electrons will be used and this field has been normalized to the 90% isodose line. The patient's final total dose therefore will be 64.4 gray. A special port plan is requested for the boost treatment.   _______________________________  [name], MD, PhD

## 2013-01-25 NOTE — Progress Notes (Signed)
   Department of Radiation Oncology  Phone:  [phone] Fax:        [phone]  Weekly Treatment Note    Name: Krista Butler Date: 01/25/2013 MRN: [id number] DOB: 04-09-1951   Current dose: 48 Gy  Current fraction: 24   MEDICATIONS: Current Outpatient Prescriptions  Medication Sig Dispense Refill  . dextromethorphan-guaiFENesin (MUCINEX DM) 30-600 MG per 12 hr tablet Take 1 tablet by mouth every 12 (twelve) hours.      . fish oil-omega-3 fatty acids 1000 MG capsule Take 2 g by mouth daily.      . hyaluronate sodium (RADIAPLEXRX) GEL Apply 1 application topically 2 (two) times daily.      Marland Kitchen levothyroxine (SYNTHROID, LEVOTHROID) 175 MCG tablet Take 175 mcg by mouth daily before breakfast.      . mometasone (NASONEX) 50 MCG/ACT nasal spray Place 2 sprays into the nose daily.      . non-metallic deodorant Thornton Papas) MISC Apply 1 application topically daily as needed.      Marland Kitchen omeprazole (PRILOSEC) 20 MG capsule TAKE ONE CAPSULE BY MOUTH EVERY DAY 30 MINUTES BEFORE MEAL  30 capsule  0  . OVER THE COUNTER MEDICATION Take 50 mcg by mouth 3 (three) times daily with meals. Calcium with K2, taking 2 with each meal.      . vitamin C (ASCORBIC ACID) 500 MG tablet Take 500 mg by mouth daily. Taking 2 tab daily      . zinc gluconate 50 MG tablet Take 50 mg by mouth daily. 30 mg       No current facility-administered medications for this encounter.     ALLERGIES: Sulfa antibiotics and Percocet   LABORATORY DATA:  Lab Results  Component Value Date   WBC 4.7 11/15/2012   HGB 14.1 11/23/2012   HCT 39.4 11/15/2012   MCV 89.1 11/15/2012   PLT 232 11/15/2012   Lab Results  Component Value Date   NA 139 11/15/2012   K 3.9 11/15/2012   CL 102 11/15/2012   CO2 25 11/15/2012   Lab Results  Component Value Date   ALT 42 11/15/2012   AST 45* 11/15/2012   ALKPHOS 129 11/15/2012   BILITOT 0.37 11/15/2012     NARRATIVE: Krista Butler was seen today for weekly treatment management. The chart was  checked and the patient's films were reviewed. The patient states that she is feeling quite well today. No real change in skin irritation. She is pleased to know that she has one more fraction for the main portion of her treatment and she will begin the boost treatment next week.  PHYSICAL EXAMINATION: vitals were not taken for this visit.     the patient's skin shows some hyperpigmentation. No significant desquamation at this time. Overall her skin looks excellent for where we are in her treatment.  ASSESSMENT: The patient is doing satisfactorily with treatment.  PLAN: We will continue with the patient's radiation treatment as planned.

## 2013-01-29 ENCOUNTER — Ambulatory Visit
Admission: RE | Admit: 2013-01-29 | Discharge: 2013-01-29 | Disposition: A | Discharge status: Home / Self Care | Payer: BC Managed Care – PPO | Source: Ambulatory Visit | Attending: Radiation Oncology | Admitting: Radiation Oncology

## 2013-01-30 ENCOUNTER — Ambulatory Visit
Admission: RE | Admit: 2013-01-30 | Discharge: 2013-01-30 | Disposition: A | Discharge status: Home / Self Care | Payer: BC Managed Care – PPO | Source: Ambulatory Visit | Attending: Radiation Oncology | Admitting: Radiation Oncology

## 2013-01-31 ENCOUNTER — Ambulatory Visit
Admission: RE | Admit: 2013-01-31 | Discharge: 2013-01-31 | Disposition: A | Discharge status: Home / Self Care | Payer: BC Managed Care – PPO | Source: Ambulatory Visit | Attending: Radiation Oncology | Admitting: Radiation Oncology

## 2013-02-01 ENCOUNTER — Ambulatory Visit
Admission: RE | Admit: 2013-02-01 | Discharge: 2013-02-01 | Disposition: A | Discharge status: Home / Self Care | Payer: BC Managed Care – PPO | Source: Ambulatory Visit | Attending: Radiation Oncology | Admitting: Radiation Oncology

## 2013-02-02 ENCOUNTER — Ambulatory Visit
Admission: RE | Admit: 2013-02-02 | Discharge: 2013-02-02 | Disposition: A | Discharge status: Home / Self Care | Payer: BC Managed Care – PPO | Source: Ambulatory Visit | Attending: Radiation Oncology | Admitting: Radiation Oncology

## 2013-02-02 ENCOUNTER — Encounter: Payer: Self-pay | Admitting: Radiation Oncology

## 2013-02-02 VITALS — BP 93/62 | Temp 97.6°F | Resp 20 | Wt 141.6 lb

## 2013-02-02 DIAGNOSIS — C50212 Malignant neoplasm of upper-inner quadrant of left female breast: Secondary | ICD-10-CM

## 2013-02-02 NOTE — Progress Notes (Signed)
Pt reports some intermittent  "shooting pains" in her left breast. Denies loss of appetite, slight fatigue recently. She is applying Radiaplex to left breast tx area for hyperpigmentation, denies desquamation. She has slight itching at times, advised she may apply 1% Cortisone cream except under her left breast. Pt verbalized understanding.

## 2013-02-02 NOTE — Progress Notes (Signed)
  Radiation Oncology         [phone] ________________________________  Name: Krista Butler MRN: [id number]  Date: 02/02/2013  DOB: 1951/06/16  Weekly Radiation Therapy Management  Current Dose: Reviewed/Approved  Narrative . . . . . . . . The patient presents for routine under treatment assessment with 4 treatments remaining.                                                      The patient is without complaint.                                 Set-up films were reviewed.                                 The chart was checked. Physical Findings. . .  weight is 141 lb 9.6 oz (64.229 kg). Her oral temperature is 97.6 F (36.4 C). Her blood pressure is 93/62. Her respiration is 20. . Weight essentially stable.  No significant changes. Impression . . . . . . . The patient is  tolerating radiation. Plan . . . . . . . . . . . . Continue treatment as planned.  ________________________________  Krista Butler. Kathrynn Running, M.D.

## 2013-02-05 ENCOUNTER — Ambulatory Visit
Admission: RE | Admit: 2013-02-05 | Discharge: 2013-02-05 | Disposition: A | Discharge status: Home / Self Care | Payer: BC Managed Care – PPO | Source: Ambulatory Visit | Attending: Radiation Oncology | Admitting: Radiation Oncology

## 2013-02-06 ENCOUNTER — Ambulatory Visit
Admission: RE | Admit: 2013-02-06 | Discharge: 2013-02-06 | Disposition: A | Discharge status: Home / Self Care | Payer: BC Managed Care – PPO | Source: Ambulatory Visit | Attending: Radiation Oncology | Admitting: Radiation Oncology

## 2013-02-07 ENCOUNTER — Ambulatory Visit
Admission: RE | Admit: 2013-02-07 | Discharge: 2013-02-07 | Disposition: A | Discharge status: Home / Self Care | Payer: BC Managed Care – PPO | Source: Ambulatory Visit | Attending: Radiation Oncology | Admitting: Radiation Oncology

## 2013-02-07 VITALS — BP 106/70 | HR 71 | Temp 97.7°F | Ht 67.0 in | Wt 141.2 lb

## 2013-02-07 DIAGNOSIS — C50212 Malignant neoplasm of upper-inner quadrant of left female breast: Secondary | ICD-10-CM

## 2013-02-07 NOTE — Progress Notes (Signed)
Krista Butler here with her friend for weekly under treat visit.  She has had 32 fractions to her left breast.  She denies pain but does have sharp twinges of pain in her left breast on occasion.  She does have fatigue and reports shortness of breath with activity.  Her oxygen saturation is 100% on room air.  The skin on her left breast is hyperpigmented with some peeling on the nipple area.  She is using radiaplex and would like a refill.  Another tube was given.

## 2013-02-07 NOTE — Progress Notes (Signed)
   Department of Radiation Oncology  Phone:  [phone] Fax:        [phone]  Weekly Treatment Note    Name: Krista Butler Date: 02/07/2013 MRN: [id number] DOB: August 08, 1950   Current dose: 62.6 Gy  Current fraction: 32   MEDICATIONS: Current Outpatient Prescriptions  Medication Sig Dispense Refill  . dextromethorphan-guaiFENesin (MUCINEX DM) 30-600 MG per 12 hr tablet Take 1 tablet by mouth every 12 (twelve) hours.      . hyaluronate sodium (RADIAPLEXRX) GEL Apply 1 application topically 2 (two) times daily.      Marland Kitchen levothyroxine (SYNTHROID, LEVOTHROID) 175 MCG tablet Take 175 mcg by mouth daily before breakfast.      . mometasone (NASONEX) 50 MCG/ACT nasal spray Place 2 sprays into the nose daily.      . non-metallic deodorant Thornton Papas) MISC Apply 1 application topically daily as needed.      Marland Kitchen omeprazole (PRILOSEC) 20 MG capsule TAKE ONE CAPSULE BY MOUTH EVERY DAY 30 MINUTES BEFORE MEAL  30 capsule  0  . OVER THE COUNTER MEDICATION Take 50 mcg by mouth 3 (three) times daily with meals. Calcium with K2, taking 2 with each meal.      . vitamin C (ASCORBIC ACID) 500 MG tablet Take 500 mg by mouth daily. Taking 2 tab daily      . zinc gluconate 50 MG tablet Take 50 mg by mouth daily. 30 mg      . fish oil-omega-3 fatty acids 1000 MG capsule Take 2 g by mouth daily.       No current facility-administered medications for this encounter.     ALLERGIES: Sulfa antibiotics and Percocet   LABORATORY DATA:  Lab Results  Component Value Date   WBC 4.7 11/15/2012   HGB 14.1 11/23/2012   HCT 39.4 11/15/2012   MCV 89.1 11/15/2012   PLT 232 11/15/2012   Lab Results  Component Value Date   NA 139 11/15/2012   K 3.9 11/15/2012   CL 102 11/15/2012   CO2 25 11/15/2012   Lab Results  Component Value Date   ALT 42 11/15/2012   AST 45* 11/15/2012   ALKPHOS 129 11/15/2012   BILITOT 0.37 11/15/2012     NARRATIVE: Krista Butler was seen today for weekly treatment management. The chart was  checked and the patient's films were reviewed. The patient is doing very well. She has one more fraction remaining. The patient has noticed some changes in the scan in the treatment area but not significant pain.  PHYSICAL EXAMINATION: height is 5\' 7"  (1.702 m) and weight is 141 lb 3.2 oz (64.048 kg). Her temperature is 97.7 F (36.5 C). Her blood pressure is 106/70 and her pulse is 71. Her oxygen saturation is 100%.      diffuse erythema/hyperpigmentation present which is greater in the boost area. No moist desquamation.  ASSESSMENT: The patient is doing satisfactorily with treatment.  PLAN: We will continue with the patient's radiation treatment as planned. The patient will continue her current skin care and she will be seen in in one month for routine followup.

## 2013-02-08 ENCOUNTER — Ambulatory Visit
Admission: RE | Admit: 2013-02-08 | Discharge: 2013-02-08 | Disposition: A | Discharge status: Home / Self Care | Payer: BC Managed Care – PPO | Source: Ambulatory Visit | Attending: Radiation Oncology | Admitting: Radiation Oncology

## 2013-02-08 ENCOUNTER — Encounter: Payer: Self-pay | Admitting: Radiation Oncology

## 2013-02-12 NOTE — Progress Notes (Signed)
  Radiation Oncology         [phone] ________________________________  Name: Krista Butler MRN: [id number]  Date: 02/08/2013  DOB: [birth year]  End of Treatment Note  Diagnosis:   DCIS of left breast     Indication for treatment:  curative       Radiation treatment dates:   12/25/12 - 02/08/13  Site/dose:   The patient was initially treated to 50 Gy at 2 Gy per fraction using whole-breast tangent fields. The patient then received a 14.4 Gy boost to the seroma cavity using an en face electron field. This utilized 12 MeV electorns. The total dose was 64.4 Gy.  Narrative: The patient tolerated radiation treatment relatively well.   The patient had moderate skin irritation towards the end of radiation.  Plan: The patient has completed radiation treatment. The patient will return to radiation oncology clinic for routine followup in one month. I advised the patient to call or return sooner if they have any questions or concerns related to their recovery or treatment. ________________________________  [name], M.D., Ph.D.

## 2013-02-12 NOTE — Addendum Note (Signed)
Encounter addended by: Jonna Coup, MD on: 02/12/2013  9:46 AM<BR>     Documentation filed: Notes Section

## 2013-02-12 NOTE — Progress Notes (Signed)
  Radiation Oncology         [phone] ________________________________  Name: Krista Butler MRN: [id number]  Date: 12/15/2012  DOB: 1950/11/12  RESPIRATORY MOTION MANAGEMENT SIMULATION  NARRATIVE:  In order to account for effect of respiratory motion on target structures and other organs in the planning and delivery of radiotherapy, this patient underwent respiratory motion management simulation.  To accomplish this, when the patient was brought to the CT simulation planning suite, 4D respiratoy motion management CT images were obtained.  The CT images were loaded into the planning software.  Then, using a variety of tools including Cine, MIP, and standard views, the target volume and planning target volumes (PTV) were delineated.  Avoidance structures were contoured.  Treatment planning then occurred.  Dose volume histograms were generated and reviewed for each of the requested structure.  The resulting plan was carefully reviewed.

## 2013-02-15 ENCOUNTER — Ambulatory Visit: Payer: BC Managed Care – PPO | Admitting: Oncology

## 2013-02-26 ENCOUNTER — Ambulatory Visit (HOSPITAL_BASED_OUTPATIENT_CLINIC_OR_DEPARTMENT_OTHER): Payer: BC Managed Care – PPO | Admitting: Oncology

## 2013-02-26 ENCOUNTER — Telehealth: Payer: Self-pay | Admitting: *Deleted

## 2013-02-26 ENCOUNTER — Encounter: Payer: Self-pay | Admitting: Oncology

## 2013-02-26 VITALS — BP 105/66 | HR 80 | Temp 97.8°F | Resp 20 | Ht 67.0 in | Wt 143.0 lb

## 2013-02-26 DIAGNOSIS — C50212 Malignant neoplasm of upper-inner quadrant of left female breast: Secondary | ICD-10-CM

## 2013-02-26 DIAGNOSIS — M810 Age-related osteoporosis without current pathological fracture: Secondary | ICD-10-CM

## 2013-02-26 DIAGNOSIS — D0590 Unspecified type of carcinoma in situ of unspecified breast: Secondary | ICD-10-CM

## 2013-02-26 MED ORDER — TAMOXIFEN CITRATE 20 MG PO TABS: 20.0000 mg | ORAL_TABLET | Freq: Every day | ORAL | Status: DC

## 2013-02-26 NOTE — Patient Instructions (Addendum)
We discussed starting Tamoxifen as soon as possible   Tamoxifen oral tablet What is this medicine? TAMOXIFEN (ta MOX i fen) blocks the effects of estrogen. It is commonly used to treat breast cancer. It is also used to decrease the chance of breast cancer coming back in women who have received treatment for the disease. It may also help prevent breast cancer in women who have a high risk of developing breast cancer. This medicine may be used for other purposes; ask your health care provider or pharmacist if you have questions. What should I tell my health care provider before I take this medicine? They need to know if you have any of these conditions: -blood clots -blood disease -cataracts or impaired eyesight -endometriosis -high calcium levels -high cholesterol -irregular menstrual cycles -liver disease -stroke -uterine fibroids -an unusual or allergic reaction to tamoxifen, other medicines, foods, dyes, or preservatives -pregnant or trying to get pregnant -breast-feeding How should I use this medicine? Take this medicine by mouth with a glass of water. Follow the directions on the prescription label. You can take it with or without food. Take your medicine at regular intervals. Do not take your medicine more often than directed. Do not stop taking except on your doctor's advice. A special MedGuide will be given to you by the pharmacist with each prescription and refill. Be sure to read this information carefully each time. Talk to your pediatrician regarding the use of this medicine in children. While this drug may be prescribed for selected conditions, precautions do apply. Overdosage: If you think you have taken too much of this medicine contact a poison control center or emergency room at once. NOTE: This medicine is only for you. Do not share this medicine with others. What if I miss a dose? If you miss a dose, take it as soon as you can. If it is almost time for your next dose,  take only that dose. Do not take double or extra doses. What may interact with this medicine? -aminoglutethimide -bromocriptine -chemotherapy drugs -female hormones, like estrogens and birth control pills -letrozole -medroxyprogesterone -phenobarbital -rifampin -warfarin This list may not describe all possible interactions. Give your health care provider a list of all the medicines, herbs, non-prescription drugs, or dietary supplements you use. Also tell them if you smoke, drink alcohol, or use illegal drugs. Some items may interact with your medicine. What should I watch for while using this medicine? Visit your doctor or health care professional for regular checks on your progress. You will need regular pelvic exams, breast exams, and mammograms. If you are taking this medicine to reduce your risk of getting breast cancer, you should know that this medicine does not prevent all types of breast cancer. If breast cancer or other problems occur, there is no guarantee that it will be found at an early stage. Do not become pregnant while taking this medicine or for 2 months after stopping this medicine. Stop taking this medicine if you get pregnant or think you are pregnant and contact your doctor. This medicine may harm your unborn baby. Women who can possibly become pregnant should use birth control methods that do not use hormones during tamoxifen treatment and for 2 months after therapy has stopped. Talk with your health care provider for birth control advice. Do not breast feed while taking this medicine. What side effects may I notice from receiving this medicine? Side effects that you should report to your doctor or health care professional as soon as possible: -  changes in vision (blurred vision) -changes in your menstrual cycle -difficulty breathing or shortness of breath -difficulty walking or talking -new breast lumps -numbness -pelvic pain or pressure -redness, blistering, peeling or  loosening of the skin, including inside the mouth -skin rash or itching (hives) -sudden chest pain -swelling of lips, face, or tongue -swelling, pain or tenderness in your calf or leg -unusual bruising or bleeding -vaginal discharge that is bloody, brown, or rust -weakness -yellowing of the whites of the eyes or skin Side effects that usually do not require medical attention (report to your doctor or health care professional if they continue or are bothersome): -fatigue -hair loss, although uncommon and is usually mild -headache -hot flashes -impotence (in men) -nausea, vomiting (mild) -vaginal discharge (white or clear) This list may not describe all possible side effects. Call your doctor for medical advice about side effects. You may report side effects to FDA at 1-800-FDA-1088. Where should I keep my medicine? Keep out of the reach of children. Store at room temperature between 20 and 25 degrees C (68 and 77 degrees F). Protect from light. Keep container tightly closed. Throw away any unused medicine after the expiration date. NOTE: This sheet is a summary. It may not cover all possible information. If you have questions about this medicine, talk to your doctor, pharmacist, or health care provider.  2012, [employer]vier/Gold Standard. (03/28/2008 12:01:56 PM)

## 2013-02-26 NOTE — Telephone Encounter (Signed)
appts made and printed...td 

## 2013-03-04 NOTE — Progress Notes (Signed)
OFFICE PROGRESS NOTE  CC  Krista Hy, MD [address] [place] [address] Dr. Dorothy Puffer  Dr. 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 Patient is seen in the multidisciplinary breast clinic for discussion of treatment options.   STAGE:  Cancer of upper-inner quadrant of female breast  Primary site: Breast (Left)  Staging method: AJCC 7th Edition  Clinical: Stage 0 (Tis (DCIS), N0, cM0)  Summary: Stage 0 (Tis (DCIS), N0, cM0)   PRIOR THERAPY: #1Patient noted some thickening of the right breast. She did not have any symptoms in the left breast. The thickening did not show any discrete mass or distortion on the right. However the patient was found to have numerous bilateral calcifications. Within the left breast there was a new linear/branching calcifications measuring 12 mm. This was suspicious for malignancy. There are also 1.1 cm suspicious calcifications in a heterogeneous group within the upper inner quadrant of the right breast. She underwent stereotactic biopsy of both of these areas  #2The biopsy on the right was negative and only noted to be a fibroadenoma with associated coarse calcifications. 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  #3 patient had a MRI performed that showed a clot area of enhancement in the retroareolar region of the left breast measuring 1 cm in maximum does mention corresponding to the known DCIS.  #4 patient is now status post left-sided lumpectomy on 11/23/2012. Her pathology revealed DCIS grade 3. Margins were negative with the closest margin being 1 mm from the inferior posterior margins. Tumor was ER ER positive PR positive.  #5 status post completion of radiation therapy to the left breast on 02/08/2013. #6 patient will proceed with after her radiation with chemoprevention with tamoxifen 20 mg daily.  But this will begin after she completes radiation therapy.  #7 NSABP B. 43 clinical study enrollment. He did meet with the nurse and her tissue will be sent for HER-2 testing. HER-2 negative and therefore did not enroll on B. 43 trial  #8 patient now will begin tamoxifen 20 mg daily for a total of 5 years   CURRENT THERAPY:begin tamoxifen 20 mg daily starting 02/26/2013  INTERVAL HISTORY: Krista PUCCIARELLI 62 y.o. female returns for followup after after completion of radiation therapy. She tolerated radiation well without any significant issues. She denies any fevers chills night sweats headaches shortness of breath chest pains palpitations no myalgias and arthralgias no vaginal bleeding or discharge. Remainder of the 10 point review of systems is negative.  MEDICAL HISTORY: Past Medical History  Diagnosis Date  . Breast cancer     left  . Thyroid disease   . Osteoporosis   . Wears glasses     ALLERGIES:  is allergic to sulfa antibiotics and percocet.  MEDICATIONS:  Current Outpatient Prescriptions  Medication Sig Dispense Refill  . dextromethorphan-guaiFENesin (MUCINEX DM) 30-600 MG per 12 hr tablet Take 1 tablet by mouth every 12 (twelve) hours.      . hyaluronate sodium (RADIAPLEXRX) GEL Apply 1 application topically 2 (two) times daily.      Marland Kitchen levothyroxine (SYNTHROID, LEVOTHROID) 175 MCG tablet Take 175 mcg by mouth daily before breakfast.      . mometasone (NASONEX) 50 MCG/ACT nasal spray Place 2 sprays into the nose daily.      . non-metallic deodorant Thornton Papas) MISC Apply 1 application topically daily as needed.      Marland Kitchen  omeprazole (PRILOSEC) 20 MG capsule TAKE ONE CAPSULE BY MOUTH EVERY DAY 30 MINUTES BEFORE MEAL  30 capsule  0  . OVER THE COUNTER MEDICATION Take 50 mcg by mouth 3 (three) times daily with meals. Calcium with K2, taking 2 with each meal.      . vitamin C (ASCORBIC ACID) 500 MG tablet Take 500 mg by mouth daily. Taking 2 tab daily      . zinc gluconate 50 MG tablet  Take 50 mg by mouth daily. 30 mg      . fish oil-omega-3 fatty acids 1000 MG capsule Take 2 g by mouth daily.      . tamoxifen (NOLVADEX) 20 MG tablet Take 1 tablet (20 mg total) by mouth daily.  90 tablet  6   No current facility-administered medications for this visit.    SURGICAL HISTORY:  Past Surgical History  Procedure Laterality Date  . Tonsillectomy    . Upper gi endoscopy  2012  . Colonoscopy    . Fracture surgery      lt arm as child  . Breast biopsy  1997    lt breast cyst  . Breast lumpectomy with needle localization Left 11/23/2012    Procedure: BREAST LUMPECTOMY WITH NEEDLE LOCALIZATION;  Surgeon: [name], MD;  Location: [facility];  Service: General;  Laterality: Left;    REVIEW OF SYSTEMS:  A comprehensive review of systems was negative.   HEALTH MAINTENANCE:   PHYSICAL EXAMINATION: Blood pressure 105/66, pulse 80, temperature 97.8 F (36.6 C), temperature source Oral, resp. rate 20, height 5\' 7"  (1.702 m), weight 143 lb (64.864 kg). Body mass index is 22.39 kg/(m^2). ECOG PERFORMANCE STATUS: 0 - Asymptomatic   well-developed well-nourished female in no acute distress HEENT exam EOMI PERRLA sclerae anicteric no conjunctival pallor oral mucosa is moist neck is supple lungs are clear cardiovascular is regular rate rhythm abdomen is soft nontender nondistended bowel sounds are present no HSM extremities no edema neuro patient's alert oriented otherwise nonfocal left breast reveals healing surgical scar no nodularity no tenderness no seroma. The right breast no masses or nipple discharge.   LABORATORY DATA: Lab Results  Component Value Date   WBC 4.7 11/15/2012   HGB 14.1 11/23/2012   HCT 39.4 11/15/2012   MCV 89.1 11/15/2012   PLT 232 11/15/2012      Chemistry      Component Value Date/Time   NA 139 11/15/2012 1224   NA 137 11/27/2010 1255   K 3.9 11/15/2012 1224   K 3.5 11/27/2010 1255   CL 102 11/15/2012 1224   CL 100 11/27/2010 1255   CO2  25 11/15/2012 1224   CO2 27 11/27/2010 1255   BUN 18.6 11/15/2012 1224   BUN 12 11/27/2010 1255   CREATININE 0.8 11/15/2012 1224   CREATININE 0.56 11/27/2010 1255      Component Value Date/Time   CALCIUM 9.8 11/15/2012 1224   CALCIUM 9.7 11/27/2010 1255   ALKPHOS 129 11/15/2012 1224   AST 45* 11/15/2012 1224   ALT 42 11/15/2012 1224   BILITOT 0.37 11/15/2012 1224       RADIOGRAPHIC STUDIES:  Mm Lt Plc Breast Loc Dev   1st Lesion  Inc Mammo Guide  11/23/2012   *RADIOLOGY REPORT*  Clinical Data:  Biopsy-proven left breast three o'clock location DCIS, preoperative needle localization  NEEDLE LOCALIZATION WITH MAMMOGRAPHIC GUIDANCE AND SPECIMEN RADIOGRAPH  Comparison:  Previous exams.  Patient presents for needle localization prior to needle localization.  I  met with the patient and we discussed the procedure of needle localization including benefits and alternatives. We discussed the high likelihood of a successful procedure. We discussed the risks of the procedure, including infection, bleeding, tissue injury, and further surgery. Informed, written consent was given.  Using mammographic guidance, sterile technique, 2% lidocaine and a 7 cm modified Kopans needle, T shaped clip was localized using a lateral to medial approach.  The films are marked for Dr. Dwain Sarna.  Specimen radiograph was performed at day Surgery, and confirms the intact clip and hookwire are present in the tissue sample.  The specimen is marked for pathology.  IMPRESSION: Needle localization left breast.  No apparent complications.   Original Report Authenticated By: Christiana Pellant, M.D.    ASSESSMENT: 62 year old female with  #1 left-sided DCIS diagnosed in April 2014 she is now status post left lumpectomy on 11/23/2012. Final pathology only revealed a DCIS that was ER positive PR negative. Postoperatively she is doing well.  #2 we discussed NSABP B. 43 clinical study days she is interested in it. She did meet with the research  nurse.  #3 patient has completed her radiation therapy on 02/08/2013.  #3  we will now plan on doing chemoprevention with tamoxifen 20 mg daily. We discussed the rationale side effects. Total of 5 years of therapy is planned.   PLAN:  #1 patient will proceed with tamoxifen 20 mg daily.  #2 she'll return in 3 months time for followup   All questions were answered. The patient knows to call the clinic with any problems, questions or concerns. We can certainly see the patient much sooner if necessary.  I spent 25 minutes counseling the patient face to face. The total time spent in the appointment was 30 minutes.    Drue Second, MD Medical/Oncology [facility] [phone] (beeper) [phone] (Office)

## 2013-03-15 ENCOUNTER — Ambulatory Visit: Payer: BC Managed Care – PPO | Admitting: Radiation Oncology

## 2013-03-21 ENCOUNTER — Encounter: Payer: Self-pay | Admitting: Oncology

## 2013-03-22 ENCOUNTER — Ambulatory Visit
Admission: RE | Admit: 2013-03-22 | Discharge: 2013-03-22 | Disposition: A | Discharge status: Home / Self Care | Payer: BC Managed Care – PPO | Source: Ambulatory Visit | Attending: Radiation Oncology | Admitting: Radiation Oncology

## 2013-03-22 VITALS — BP 89/52 | HR 79 | Temp 98.6°F | Ht 67.0 in | Wt 140.2 lb

## 2013-03-22 DIAGNOSIS — C50212 Malignant neoplasm of upper-inner quadrant of left female breast: Secondary | ICD-10-CM

## 2013-03-22 NOTE — Progress Notes (Signed)
Krista Butler here for follow up after treatment to her left breast.  She denies pain.  Her bp today was 83/50 and when it was retaken it was 89/52.  She denies dizziness and said she has eaten today.  She has started taking tamoxifen.  She denies fatigue.  Her skin is intact on her left breast with some hyperpigmentation.  She is using radiaplex gel.

## 2013-03-22 NOTE — Progress Notes (Signed)
  Radiation Oncology         [phone] ________________________________  Name: Krista Butler MRN: [id number]  Date: 03/22/2013  DOB: 1950/08/04  Follow-Up Visit Note  CC: [name], MD  [name], MD  Diagnosis:   DCIS of the left breast  Interval Since Last Radiation:  5 weeks   Narrative:  The patient returns today for routine follow-up.  The patient states that she is doing very well. Her skin has healed nicely. The patient has begun tamoxifen and she feels like she is doing satisfactorily with this new medication. The patient's blood pressure was low today in clinic. She indicates that she has been doing fine in terms of by mouth intake and she denies any dizziness.                               ALLERGIES:  is allergic to sulfa antibiotics and percocet.  Meds: Current Outpatient Prescriptions  Medication Sig Dispense Refill  . hyaluronate sodium (RADIAPLEXRX) GEL Apply 1 application topically 2 (two) times daily.      Marland Kitchen levothyroxine (SYNTHROID, LEVOTHROID) 175 MCG tablet Take 175 mcg by mouth daily before breakfast.      . mometasone (NASONEX) 50 MCG/ACT nasal spray Place 2 sprays into the nose daily.      Marland Kitchen omeprazole (PRILOSEC) 20 MG capsule TAKE ONE CAPSULE BY MOUTH EVERY DAY 30 MINUTES BEFORE MEAL  30 capsule  0  . OVER THE COUNTER MEDICATION Take 50 mcg by mouth 3 (three) times daily with meals. Calcium with K2, taking 2 with each meal.      . tamoxifen (NOLVADEX) 20 MG tablet Take 1 tablet (20 mg total) by mouth daily.  90 tablet  6  . vitamin C (ASCORBIC ACID) 500 MG tablet Take 500 mg by mouth daily. Taking 2 tab daily      . zinc gluconate 50 MG tablet Take 50 mg by mouth daily. 30 mg      . dextromethorphan-guaiFENesin (MUCINEX DM) 30-600 MG per 12 hr tablet Take 1 tablet by mouth every 12 (twelve) hours.      . fish oil-omega-3 fatty acids 1000 MG capsule Take 2 g by mouth daily.      . non-metallic deodorant Thornton Papas) MISC Apply 1 application topically  daily as needed.       No current facility-administered medications for this encounter.    Physical Findings: The patient is in no acute distress. Patient is alert and oriented.  height is 5\' 7"  (1.702 m) and weight is 140 lb 3.2 oz (63.594 kg). Her temperature is 98.6 F (37 C). Her blood pressure is 89/52 and her pulse is 79. .   The patient's skin looks very good. Some residual hyperpigmentation present in the treatment area.   Lab Findings: Lab Results  Component Value Date   WBC 4.7 11/15/2012   HGB 14.1 11/23/2012   HCT 39.4 11/15/2012   MCV 89.1 11/15/2012   PLT 232 11/15/2012     Radiographic Findings: No results found.  Impression:    The patient is doing very well after her course of radiotherapy. She has begun tamoxifen.  Plan:  The patient will followup in our clinic on a when necessary basis.   [name], M.D., Ph.D.

## 2013-05-02 ENCOUNTER — Ambulatory Visit (INDEPENDENT_AMBULATORY_CARE_PROVIDER_SITE_OTHER): Payer: BC Managed Care – PPO | Admitting: Internal Medicine

## 2013-05-02 ENCOUNTER — Encounter: Payer: Self-pay | Admitting: Internal Medicine

## 2013-05-02 VITALS — BP 102/68 | HR 72 | Ht 67.0 in | Wt 143.6 lb

## 2013-05-02 DIAGNOSIS — R195 Other fecal abnormalities: Secondary | ICD-10-CM

## 2013-05-02 DIAGNOSIS — K219 Gastro-esophageal reflux disease without esophagitis: Secondary | ICD-10-CM

## 2013-05-02 DIAGNOSIS — K5731 Diverticulosis of large intestine without perforation or abscess with bleeding: Secondary | ICD-10-CM

## 2013-05-02 MED ORDER — OMEPRAZOLE 20 MG PO CPDR: 20.0000 mg | DELAYED_RELEASE_CAPSULE | Freq: Every day | ORAL | Status: DC

## 2013-05-02 NOTE — Patient Instructions (Signed)
We have sent the following medications to your pharmacy for you to pick up at your convenience:  Omeprazole  Please follow up with Dr. Perry as needed   

## 2013-05-02 NOTE — Progress Notes (Signed)
HISTORY OF PRESENT ILLNESS:  Krista Butler is a 62 y.o. female with a history of hypothyroidism, hyperlipidemia, osteoporosis, DJD, and GERD. Previously evaluated for globus sensation. Upper endoscopy 03/11/2010 was normal except for NSAID-induced antral erosions. On PPI her globus sensation resolved. On PPI she has some reflux symptoms. She has not been seen since November 2011. She presents to the office today for followup evaluation after requesting refill of her proton pump inhibitor. Patient denies dysphagia, abdominal pain, or GI bleeding. She uses her PPI on demand, probably a few times per week at most. This is adequate for her symptom relief. She has several questions regarding intermittent problems with loose stools approximately one year lasting approximately one week or so. Also the pros and cons of Tylenol versus NSAIDs for pain. Finally, recommendations in probiotic. Her last colonoscopy was September 2006. Normal examination except for significant left-sided diverticulosis. Review of outside records shows unremarkable CBC and comprehensive metabolic panel from earlier this year.  REVIEW OF SYSTEMS:  All non-GI ROS negative except for arthritis, lower back and hip pain, sinus and allergy, nasal congestion,  Past Medical History  Diagnosis Date  . Breast cancer     left  . Thyroid disease   . Osteoporosis   . Wears glasses   . History of radiation therapy 12/25/12-02/08/13    64.4 Gy to left breast  . GERD (gastroesophageal reflux disease)     Past Surgical History  Procedure Laterality Date  . Tonsillectomy    . Upper gi endoscopy  2012  . Colonoscopy    . Fracture surgery      lt arm as child  . Breast biopsy  1997    lt breast cyst  . Breast lumpectomy with needle localization Left 11/23/2012    Procedure: BREAST LUMPECTOMY WITH NEEDLE LOCALIZATION;  Surgeon: [name], MD;  Location: [facility];  Service: General;  Laterality: Left;    Social  History Krista Butler  reports that she has never smoked. She has never used smokeless tobacco. She reports that she drinks about 1.2 ounces of alcohol per week. She reports that she does not use illicit drugs.  family history includes Heart disease in her mother; Stroke in her father.  Allergies  Allergen Reactions  . Sulfa Antibiotics Cough  . Percocet [Oxycodone-Acetaminophen]     Itching and flushed face       PHYSICAL EXAMINATION: Vital signs: BP 102/68  Pulse 72  Ht 5\' 7"  (1.702 m)  Wt 143 lb 9.6 oz (65.137 kg)  BMI 22.49 kg/m2 General: Well-developed, well-nourished, no acute distress HEENT: Sclerae are anicteric, conjunctiva pink. Oral mucosa intact Lungs: Clear Heart: Regular Abdomen: soft, nontender, nondistended, no obvious ascites, no peritoneal signs, normal bowel sounds. No organomegaly. Extremities: No edema Psychiatric: alert and oriented x3. Cooperative   ASSESSMENT:  #1. GERD. Intermittent classic symptoms and problems with globus sensation controlled with on demand use. Prior upper endoscopy with minimal findings as described #2. Short lived intermittent problems with loose stools. Nonspecific. Discussed #3. NSAIDs versus Tylenol pros and cons discussed. The value of coadministration of PPI with NSAID also discussed #4. Diverticulosis on colonoscopy 2006   PLAN:  #1. Reflux precautions #2. Continue PPI on demand. Prescribed as requested #3. May use probiotic when stools are loose. Consider Align #4. Surveillance colonoscopy around September 2016. Interval followup as needed

## 2013-05-03 ENCOUNTER — Telehealth: Payer: Self-pay | Admitting: Oncology

## 2013-05-03 NOTE — Telephone Encounter (Signed)
, °

## 2013-05-28 ENCOUNTER — Other Ambulatory Visit: Payer: BC Managed Care – PPO | Admitting: Lab

## 2013-05-28 ENCOUNTER — Ambulatory Visit: Payer: BC Managed Care – PPO | Admitting: Oncology

## 2013-06-13 ENCOUNTER — Telehealth: Payer: Self-pay | Admitting: Oncology

## 2013-06-13 ENCOUNTER — Ambulatory Visit (HOSPITAL_BASED_OUTPATIENT_CLINIC_OR_DEPARTMENT_OTHER): Payer: BC Managed Care – PPO | Admitting: Oncology

## 2013-06-13 ENCOUNTER — Other Ambulatory Visit (HOSPITAL_BASED_OUTPATIENT_CLINIC_OR_DEPARTMENT_OTHER): Payer: BC Managed Care – PPO | Admitting: Lab

## 2013-06-13 ENCOUNTER — Encounter (INDEPENDENT_AMBULATORY_CARE_PROVIDER_SITE_OTHER): Payer: Self-pay

## 2013-06-13 ENCOUNTER — Encounter: Payer: Self-pay | Admitting: Oncology

## 2013-06-13 VITALS — BP 118/77 | HR 88 | Temp 97.6°F | Resp 20 | Ht 66.25 in | Wt 143.3 lb

## 2013-06-13 DIAGNOSIS — C50212 Malignant neoplasm of upper-inner quadrant of left female breast: Secondary | ICD-10-CM

## 2013-06-13 DIAGNOSIS — D0590 Unspecified type of carcinoma in situ of unspecified breast: Secondary | ICD-10-CM

## 2013-06-13 LAB — COMPREHENSIVE METABOLIC PANEL (CC13)
Anion Gap: 11 mEq/L (ref 3–11)
CO2: 27 mEq/L (ref 22–29)
Creatinine: 0.7 mg/dL (ref 0.6–1.1)
Glucose: 87 mg/dl (ref 70–140)
Total Bilirubin: 0.39 mg/dL (ref 0.20–1.20)
Total Protein: 7.7 g/dL (ref 6.4–8.3)

## 2013-06-13 LAB — CBC WITH DIFFERENTIAL/PLATELET
BASO%: 1.2 % (ref 0.0–2.0)
Basophils Absolute: 0.1 10e3/uL (ref 0.0–0.1)
EOS%: 1.6 % (ref 0.0–7.0)
Eosinophils Absolute: 0.1 10e3/uL (ref 0.0–0.5)
HCT: 38 % (ref 34.8–46.6)
HGB: 12.7 g/dL (ref 11.6–15.9)
LYMPH%: 28.8 % (ref 14.0–49.7)
MCH: 30.5 pg (ref 25.1–34.0)
MCHC: 33.5 g/dL (ref 31.5–36.0)
MCV: 91 fL (ref 79.5–101.0)
MONO#: 0.4 10e3/uL (ref 0.1–0.9)
MONO%: 10 % (ref 0.0–14.0)
NEUT#: 2.6 10e3/uL (ref 1.5–6.5)
NEUT%: 58.4 % (ref 38.4–76.8)
Platelets: 218 10e3/uL (ref 145–400)
RBC: 4.17 10e6/uL (ref 3.70–5.45)
RDW: 12.2 % (ref 11.2–14.5)
WBC: 4.4 10e3/uL (ref 3.9–10.3)
lymph#: 1.3 10e3/uL (ref 0.9–3.3)

## 2013-06-13 NOTE — Progress Notes (Signed)
OFFICE PROGRESS NOTE  CC  Krista Hy, MD [address] [place] [address] Dr. Dorothy Puffer  Dr. 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 Patient is seen in the multidisciplinary breast clinic for discussion of treatment options.   STAGE:  Cancer of upper-inner quadrant of female breast  Primary site: Breast (Left)  Staging method: AJCC 7th Edition  Clinical: Stage 0 (Tis (DCIS), N0, cM0)  Summary: Stage 0 (Tis (DCIS), N0, cM0)   PRIOR THERAPY: #1Patient noted some thickening of the right breast. She did not have any symptoms in the left breast. The thickening did not show any discrete mass or distortion on the right. However the patient was found to have numerous bilateral calcifications. Within the left breast there was a new linear/branching calcifications measuring 12 mm. This was suspicious for malignancy. There are also 1.1 cm suspicious calcifications in a heterogeneous group within the upper inner quadrant of the right breast. She underwent stereotactic biopsy of both of these areas  #2The biopsy on the right was negative and only noted to be a fibroadenoma with associated coarse calcifications. 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  #3 patient had a MRI performed that showed a clot area of enhancement in the retroareolar region of the left breast measuring 1 cm in maximum does mention corresponding to the known DCIS.  #4 patient is now status post left-sided lumpectomy on 11/23/2012. Her pathology revealed DCIS grade 3. Margins were negative with the closest margin being 1 mm from the inferior posterior margins. Tumor was ER ER positive PR positive.  #5 status post completion of radiation therapy to the left breast on 02/08/2013. #6 patient will proceed with after her radiation with chemoprevention with tamoxifen 20 mg daily.  But this will begin after she completes radiation therapy.  #7 NSABP B. 43 clinical study enrollment. He did meet with the nurse and her tissue will be sent for HER-2 testing. HER-2 negative and therefore did not enroll on B. 43 trial  #8 patient began adjuvant curative intent tamoxifen 20 mg daily starting 02/26/2013. Total of 5 years of therapy is planned.  CURRENT THERAPY: tamoxifen 20 mg daily starting 02/26/2013  INTERVAL HISTORY: Krista Butler 62 y.o. female returns for followup she is tolerating tamoxifen well. She denies any fevers chills night sweats headaches shortness of breath chest pains palpitations no myalgias and arthralgias no vaginal bleeding or discharge. Remainder of the 10 point review of systems is negative.  MEDICAL HISTORY: Past Medical History  Diagnosis Date  . Breast cancer     left  . Thyroid disease   . Osteoporosis   . Wears glasses   . History of radiation therapy 12/25/12-02/08/13    64.4 Gy to left breast  . GERD (gastroesophageal reflux disease)     ALLERGIES:  is allergic to sulfa antibiotics and percocet.  MEDICATIONS:  Current Outpatient Prescriptions  Medication Sig Dispense Refill  . Biotin 1000 MCG tablet Take 1,000 mcg by mouth daily.      . Calcium Carb-Cholecalciferol (CALCIUM 1000 + D) 1000-800 MG-UNIT TABS Take 1 tablet by mouth daily.      Marland Kitchen dextromethorphan-guaiFENesin (MUCINEX DM) 30-600 MG per 12 hr tablet Take 1 tablet by mouth every 12 (twelve) hours.      . fish oil-omega-3 fatty acids 1000 MG capsule Take 2 g by mouth daily.      Marland Kitchen  glucosamine-chondroitin 500-400 MG tablet Take 1 tablet by mouth 3 (three) times daily.      Marland Kitchen levothyroxine (SYNTHROID, LEVOTHROID) 175 MCG tablet Take 175 mcg by mouth daily before breakfast.      . mometasone (NASONEX) 50 MCG/ACT nasal spray Place 2 sprays into the nose daily.      . non-metallic deodorant Thornton Papas) MISC Apply 1 application topically daily as needed.      Marland Kitchen omeprazole (PRILOSEC) 20 MG  capsule Take 1 capsule (20 mg total) by mouth daily.  30 capsule  6  . OVER THE COUNTER MEDICATION Take 50 mcg by mouth 3 (three) times daily with meals. Calcium with K2, taking 2 with each meal.      . Probiotic Product (PROBIOTIC DAILY) CAPS Take 1 capsule by mouth daily.      . tamoxifen (NOLVADEX) 20 MG tablet Take 1 tablet (20 mg total) by mouth daily.  90 tablet  6  . vitamin C (ASCORBIC ACID) 500 MG tablet Take 500 mg by mouth daily. Taking 2 tab daily      . zinc gluconate 50 MG tablet Take 50 mg by mouth daily. 30 mg       No current facility-administered medications for this visit.    SURGICAL HISTORY:  Past Surgical History  Procedure Laterality Date  . Tonsillectomy    . Upper gi endoscopy  2012  . Colonoscopy    . Fracture surgery      lt arm as child  . Breast biopsy  1997    lt breast cyst  . Breast lumpectomy with needle localization Left 11/23/2012    Procedure: BREAST LUMPECTOMY WITH NEEDLE LOCALIZATION;  Surgeon: [name], MD;  Location: [facility];  Service: General;  Laterality: Left;    REVIEW OF SYSTEMS:  A comprehensive review of systems was negative.   HEALTH MAINTENANCE:   PHYSICAL EXAMINATION: Blood pressure 118/77, pulse 88, temperature 97.6 F (36.4 C), temperature source Oral, resp. rate 20, height 5' 6.25" (1.683 m), weight 143 lb 4.8 oz (65 kg). Body mass index is 22.95 kg/(m^2). ECOG PERFORMANCE STATUS: 0 - Asymptomatic   well-developed well-nourished female in no acute distress HEENT exam EOMI PERRLA sclerae anicteric no conjunctival pallor oral mucosa is moist neck is supple lungs are clear cardiovascular is regular rate rhythm abdomen is soft nontender nondistended bowel sounds are present no HSM extremities no edema neuro patient's alert oriented otherwise nonfocal left breast reveals healing surgical scar no nodularity no tenderness no seroma. The right breast no masses or nipple discharge.   LABORATORY DATA: Lab  Results  Component Value Date   WBC 4.4 06/13/2013   HGB 12.7 06/13/2013   HCT 38.0 06/13/2013   MCV 91.0 06/13/2013   PLT 218 06/13/2013      Chemistry      Component Value Date/Time   NA 142 06/13/2013 1317   NA 137 11/27/2010 1255   K 4.0 06/13/2013 1317   K 3.5 11/27/2010 1255   CL 102 11/15/2012 1224   CL 100 11/27/2010 1255   CO2 27 06/13/2013 1317   CO2 27 11/27/2010 1255   BUN 13.0 06/13/2013 1317   BUN 12 11/27/2010 1255   CREATININE 0.7 06/13/2013 1317   CREATININE 0.56 11/27/2010 1255      Component Value Date/Time   CALCIUM 10.1 06/13/2013 1317   CALCIUM 9.7 11/27/2010 1255   ALKPHOS 81 06/13/2013 1317   AST 22 06/13/2013 1317   ALT 16 06/13/2013 1317  BILITOT 0.39 06/13/2013 1317       RADIOGRAPHIC STUDIES:  Mm Lt Plc Breast Loc Dev   1st Lesion  Inc Mammo Guide  11/23/2012   *RADIOLOGY REPORT*  Clinical Data:  Biopsy-proven left breast three o'clock location DCIS, preoperative needle localization  NEEDLE LOCALIZATION WITH MAMMOGRAPHIC GUIDANCE AND SPECIMEN RADIOGRAPH  Comparison:  Previous exams.  Patient presents for needle localization prior to needle localization.  I met with the patient and we discussed the procedure of needle localization including benefits and alternatives. We discussed the high likelihood of a successful procedure. We discussed the risks of the procedure, including infection, bleeding, tissue injury, and further surgery. Informed, written consent was given.  Using mammographic guidance, sterile technique, 2% lidocaine and a 7 cm modified Kopans needle, T shaped clip was localized using a lateral to medial approach.  The films are marked for Dr. Dwain Sarna.  Specimen radiograph was performed at day Surgery, and confirms the intact clip and hookwire are present in the tissue sample.  The specimen is marked for pathology.  IMPRESSION: Needle localization left breast.  No apparent complications.   Original Report Authenticated By: Christiana Pellant, M.D.     ASSESSMENT: 62 year old female with  #1 left-sided DCIS diagnosed in April 2014 she is now status post left lumpectomy on 11/23/2012. Final pathology only revealed a DCIS that was ER positive PR negative. Postoperatively she is doing well.  #2 we discussed NSABP B. 43 clinical study days she is interested in it. She did meet with the research nurse.  #3 patient has completed her radiation therapy on 02/08/2013.  #3 chemoprevention with tamoxifen 20 mg daily. We discussed the rationale side effects. Total of 5 years of therapy is planned.   PLAN:  #1 continue tamoxifen 20 mg daily  #2 she'll return in 6 months time for followup   All questions were answered. The patient knows to call the clinic with any problems, questions or concerns. We can certainly see the patient much sooner if necessary.  I spent 15 minutes counseling the patient face to face. The total time spent in the appointment was 20 minutes.    Drue Second, MD Medical/Oncology [facility] [phone] (beeper) [phone] (Office)

## 2013-10-31 DIAGNOSIS — Z124 Encounter for screening for malignant neoplasm of cervix: Secondary | ICD-10-CM | POA: Diagnosis not present

## 2013-11-26 ENCOUNTER — Other Ambulatory Visit: Payer: Self-pay | Admitting: Obstetrics and Gynecology

## 2013-11-26 DIAGNOSIS — Z853 Personal history of malignant neoplasm of breast: Secondary | ICD-10-CM

## 2013-11-26 DIAGNOSIS — Z9889 Other specified postprocedural states: Secondary | ICD-10-CM

## 2013-11-27 ENCOUNTER — Telehealth: Payer: Self-pay

## 2013-11-27 MED ORDER — OMEPRAZOLE 20 MG PO CPDR: 20.0000 mg | DELAYED_RELEASE_CAPSULE | Freq: Every day | ORAL | Status: DC

## 2013-11-27 NOTE — Telephone Encounter (Signed)
Refilled Omeprazole 

## 2013-11-28 ENCOUNTER — Other Ambulatory Visit: Payer: Self-pay

## 2013-11-28 MED ORDER — TAMOXIFEN CITRATE 20 MG PO TABS
20.0000 mg | ORAL_TABLET | Freq: Every day | ORAL | Status: DC
Start: 2013-11-28 — End: 2013-12-05

## 2013-11-28 NOTE — Telephone Encounter (Signed)
Rcvd Refill request for tamoxifen for pt.  Refill sent.  Rcpt confirmed by pharmacy.

## 2013-12-05 ENCOUNTER — Other Ambulatory Visit: Payer: Self-pay | Admitting: *Deleted

## 2013-12-05 MED ORDER — TAMOXIFEN CITRATE 20 MG PO TABS: 20.0000 mg | ORAL_TABLET | Freq: Every day | ORAL | Status: DC

## 2013-12-06 ENCOUNTER — Encounter (INDEPENDENT_AMBULATORY_CARE_PROVIDER_SITE_OTHER): Payer: Self-pay

## 2013-12-06 ENCOUNTER — Ambulatory Visit
Admission: RE | Admit: 2013-12-06 | Discharge: 2013-12-06 | Disposition: A | Discharge status: Home / Self Care | Payer: BC Managed Care – PPO | Source: Ambulatory Visit | Attending: Obstetrics and Gynecology | Admitting: Obstetrics and Gynecology

## 2013-12-06 DIAGNOSIS — R928 Other abnormal and inconclusive findings on diagnostic imaging of breast: Secondary | ICD-10-CM | POA: Diagnosis not present

## 2013-12-06 DIAGNOSIS — Z9889 Other specified postprocedural states: Secondary | ICD-10-CM

## 2013-12-06 DIAGNOSIS — Z853 Personal history of malignant neoplasm of breast: Secondary | ICD-10-CM

## 2014-01-02 ENCOUNTER — Telehealth: Payer: Self-pay

## 2014-01-02 MED ORDER — OMEPRAZOLE 20 MG PO CPDR: 20.0000 mg | DELAYED_RELEASE_CAPSULE | Freq: Every day | ORAL | Status: DC

## 2014-01-02 NOTE — Telephone Encounter (Signed)
Called [facility] pharmacy tech and verbally gave rx refill for patient's Omeprazole 20mg  over the phone.  Tech confirmed rx.  This medication does not appear to need a prior authorization.  I will call [facility] tomorrow to verify that they have processed this prescription.

## 2014-01-30 ENCOUNTER — Telehealth: Payer: Self-pay | Admitting: Hematology and Oncology

## 2014-01-30 NOTE — Telephone Encounter (Signed)
, °

## 2014-02-06 NOTE — Telephone Encounter (Signed)
Omeprazole refilled.

## 2014-02-07 ENCOUNTER — Ambulatory Visit: Payer: BC Managed Care – PPO | Admitting: Oncology

## 2014-02-07 ENCOUNTER — Other Ambulatory Visit: Payer: BC Managed Care – PPO

## 2014-03-11 ENCOUNTER — Other Ambulatory Visit: Payer: Self-pay

## 2014-03-11 DIAGNOSIS — C50219 Malignant neoplasm of upper-inner quadrant of unspecified female breast: Secondary | ICD-10-CM

## 2014-03-11 MED ORDER — TAMOXIFEN CITRATE 20 MG PO TABS: 20.0000 mg | ORAL_TABLET | Freq: Every day | ORAL | Status: DC

## 2014-03-11 NOTE — Telephone Encounter (Signed)
Pt changing pharmacy.  Let pt know prescription would be at [facility] today.    Pt voiced understanding.    Prescription sent to [facility].  Prescriptions with [facility] cancelled - per [facility] refill 8/11 was shipped.

## 2014-04-26 ENCOUNTER — Telehealth: Payer: Self-pay | Admitting: *Deleted

## 2014-04-26 NOTE — Telephone Encounter (Signed)
Pt called requesting to see Dr. Jana Hakim since Dr. Humphrey Rolls is no longer here.  Rescheduled appt and confirmed 06/24/14 appt w/ pt.

## 2014-05-09 ENCOUNTER — Ambulatory Visit: Payer: BC Managed Care – PPO | Admitting: Hematology and Oncology

## 2014-05-09 ENCOUNTER — Other Ambulatory Visit: Payer: BC Managed Care – PPO

## 2014-05-27 ENCOUNTER — Encounter: Payer: Self-pay | Admitting: Oncology

## 2014-05-30 DIAGNOSIS — Z6823 Body mass index (BMI) 23.0-23.9, adult: Secondary | ICD-10-CM | POA: Diagnosis not present

## 2014-05-30 DIAGNOSIS — R05 Cough: Secondary | ICD-10-CM | POA: Diagnosis not present

## 2014-05-30 DIAGNOSIS — J302 Other seasonal allergic rhinitis: Secondary | ICD-10-CM | POA: Diagnosis not present

## 2014-06-03 ENCOUNTER — Telehealth: Payer: Self-pay | Admitting: Oncology

## 2014-06-03 NOTE — Telephone Encounter (Signed)
per GM to disregard POF-pt req to see GM

## 2014-06-24 ENCOUNTER — Other Ambulatory Visit: Payer: BC Managed Care – PPO

## 2014-06-24 ENCOUNTER — Telehealth: Payer: Self-pay | Admitting: Oncology

## 2014-06-24 ENCOUNTER — Other Ambulatory Visit: Payer: Self-pay | Admitting: *Deleted

## 2014-06-24 ENCOUNTER — Other Ambulatory Visit: Payer: Self-pay | Admitting: Oncology

## 2014-06-24 ENCOUNTER — Ambulatory Visit (HOSPITAL_BASED_OUTPATIENT_CLINIC_OR_DEPARTMENT_OTHER): Payer: Medicare Other | Admitting: Oncology

## 2014-06-24 VITALS — BP 108/73 | HR 78 | Temp 97.7°F | Resp 18 | Ht 66.25 in | Wt 143.2 lb

## 2014-06-24 DIAGNOSIS — M859 Disorder of bone density and structure, unspecified: Secondary | ICD-10-CM | POA: Diagnosis not present

## 2014-06-24 DIAGNOSIS — C50219 Malignant neoplasm of upper-inner quadrant of unspecified female breast: Secondary | ICD-10-CM

## 2014-06-24 DIAGNOSIS — C50212 Malignant neoplasm of upper-inner quadrant of left female breast: Secondary | ICD-10-CM

## 2014-06-24 DIAGNOSIS — E042 Nontoxic multinodular goiter: Secondary | ICD-10-CM | POA: Diagnosis not present

## 2014-06-24 DIAGNOSIS — M810 Age-related osteoporosis without current pathological fracture: Secondary | ICD-10-CM | POA: Diagnosis not present

## 2014-06-24 DIAGNOSIS — Z0000 Encounter for general adult medical examination without abnormal findings: Secondary | ICD-10-CM | POA: Diagnosis not present

## 2014-06-24 DIAGNOSIS — Z853 Personal history of malignant neoplasm of breast: Secondary | ICD-10-CM

## 2014-06-24 DIAGNOSIS — R7309 Other abnormal glucose: Secondary | ICD-10-CM | POA: Diagnosis not present

## 2014-06-24 DIAGNOSIS — E785 Hyperlipidemia, unspecified: Secondary | ICD-10-CM | POA: Diagnosis not present

## 2014-06-24 NOTE — Telephone Encounter (Signed)
gv pt appt schedule for may 2016. per 11/30 pof mid may no lab.

## 2014-06-24 NOTE — Progress Notes (Signed)
OFFICE PROGRESS NOTE  CC  Krista Stack, MD [address] [place] [address] Dr. Kyung Rudd  Dr. Rolm Bookbinder Dr. Molli Posey  Chief complaint:: High grade ductal carcinoma in situ, estrogen receptor positive  CURRENT THERAPY: tamoxifen 20 mg daily starting 02/26/2013   STAGE:  Cancer of upper-inner quadrant of female breast  Primary site: Breast (Left)  Staging method: AJCC 7th Edition  Clinical: Stage 0 (Tis (DCIS), N0, cM0)  Summary: Stage 0 (Tis (DCIS), N0, cM0)   PRIOR THERAPY: #1Patient noted some thickening of the right breast. She did not have any symptoms in the left breast. The thickening did not show any discrete mass or distortion on the right. However the patient was found to have numerous bilateral calcifications. Within the left breast there was a new linear/branching calcifications measuring 12 mm. This was suspicious for malignancy. There are also 1.1 cm suspicious calcifications in a heterogeneous group within the upper inner quadrant of the right breast. She underwent stereotactic biopsy of both of these areas  #2The biopsy on the right was negative and only noted to be a fibroadenoma with associated coarse calcifications. 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  #3 patient had a MRI performed that showed a clot area of enhancement in the retroareolar region of the left breast measuring 1 cm in maximum does mention corresponding to the known DCIS.  #4 patient is now status post left-sided lumpectomy on 11/23/2012. Her pathology revealed DCIS grade 3. Margins were negative with the closest margin being 1 mm from the inferior posterior margins. Tumor was ER ER positive PR positive.  #5 status post completion of radiation therapy to the left breast on 02/08/2013. #6 patient will proceed with after her radiation with chemoprevention with tamoxifen 20 mg daily. But  this will begin after she completes radiation therapy.  #7 NSABP B. 43 clinical study enrollment. He did meet with the nurse and her tissue will be sent for HER-2 testing. HER-2 negative and therefore did not enroll on B. 43 trial  #8 patient began adjuvant curative intent tamoxifen 20 mg daily starting 02/26/2013. Total of 5 years of therapy is planned.    INTERVAL HISTORY: Krista Butler returns today for follow-up of her noninvasive breast cancer. She is establishing herself on my service today. She continues on tamoxifen, with excellent tolerance. In particular, hot flashes are not a major issue.  REVIEW OF SYSTEMS:   She does have some postmenopausal symptoms including significant vaginal dryness. She has osteoporosis. She denies problems with insomnia or mood changes. She exercises mostly by walking, and doing some weights. She just joined Community education officer" and is looking forward to doing more with that. Currently she has mild hoarseness which she attributes probably correctly to allergies. Sometimes she feels a little forgetful. This is very mild. A detailed review of systems today was otherwise noncontributory  MEDICAL HISTORY: Past Medical History  Diagnosis Date  . Breast cancer     left  . Thyroid disease   . Osteoporosis   . Wears glasses   . History of radiation therapy 12/25/12-02/08/13    62.4 Gy to left breast  . GERD (gastroesophageal reflux disease)     ALLERGIES:  is allergic to sulfa antibiotics and percocet.  MEDICATIONS:  Current Outpatient Prescriptions  Medication Sig Dispense Refill  . Biotin 1000 MCG tablet Take 1,000 mcg by mouth daily.    . Calcium Carb-Cholecalciferol (CALCIUM 1000 + D)  1000-800 MG-UNIT TABS Take 1 tablet by mouth daily.    Marland Kitchen dextromethorphan-guaiFENesin (MUCINEX DM) 30-600 MG per 12 hr tablet Take 1 tablet by mouth every 12 (twelve) hours.    . fish oil-omega-3 fatty acids 1000 MG capsule Take 2 g by mouth daily.    Marland Kitchen levothyroxine (SYNTHROID,  LEVOTHROID) 175 MCG tablet Take 175 mcg by mouth daily before breakfast.    . mometasone (NASONEX) 50 MCG/ACT nasal spray Place 2 sprays into the nose daily.    Marland Kitchen omeprazole (PRILOSEC) 20 MG capsule Take 1 capsule (20 mg total) by mouth daily. 90 capsule 1  . Probiotic Product (PROBIOTIC DAILY) CAPS Take 1 capsule by mouth daily.    . tamoxifen (NOLVADEX) 20 MG tablet Take 1 tablet (20 mg total) by mouth daily. 90 tablet 0  . vitamin C (ASCORBIC ACID) 500 MG tablet Take 500 mg by mouth daily. Taking 2 tab daily    . zinc gluconate 50 MG tablet Take 50 mg by mouth daily. 30 mg     No current facility-administered medications for this visit.    SURGICAL HISTORY:  Past Surgical History  Procedure Laterality Date  . Tonsillectomy    . Upper gi endoscopy  2012  . Colonoscopy    . Fracture surgery      lt arm as child  . Breast biopsy  1997    lt breast cyst  . Breast lumpectomy with needle localization Left 11/23/2012    Procedure: BREAST LUMPECTOMY WITH NEEDLE LOCALIZATION;  Surgeon: Rolm Bookbinder, MD;  Location: [facility];  Service: General;  Laterality: Left;   FAMILY HISTORY: The patient's father died at the age of 24 following a stroke in the setting of severe heart disease. The patient's mother died also from heart disease at the age of 26. The patient had no brothers, one sister. There is no history of breast or ovarian cancer in the family.  GYN NHISTORY: Menarche age 24, first live birth age 47. The patient is GX P2. She went through menopause in her early 38s. She took hormone replacement briefly (approximately 2 years).  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 Her husband of 40+ years, Krista Butler, used to work for [employer] but is now a Chief Strategy Officer. Daughter Krista Butler is a homemaker in [place]. Son Krista Butler is a tool and dye Mining engineer. The patient has 5 grandchildren. She attends the home church in [place]:  Colonoscopy  Pap smear  DEXA scan  Lipid panel  PHYSICAL EXAMINATION: Middle-aged white woman who appears well Blood pressure 108/73, pulse 78, temperature 97.7 F (36.5 C), temperature source Oral, resp. rate 18, height 5' 6.25" (1.683 m), weight 143 lb 3.2 oz (64.955 kg). Body mass index is 22.93 kg/(m^2).   ECOG PERFORMANCE STATUS: 0 - Asymptomatic  Sclerae unicteric, pupils equal and reactive Oropharynx clear and moist-- no thrush No cervical or supraclavicular adenopathy Lungs no rales or rhonchi Heart regular rate and rhythm Abd soft, nontender, positive bowel sounds MSK no focal spinal tenderness, no upper extremity lymphedema Neuro: nonfocal, well oriented, appropriate affect Breasts: The right breast is unremarkable. The left breast is status post lumpectomy and radiation. There is no evidence of local recurrence. Left axilla is benign.   LABORATORY DATA: Lab Results  Component Value Date   WBC 4.4 06/13/2013   HGB 12.7 06/13/2013   HCT 38.0 06/13/2013   MCV 91.0 06/13/2013   PLT 218 06/13/2013  Chemistry      Component Value Date/Time   NA 142 06/13/2013 1317   NA 137 11/27/2010 1255   K 4.0 06/13/2013 1317   K 3.5 11/27/2010 1255   CL 102 11/15/2012 1224   CL 100 11/27/2010 1255   CO2 27 06/13/2013 1317   CO2 27 11/27/2010 1255   BUN 13.0 06/13/2013 1317   BUN 12 11/27/2010 1255   CREATININE 0.7 06/13/2013 1317   CREATININE 0.56 11/27/2010 1255      Component Value Date/Time   CALCIUM 10.1 06/13/2013 1317   CALCIUM 9.7 11/27/2010 1255   ALKPHOS 81 06/13/2013 1317   AST 22 06/13/2013 1317   ALT 16 06/13/2013 1317   BILITOT 0.39 06/13/2013 1317       RADIOGRAPHIC STUDIES: CLINICAL DATA: Left lumpectomy for breast cancer 2014. This is the patient's initial post lumpectomy exam.  EXAM: DIGITAL DIAGNOSTIC bilateral MAMMOGRAM WITH CAD  COMPARISON: Prior exams  ACR Breast Density Category d: The breast tissue is  extremely dense, which lowers the sensitivity of mammography.  FINDINGS: Innumerable round calcifications and some layering calcifications are identified throughout the left breast in in multiple locations of the right breast without significant change or evidence for malignant type morphology or distribution allowing for differences in technique. Left lumpectomy changes are identified. No new suspicious finding is seen on either side.  Mammographic images were processed with CAD.  IMPRESSION: No evidence for malignancy. Left lumpectomy changes are reidentified.  RECOMMENDATION: Diagnostic mammogram is suggested in 1 year. (Code:[id number]))  I have discussed the findings and recommendations with the patient. Results were also provided in writing at the conclusion of the visit. If applicable, a reminder letter will be sent to the patient regarding the next appointment.  BI-RADS CATEGORY 2: Benign Finding(s)   Electronically Signed  By: Conchita Paris M.D.  On: 12/06/2013 11:45  ASSESSMENT:  63 y.o. Shea Stakes, [place] woman  (1) status post left lumpectomy 11/23/2012 for ductal carcinoma in situ, grade 3, estrogen receptor 100% positive, progesterone receptor 0% positive, with -4 very close margins  (2) considered  NSABP B-43  but was found to be HER-2 negative ([id number])  (3) completed adjuvant radiation therapy on 02/08/2013.  (4) tamoxifen 20 mg daily started August 2014.  (5) osteoporosis  PLAN:  I reviewed Garnell's very favorable situation with her, starting with her pathology report. She understands her cancer was not invasive, which means it was not life-threatening, since it could not spread to a vital organ. For that reason I generally do not draw lab work on my ductal carcinoma in situ patients  If the cancer were to recur, it would recur in the same breast. That is the reason she received radiation to that breast. That is a very effective way of reducing the  risk of local recurrence. She would have a very low risk of local recurrence and by taking tamoxifen she is cutting that small risk in half  Tamoxifen also cut in half the risk of developing a new breast cancer in either breast.  We then discussed common menopausal symptoms as well as the way tamoxifen interacts with those symptoms. Basically tamoxifen can worsen hot flashes although it is not doing that in this case. It tends to help the bone density problems and 2 S/P wide extent the vaginal dryness issues. While on tamoxifen and I am comfortable with her using either Estring or vaginal estrogens suppositories. She will absorb a little bit of estrogen through the vaginal wall but tamoxifen is  still effective. Of course tamoxifen can cause endometrial hyperplasia, endometrial polyps, and in rare cases cancer of the uterus.  Claudina has a good understanding of this plan. She will see me once a year for the next 4 years, in May, shortly after her mammography. She knows to call for any problems that may develop before her next visit.       Chauncey Cruel, MD 06/24/2014

## 2014-06-25 NOTE — Addendum Note (Signed)
Addended by: Laureen Abrahams on: 06/25/2014 06:27 PM   Modules accepted: Medications

## 2014-07-01 DIAGNOSIS — E785 Hyperlipidemia, unspecified: Secondary | ICD-10-CM | POA: Diagnosis not present

## 2014-07-01 DIAGNOSIS — R05 Cough: Secondary | ICD-10-CM | POA: Diagnosis not present

## 2014-07-01 DIAGNOSIS — M859 Disorder of bone density and structure, unspecified: Secondary | ICD-10-CM | POA: Diagnosis not present

## 2014-07-01 DIAGNOSIS — Z1389 Encounter for screening for other disorder: Secondary | ICD-10-CM | POA: Diagnosis not present

## 2014-07-01 DIAGNOSIS — R7309 Other abnormal glucose: Secondary | ICD-10-CM | POA: Diagnosis not present

## 2014-07-01 DIAGNOSIS — E042 Nontoxic multinodular goiter: Secondary | ICD-10-CM | POA: Diagnosis not present

## 2014-07-01 DIAGNOSIS — Z008 Encounter for other general examination: Secondary | ICD-10-CM | POA: Diagnosis not present

## 2014-07-01 DIAGNOSIS — D0512 Intraductal carcinoma in situ of left breast: Secondary | ICD-10-CM | POA: Diagnosis not present

## 2014-07-15 DIAGNOSIS — Z1212 Encounter for screening for malignant neoplasm of rectum: Secondary | ICD-10-CM | POA: Diagnosis not present

## 2014-07-22 ENCOUNTER — Other Ambulatory Visit: Payer: Self-pay | Admitting: Internal Medicine

## 2014-10-22 ENCOUNTER — Other Ambulatory Visit: Payer: Self-pay | Admitting: Internal Medicine

## 2014-10-23 ENCOUNTER — Other Ambulatory Visit: Payer: Self-pay | Admitting: *Deleted

## 2014-10-23 MED ORDER — TAMOXIFEN CITRATE 20 MG PO TABS: 20.0000 mg | ORAL_TABLET | Freq: Every day | ORAL | Status: DC

## 2014-11-19 ENCOUNTER — Encounter: Payer: Self-pay | Admitting: Internal Medicine

## 2014-12-03 ENCOUNTER — Telehealth: Payer: Self-pay | Admitting: Oncology

## 2014-12-03 ENCOUNTER — Ambulatory Visit (HOSPITAL_BASED_OUTPATIENT_CLINIC_OR_DEPARTMENT_OTHER): Payer: Medicare Other | Admitting: Oncology

## 2014-12-03 VITALS — BP 91/51 | HR 73 | Temp 98.2°F | Resp 18 | Ht 66.25 in | Wt 145.8 lb

## 2014-12-03 DIAGNOSIS — D0512 Intraductal carcinoma in situ of left breast: Secondary | ICD-10-CM

## 2014-12-03 DIAGNOSIS — C50212 Malignant neoplasm of upper-inner quadrant of left female breast: Secondary | ICD-10-CM

## 2014-12-03 DIAGNOSIS — Z170 Estrogen receptor positive status [ER+]: Secondary | ICD-10-CM

## 2014-12-03 DIAGNOSIS — M810 Age-related osteoporosis without current pathological fracture: Secondary | ICD-10-CM | POA: Diagnosis not present

## 2014-12-03 MED ORDER — TAMOXIFEN CITRATE 20 MG PO TABS: 20.0000 mg | ORAL_TABLET | Freq: Every day | ORAL | Status: DC

## 2014-12-03 NOTE — Progress Notes (Addendum)
OFFICE PROGRESS NOTE  CC  Sheela Stack, MD [address] [place] [address] Dr. Kyung Rudd  Dr. Rolm Bookbinder Dr. Molli Posey  Chief complaint:: High grade ductal carcinoma in situ, estrogen receptor positive  CURRENT THERAPY: tamoxifen 20 mg daily starting 02/26/2013   STAGE:  Cancer of upper-inner quadrant of female breast  Primary site: Breast (Left)  Staging method: AJCC 7th Edition  Clinical: Stage 0 (Tis (DCIS), N0, cM0)  Summary: Stage 0 (Tis (DCIS), N0, cM0)   PRIOR THERAPY: From Dr Dana Allan earlier note:  #1Patient noted some thickening of the right breast. She did not have any symptoms in the left breast. The thickening did not show any discrete mass or distortion on the right. However the patient was found to have numerous bilateral calcifications. Within the left breast there was a new linear/branching calcifications measuring 12 mm. This was suspicious for malignancy. There are also 1.1 cm suspicious calcifications in a heterogeneous group within the upper inner quadrant of the right breast. She underwent stereotactic biopsy of both of these areas  #2The biopsy on the right was negative and only noted to be a fibroadenoma with associated coarse calcifications. 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  #3 patient had a MRI performed that showed a clot area of enhancement in the retroareolar region of the left breast measuring 1 cm in maximum does mention corresponding to the known DCIS.  #4 patient is now status post left-sided lumpectomy on 11/23/2012. Her pathology revealed DCIS grade 3. Margins were negative with the closest margin being 1 mm from the inferior posterior margins. Tumor was ER ER positive PR positive.  #5 status post completion of radiation therapy to the left breast on 02/08/2013. #6 patient will proceed with after her radiation with  chemoprevention with tamoxifen 20 mg daily. But this will begin after she completes radiation therapy.  #7 NSABP B. 43 clinical study enrollment. He did meet with the nurse and her tissue will be sent for HER-2 testing. HER-2 negative and therefore did not enroll on B. 43 trial  #8 patient began adjuvant curative intent tamoxifen 20 mg daily starting 02/26/2013. Total of 5 years of therapy is planned.    INTERVAL HISTORY: Caleyah returns today for follow-up of her noninvasive breast cancer. The interval history is chiefly significant for her father-in-law, who is 13, moving to an assisted living situation in [place]. He had been taking care of Vonette's brother-in-law, Darin, 68, who has Down syndrome. Junie Panning is now living with Tabitha and her husband.  REVIEW OF SYSTEMS:   Timberlyn continues to tolerate tamoxifen without significant problem spray she has mild hot flashes. She does not have vaginal discharge or dryness problems. A detailed review of systems today was otherwise noncontributory  MEDICAL HISTORY: Past Medical History  Diagnosis Date  . Breast cancer     left  . Thyroid disease   . Osteoporosis   . Wears glasses   . History of radiation therapy 12/25/12-02/08/13    64.4 Gy to left breast  . GERD (gastroesophageal reflux disease)     ALLERGIES:  is allergic to sulfa antibiotics and percocet.  MEDICATIONS:  Current Outpatient Prescriptions  Medication Sig Dispense Refill  . Biotin 1000 MCG tablet Take 1,000 mcg by mouth daily.    . Calcium Carb-Cholecalciferol (CALCIUM 1000 + D) 1000-800 MG-UNIT TABS Take 1 tablet by mouth daily.    Marland Kitchen dextromethorphan-guaiFENesin ([place] DM) 30-600  MG per 12 hr tablet Take 1 tablet by mouth every 12 (twelve) hours.    . fish oil-omega-3 fatty acids 1000 MG capsule Take 2 g by mouth daily.    Marland Kitchen levothyroxine (SYNTHROID, LEVOTHROID) 175 MCG tablet Take 175 mcg by mouth daily before breakfast.    . mometasone (NASONEX) 50 MCG/ACT nasal spray Place 2  sprays into the nose daily.    Marland Kitchen omeprazole (PRILOSEC) 20 MG capsule Take 1 capsule (20 mg total) by mouth daily. 90 capsule 1  . omeprazole (PRILOSEC) 20 MG capsule TAKE ONE CAPSULE BY MOUTH ONCE DAILY 30 capsule 3  . Probiotic Product (PROBIOTIC DAILY) CAPS Take 1 capsule by mouth daily.    . tamoxifen (NOLVADEX) 20 MG tablet Take 1 tablet (20 mg total) by mouth daily. 90 tablet 2  . vitamin C (ASCORBIC ACID) 500 MG tablet Take 500 mg by mouth daily. Taking 2 tab daily    . zinc gluconate 50 MG tablet Take 50 mg by mouth daily. 30 mg     No current facility-administered medications for this visit.    SURGICAL HISTORY:  Past Surgical History  Procedure Laterality Date  . Tonsillectomy    . Upper gi endoscopy  2012  . Colonoscopy    . Fracture surgery      lt arm as child  . Breast biopsy  1997    lt breast cyst  . Breast lumpectomy with needle localization Left 11/23/2012    Procedure: BREAST LUMPECTOMY WITH NEEDLE LOCALIZATION;  Surgeon: Rolm Bookbinder, MD;  Location: [facility];  Service: General;  Laterality: Left;   FAMILY HISTORY: The patient's father died at the age of 64 following a stroke in the setting of severe heart disease. The patient's mother died also from heart disease at the age of 80. The patient had no brothers, one sister. There is no history of breast or ovarian cancer in the family.  GYN NHISTORY: Menarche age 70, first live birth age 72. The patient is GX P2. She went through menopause in her early 70s. She took hormone replacement briefly (approximately 2 years).  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 Her husband of 40+ years, Zenia Resides, used to work for [employer] but is now a Chief Strategy Officer. Daughter Geroge Baseman is a homemaker in [employer]. Son Lovey Newcomer is a tool and dye Mining engineer. The patient has 5 grandchildren. She attends the home church in [place]: Not in place  HEALTH  MAINTENANCE:  Colonoscopy  Pap smear  DEXA scan  Lipid panel  PHYSICAL EXAMINATION: Middle-aged white woman who appears well Blood pressure 91/51, pulse 73, temperature 98.2 F (36.8 C), temperature source Oral, resp. rate 18, height 5' 6.25" (1.683 m), weight 145 lb 12.8 oz (66.134 kg). Body mass index is 23.35 kg/(m^2).   ECOG PERFORMANCE STATUS: 0 - Asymptomatic  Sclerae unicteric, EOMs intact Oropharynx clear and moist-- no thrushor other lesions No cervical or supraclavicular adenopathy Lungs no rales or rhonchi Heart regular rate and rhythm Abd soft, nontender, positive bowel sounds MSK no focal spinal tenderness, no upper extremity lymphedema Neuro: nonfocal, well oriented, appropriate affect Breasts: right breast is unremarkable. The left breast is status post lumpectomy and radiation. There is no evidence of local recurrence. The left axilla is benign.    LABORATORY DATA: Lab Results  Component Value Date   WBC 4.4 06/13/2013   HGB 12.7 06/13/2013   HCT 38.0 06/13/2013   MCV 91.0 06/13/2013  PLT 218 06/13/2013      Chemistry      Component Value Date/Time   NA 142 06/13/2013 1317   NA 137 11/27/2010 1255   K 4.0 06/13/2013 1317   K 3.5 11/27/2010 1255   CL 102 11/15/2012 1224   CL 100 11/27/2010 1255   CO2 27 06/13/2013 1317   CO2 27 11/27/2010 1255   BUN 13.0 06/13/2013 1317   BUN 12 11/27/2010 1255   CREATININE 0.7 06/13/2013 1317   CREATININE 0.56 11/27/2010 1255      Component Value Date/Time   CALCIUM 10.1 06/13/2013 1317   CALCIUM 9.7 11/27/2010 1255   ALKPHOS 81 06/13/2013 1317   AST 22 06/13/2013 1317   ALT 16 06/13/2013 1317   BILITOT 0.39 06/13/2013 1317       RADIOGRAPHIC STUDIES: No results found.   ASSESSMENT:  64 y.o. Shea Stakes, [place] woman  (1) status post left lumpectomy 11/23/2012 for ductal carcinoma in situ, grade 3, estrogen receptor 100% positive, progesterone receptor 0% positive, with -4 very close margins  (2) considered   NSABP B-43  but was found to be HER-2 negative ([id number])  (3) completed adjuvant radiation therapy on 02/08/2013.  (4) tamoxifen 20 mg daily started August 2014.  (5) osteoporosis  PLAN:  Saloni is tolerating tamoxifen well, and the plan is to continue that for total of 5 years. In addition to cutting in half the risk of her noninvasive breast cancer recurring, it will cut in half the risk of developing a new breast cancer. It will also, hopefully, help with her osteoporosis.  I think she will be a good candidate for our survivorship clinic and I am scheduling her with our survivorship nurse practitioner June of next year. She then would see me again June 2018, and we would continue to "tag team her" in that fashion until she completes her 5 years of tamoxifen.  Matraca and her husband have undertaken a major task in caring for her husband's disabled brother. She is doing this with a good grace.   She knows to call for any problems that may develop before her next visithere.  Chauncey Cruel, MD 12/03/2014

## 2014-12-03 NOTE — Progress Notes (Signed)
No show

## 2014-12-03 NOTE — Addendum Note (Signed)
Addended by: Chauncey Cruel on: 12/03/2014 04:18 PM   Modules accepted: Orders, Medications, Level of Service

## 2014-12-03 NOTE — Telephone Encounter (Signed)
Message to Elzie Rings re follow up with [facility] June 2017 and then [facility] June 2018

## 2014-12-18 ENCOUNTER — Other Ambulatory Visit: Payer: Self-pay | Admitting: Obstetrics and Gynecology

## 2014-12-18 DIAGNOSIS — R921 Mammographic calcification found on diagnostic imaging of breast: Secondary | ICD-10-CM

## 2014-12-27 ENCOUNTER — Other Ambulatory Visit: Payer: Self-pay | Admitting: Obstetrics and Gynecology

## 2014-12-27 ENCOUNTER — Ambulatory Visit
Admission: RE | Admit: 2014-12-27 | Discharge: 2014-12-27 | Disposition: A | Discharge status: Home / Self Care | Payer: Medicare Other | Source: Ambulatory Visit | Attending: Obstetrics and Gynecology | Admitting: Obstetrics and Gynecology

## 2014-12-27 DIAGNOSIS — R921 Mammographic calcification found on diagnostic imaging of breast: Secondary | ICD-10-CM

## 2014-12-27 DIAGNOSIS — Z853 Personal history of malignant neoplasm of breast: Secondary | ICD-10-CM

## 2015-06-24 DIAGNOSIS — R202 Paresthesia of skin: Secondary | ICD-10-CM | POA: Diagnosis not present

## 2015-06-24 DIAGNOSIS — Z6823 Body mass index (BMI) 23.0-23.9, adult: Secondary | ICD-10-CM | POA: Diagnosis not present

## 2015-06-24 DIAGNOSIS — D0512 Intraductal carcinoma in situ of left breast: Secondary | ICD-10-CM | POA: Diagnosis not present

## 2015-06-24 DIAGNOSIS — R0602 Shortness of breath: Secondary | ICD-10-CM | POA: Diagnosis not present

## 2015-06-24 DIAGNOSIS — R079 Chest pain, unspecified: Secondary | ICD-10-CM | POA: Diagnosis not present

## 2015-06-25 ENCOUNTER — Other Ambulatory Visit: Payer: Self-pay | Admitting: Endocrinology

## 2015-06-25 DIAGNOSIS — R202 Paresthesia of skin: Secondary | ICD-10-CM

## 2015-06-25 DIAGNOSIS — R209 Unspecified disturbances of skin sensation: Principal | ICD-10-CM

## 2015-06-27 ENCOUNTER — Ambulatory Visit
Admission: RE | Admit: 2015-06-27 | Discharge: 2015-06-27 | Disposition: A | Discharge status: Home / Self Care | Payer: Medicare Other | Source: Ambulatory Visit | Attending: Endocrinology | Admitting: Endocrinology

## 2015-06-27 DIAGNOSIS — R209 Unspecified disturbances of skin sensation: Principal | ICD-10-CM

## 2015-06-27 DIAGNOSIS — R202 Paresthesia of skin: Secondary | ICD-10-CM

## 2015-06-27 MED ORDER — GADOBENATE DIMEGLUMINE 529 MG/ML IV SOLN
15.0000 mL | Freq: Once | INTRAVENOUS | Status: AC | PRN
  Administered 2015-06-27: 15 mL via INTRAVENOUS

## 2015-07-08 ENCOUNTER — Other Ambulatory Visit: Payer: Self-pay | Admitting: Endocrinology

## 2015-07-08 DIAGNOSIS — I499 Cardiac arrhythmia, unspecified: Secondary | ICD-10-CM

## 2015-07-09 ENCOUNTER — Ambulatory Visit (INDEPENDENT_AMBULATORY_CARE_PROVIDER_SITE_OTHER): Payer: Medicare Other

## 2015-07-09 DIAGNOSIS — I499 Cardiac arrhythmia, unspecified: Secondary | ICD-10-CM | POA: Diagnosis not present

## 2015-11-27 ENCOUNTER — Other Ambulatory Visit: Payer: Self-pay | Admitting: Internal Medicine

## 2015-11-27 ENCOUNTER — Other Ambulatory Visit: Payer: Self-pay | Admitting: Oncology

## 2016-01-01 ENCOUNTER — Other Ambulatory Visit: Payer: Self-pay | Admitting: Endocrinology

## 2016-01-01 DIAGNOSIS — Z853 Personal history of malignant neoplasm of breast: Secondary | ICD-10-CM

## 2016-01-09 ENCOUNTER — Ambulatory Visit
Admission: RE | Admit: 2016-01-09 | Discharge: 2016-01-09 | Disposition: A | Discharge status: Home / Self Care | Payer: Medicare Other | Source: Ambulatory Visit | Attending: Endocrinology | Admitting: Endocrinology

## 2016-01-09 DIAGNOSIS — Z853 Personal history of malignant neoplasm of breast: Secondary | ICD-10-CM

## 2016-03-04 ENCOUNTER — Other Ambulatory Visit: Payer: Self-pay | Admitting: *Deleted

## 2016-03-04 ENCOUNTER — Other Ambulatory Visit: Payer: Self-pay | Admitting: Oncology

## 2016-03-04 ENCOUNTER — Telehealth: Payer: Self-pay | Admitting: *Deleted

## 2016-03-04 DIAGNOSIS — C50212 Malignant neoplasm of upper-inner quadrant of left female breast: Secondary | ICD-10-CM

## 2016-03-04 MED ORDER — TAMOXIFEN CITRATE 20 MG PO TABS: 20.0000 mg | ORAL_TABLET | Freq: Every day | ORAL | 0 refills | Status: DC

## 2016-03-04 NOTE — Telephone Encounter (Signed)
Noted no F/U appointments scheduled with today's refill request.  She was to be seen by Survivorship in June 2017 with next F/U with Dr. Jana Hakim in June 2018.  Will notify Survivorship.

## 2016-03-04 NOTE — Telephone Encounter (Signed)
Called pt to let her know that she needs to schedule an appt to be seen by next month. No answer but left a detailed message to pt's VM that I need her to schedule an appt for yearly f/u. I told pt that I will refill tamoxifen for 1 month and look fwd to seeing her at appt. If she has any questions to call [phone] and ask to speak to this nurse. Message to be fwd to Mike Craze, NP.

## 2016-03-10 ENCOUNTER — Telehealth: Payer: Self-pay | Admitting: *Deleted

## 2016-03-10 NOTE — Telephone Encounter (Signed)
Second attempt. Called pt to schedule appt for her to see Alisa Graff. No answer but left a detailed message for my call. Told her to call this nurse back so we can get her scheduled for appt in September. Message to be fwd to G. Dawson,NP.

## 2016-03-17 ENCOUNTER — Other Ambulatory Visit: Payer: Self-pay | Admitting: *Deleted

## 2016-03-17 DIAGNOSIS — C50212 Malignant neoplasm of upper-inner quadrant of left female breast: Secondary | ICD-10-CM

## 2016-03-17 MED ORDER — TAMOXIFEN CITRATE 20 MG PO TABS: 20.0000 mg | ORAL_TABLET | Freq: Every day | ORAL | 0 refills | Status: DC

## 2016-03-18 ENCOUNTER — Telehealth: Payer: Self-pay | Admitting: Adult Health

## 2016-03-18 NOTE — Telephone Encounter (Signed)
appt made per LOS letter mailed to pt 8/24

## 2016-05-05 ENCOUNTER — Ambulatory Visit (HOSPITAL_BASED_OUTPATIENT_CLINIC_OR_DEPARTMENT_OTHER): Payer: Medicare Other | Admitting: Adult Health

## 2016-05-05 ENCOUNTER — Encounter: Payer: Self-pay | Admitting: Adult Health

## 2016-05-05 DIAGNOSIS — Z170 Estrogen receptor positive status [ER+]: Secondary | ICD-10-CM | POA: Diagnosis not present

## 2016-05-05 DIAGNOSIS — Z79810 Long term (current) use of selective estrogen receptor modulators (SERMs): Secondary | ICD-10-CM | POA: Diagnosis not present

## 2016-05-05 DIAGNOSIS — C50212 Malignant neoplasm of upper-inner quadrant of left female breast: Secondary | ICD-10-CM

## 2016-05-05 MED ORDER — TAMOXIFEN CITRATE 20 MG PO TABS: 20.0000 mg | ORAL_TABLET | Freq: Every day | ORAL | 4 refills | Status: DC

## 2016-05-05 NOTE — Progress Notes (Signed)
CLINIC:  Survivorship   REASON FOR VISIT:  Routine follow-up for history of breast cancer.   BRIEF ONCOLOGIC HISTORY:  (From last visit with Dr. Jana Hakim 12/03/14)    INTERVAL HISTORY:  Krista Butler presents to the [facility] today for routine follow-up for her history of breast cancer.  She remains on the tamoxifen with good tolerance. She endorses occasional vaginal dryness, and uses a low-dose vaginal estrogen cream for this. It is only minimally effective for her and she has some dyspareunia as a result. She has minor hot flashes, but these are not bothersome for her. She has some chronic sinus problems/allergies; she controls this with OTC medication. She has occasional heartburn, but this is nothing new as well. She tells me that she bruises easily, but again this is nothing new. She sees her PCP, Dr. Forde Dandy, regularly. Her last physical was in 01/2016. She tells me she is due for her colonoscopy this year. She declines the annual flu shot. Overall she reports feeling very well and is largely without complaints today.  Since her last visit to the cancer center, her father-in-law passed away in 28-Jul-2015. She and her husband are also caretaking Ms. Duval's very disabled brother-in-law; she was please to tell me that they have recently found daytime services to help care for him to provide respite for she and her husband.   REVIEW OF SYSTEMS:  Review of Systems  Constitutional: Negative.   HENT:       Chronic sinus and allergy issues  Eyes: Negative.   Respiratory: Negative.   Cardiovascular: Negative.   Gastrointestinal: Positive for heartburn.  Genitourinary: Negative.   Musculoskeletal: Negative.   Skin: Negative.   Neurological: Negative.   Endo/Heme/Allergies:       Hypothyroidism  Psychiatric/Behavioral: Negative.   GU: Denies vaginal bleeding, discharge, or dryness.  Breast: Denies any new nodularity, masses, tenderness, nipple changes, or nipple discharge.    A  14-point review of systems was completed and was negative, except as noted above.    PAST MEDICAL/SURGICAL HISTORY:  Past Medical History:  Diagnosis Date  . Breast cancer    left  . GERD (gastroesophageal reflux disease)   . History of radiation therapy 12/25/12-02/08/13   64.4 Gy to left breast  . Osteoporosis   . Thyroid disease   . Wears glasses    Past Surgical History:  Procedure Laterality Date  . BREAST BIOPSY  1997   lt breast cyst  . BREAST LUMPECTOMY WITH NEEDLE LOCALIZATION Left 11/23/2012   Procedure: BREAST LUMPECTOMY WITH NEEDLE LOCALIZATION;  Surgeon: Rolm Bookbinder, MD;  Location: [facility];  Service: General;  Laterality: Left;  . COLONOSCOPY    . FRACTURE SURGERY     lt arm as child  . TONSILLECTOMY    . UPPER GI ENDOSCOPY  2012     ALLERGIES:  Allergies  Allergen Reactions  . Sulfa Antibiotics Cough  . Percocet [Oxycodone-Acetaminophen]     Itching and flushed face     CURRENT MEDICATIONS:  Outpatient Encounter Prescriptions as of 05/05/2016  Medication Sig  . Biotin 1000 MCG tablet Take 1,000 mcg by mouth daily.  . Calcium Carb-Cholecalciferol (CALCIUM 1000 + D) 1000-800 MG-UNIT TABS Take 1 tablet by mouth daily.  Marland Kitchen levothyroxine (SYNTHROID, LEVOTHROID) 175 MCG tablet Take 175 mcg by mouth daily before breakfast.  . mometasone (NASONEX) 50 MCG/ACT nasal spray Place 2 sprays into the nose daily.  Marland Kitchen omeprazole (PRILOSEC) 20 MG capsule TAKE ONE CAPSULE BY MOUTH ONCE  DAILY  . Probiotic Product (PROBIOTIC DAILY) CAPS Take 1 capsule by mouth daily.  . tamoxifen (NOLVADEX) 20 MG tablet Take 1 tablet (20 mg total) by mouth daily.  Marland Kitchen zinc gluconate 50 MG tablet Take 50 mg by mouth daily. 30 mg  . [DISCONTINUED] tamoxifen (NOLVADEX) 20 MG tablet Take 1 tablet (20 mg total) by mouth daily.  . [DISCONTINUED] fish oil-omega-3 fatty acids 1000 MG capsule Take 2 g by mouth daily.  . [DISCONTINUED] vitamin C (ASCORBIC ACID) 500 MG tablet Take  500 mg by mouth daily. Taking 2 tab daily   No facility-administered encounter medications on file as of 05/05/2016.      ONCOLOGIC FAMILY HISTORY:  Family History  Problem Relation Age of Onset  . Heart disease Mother   . Stroke Father     GENETIC COUNSELING/TESTING: No records available for review.  SOCIAL HISTORY:  LAKENDA HEIT is married and lives with her husband in [place], [place]. They have 2 children, who live in the [place] area, and 5 grandchildren. She is retired; she previously worked as a Pharmacist, hospital. She denies any current tobacco or illicit drug use; she drinks alcohol occasionally.    PHYSICAL EXAMINATION:  Vital Signs: Vitals:   05/05/16 0841  BP: 104/61  Pulse: 81  Resp: 18  Temp: 98 F (36.7 C)   Filed Weights   05/05/16 0841  Weight: 136 lb 14.4 oz (62.1 kg)   General: Well-nourished, well-appearing female in no acute distress.  She is unaccompanied today.   HEENT: Head is normocephalic.  Pupils equal and reactive to light. Conjunctivae clear without exudate.  Sclerae anicteric. Oral mucosa is pink, moist.  Oropharynx is pink without lesions or erythema.  Lymph: No cervical, supraclavicular, or infraclavicular lymphadenopathy noted on palpation.  Cardiovascular: Regular rate and rhythm.Marland Kitchen Respiratory: Clear to auscultation bilaterally. Chest expansion symmetric; breathing non-labored.  Breast Exam:  -Left breast: No appreciable masses on palpation. No skin redness, thickening, or peau d'orange appearance; no nipple retraction or nipple discharge; healed lumpectomy scar without erythema or nodularity.  -Right breast: No appreciable masses on palpation. No skin redness, thickening, or peau d'orange appearance; no nipple retraction or nipple discharge. -Axilla: No axillary adenopathy bilaterally.  GI: Abdomen soft and round; non-tender, non-distended. Bowel sounds normoactive. No hepatosplenomegaly.   GU: Deferred.  Neuro: No focal deficits. Steady gait.    Psych: Mood and affect normal and appropriate for situation.  Extremities: No edema. Skin: Warm and dry.  LABORATORY DATA:  None for this visit.   DIAGNOSTIC IMAGING:  Most recent mammogram: 01/09/16    ASSESSMENT AND PLAN:  Ms.. Kendra is a pleasant 65 y.o. female with history of Stage 0 left breast invasive ductal carcinoma, ER+/PR-, diagnosed in 11/2012, treated with lumpectomy, adjuvant radiation therapy, and anti-estrogen therapy with tamoxifen beginning in 02/2013.  She presents to the Survivorship Clinic for surveillance and routine follow-up.   1. History of Stage 0 left breast cancer:  Ms. Leiman is currently clinically and radiographically without evidence of disease or recurrence of breast cancer. Her next mammogram will be due in 12/2016; orders placed today. She will follow-up with her medical oncologist, Dr. Jana Hakim, in late June/early July 2018 to coordinate with her next mammogram. She will remain on the tamoxifen; she will complete 5 years of antiestrogen therapy in 02/2018.  She continues to tolerate the tamoxifen well. She understands the importance of reporting any vaginal bleeding or any other worrisome symptoms while taking tamoxifen. I encouraged her to call me with  any questions or concerns before her next appointment at the cancer center, and I would be happy to see her sooner if needed.  2. Vaginal dryness/Dyspareunia: We discussed the role of antiestrogen plays in vaginal dryness. She understands that the low dose vaginal estrogen cream is safe to use in conjunction with tamoxifen. Given that she reports the low-dose estrogen cream does not completely alleviate her symptoms, I encouraged her to try coconut oil. I gave her instructions on how to apply and encouraged her to use the coconut oil liberally and as needed. We discussed that she can also use the coconut oil as a lubricant for intercourse. She will give this a try and let us know if it is not effective.  3. Bone  health:   Ms. Benedicto reportedly has a history of osteoporosis. She understands that tamoxifen may provide her with the positive side effect of increased bone density, when compared to aromatase inhibitors. I also encouraged her to continue her walking regimen, as well as weightbearing exercises will continue to improve her bone health. I will defer any future DEXA scan imaging to her medical oncologist or PCP, as clinically indicated.   4. Cancer screening:  Due to Ms. Katen's history and her age, she should receive screening for skin cancers, colon cancer, and gynecologic cancers. She was encouraged to follow-up with her PCP for appropriate cancer screenings.   5. Health maintenance and wellness promotion: Ms. Lascola was encouraged to consume 5-7 servings of fruits and vegetables per day. She was also encouraged to engage in moderate to vigorous exercise for 30 minutes per day most days of the week. She was instructed to limit her alcohol consumption and continue to abstain from tobacco use.    Dispo:  -Annual diagnostic mammogram due 12/2016; orders placed today. -Return to cancer center to see Dr. Jana Hakim in late 12/2016 or early 01/2017 with mammogram preceding visit.  A total of 20 minutes of face-to-face time was spent with this patient with greater than 50% of that time in counseling and care-coordination.   Mike Craze, NP Survivorship Program [place] [phone]   Note: PRIMARY CARE PROVIDER Sheela Stack, [place] [phone]

## 2016-10-29 ENCOUNTER — Other Ambulatory Visit: Payer: Self-pay | Admitting: Internal Medicine

## 2016-11-04 ENCOUNTER — Other Ambulatory Visit: Payer: Self-pay | Admitting: Internal Medicine

## 2016-12-24 ENCOUNTER — Ambulatory Visit: Payer: Medicare Other | Admitting: Internal Medicine

## 2017-01-24 ENCOUNTER — Encounter: Payer: Self-pay | Admitting: Internal Medicine

## 2017-01-24 ENCOUNTER — Ambulatory Visit (INDEPENDENT_AMBULATORY_CARE_PROVIDER_SITE_OTHER): Payer: Medicare Other | Admitting: Internal Medicine

## 2017-01-24 VITALS — BP 98/54 | HR 84 | Ht 66.25 in | Wt 144.0 lb

## 2017-01-24 DIAGNOSIS — K219 Gastro-esophageal reflux disease without esophagitis: Secondary | ICD-10-CM | POA: Diagnosis not present

## 2017-01-24 DIAGNOSIS — K5730 Diverticulosis of large intestine without perforation or abscess without bleeding: Secondary | ICD-10-CM

## 2017-01-24 DIAGNOSIS — Z1211 Encounter for screening for malignant neoplasm of colon: Secondary | ICD-10-CM | POA: Diagnosis not present

## 2017-01-24 MED ORDER — NA SULFATE-K SULFATE-MG SULF 17.5-3.13-1.6 GM/177ML PO SOLN: 1.0000 | Freq: Once | ORAL | 0 refills | Status: AC

## 2017-01-24 NOTE — Patient Instructions (Signed)

## 2017-01-24 NOTE — Progress Notes (Signed)
HISTORY OF PRESENT ILLNESS:  Krista Butler is a 66 y.o. female who has been followed in this office for GERD. She presents today with chief complaint of needing colon cancer screening and wishes to discuss options. She is referred by her primary care provider Dr. Forde Dandy She was last seen in this office October 2014 at which time she was using on demand PPI. She continues with the same. No problems with dysphagia or other issues. She has undergone colonoscopy previously in September 2006. She was found to have marked left-sided diverticulosis. The examination including intubation of the terminal ileum was otherwise normal. Follow-up in 10 years recommended. She presents at this time. She does have chronic stable IBS symptoms with alternating bowel habits and bloating. Nothing new. Trivial blood on the tissue at times with multiple bowel movements. Her GI review of systems is otherwise negative. No family history of colon cancer. She denies being anemic or having Hemoccult-positive stool.  REVIEW OF SYSTEMS:  All non-GI ROS negative except for  Past Medical History:  Diagnosis Date  . Arm fracture    LEFT arm; as a child  . Breast cancer ([facility])    left  . Diverticulosis   . GERD (gastroesophageal reflux disease)   . History of radiation therapy 12/25/12-02/08/13   64.4 Gy to left breast  . Osteoporosis   . Thyroid disease   . Wears glasses     Past Surgical History:  Procedure Laterality Date  . BREAST BIOPSY  1997   lt breast cyst  . BREAST LUMPECTOMY WITH NEEDLE LOCALIZATION Left 11/23/2012   Procedure: BREAST LUMPECTOMY WITH NEEDLE LOCALIZATION;  Surgeon: Rolm Bookbinder, MD;  Location: [facility];  Service: General;  Laterality: Left;  . COLONOSCOPY    . TONSILLECTOMY    . UPPER GI ENDOSCOPY  2012    Social History Krista Butler  reports that she has never smoked. She has never used smokeless tobacco. She reports that she drinks about 1.2 oz of alcohol per week . She  reports that she does not use drugs.  family history includes Heart disease in her mother; Stroke in her father.  Allergies  Allergen Reactions  . Sulfa Antibiotics Cough  . Percocet [Oxycodone-Acetaminophen]     Itching and flushed face       PHYSICAL EXAMINATION: Vital signs: BP (!) 98/54   Pulse 84   Ht 5' 6.25" (1.683 m)   Wt 144 lb (65.3 kg)   BMI 23.07 kg/m   Constitutional: generally well-appearing, no acute distress Psychiatric: alert and oriented x3, cooperative Eyes: extraocular movements intact, anicteric, conjunctiva pink Mouth: oral pharynx moist, no lesions Neck: supple no lymphadenopathy Cardiovascular: heart regular rate and rhythm, no murmur Lungs: clear to auscultation bilaterally Abdomen: soft, nontender, nondistended, no obvious ascites, no peritoneal signs, normal bowel sounds, no organomegaly Rectal:Deferred until colonoscopy Extremities: no clubbing cyanosis or lower extremity edema bilaterally Skin: no lesions on visible extremities Neuro: No focal deficits. Cranial nerves intact  ASSESSMENT:  #1. Colon cancer screening. Index examination September 2006 with diverticulosis. The patient had questions regarding diverticulosis. These were addressed #2. GERD. Response to on demand PPI. Previous EGD in August 2011 negative except for small antral erosions (H. pylori negative)   PLAN:  #1. Reflux precautions #2. On demand PPI #3. We discussed in detail several colon cancer screening strategies including FIT, Cologuard, and optical colonoscopy. We discussed the recommended frequency of the first 2 options if negative. We discussed that a positive result would  elicit the need for colonoscopy. We discussed potential issues regarding insurance coverage and cost several strategies and the difference between diagnostic and screening examinations. She asked multiple questions which were answered to her satisfaction. After complete imbalance discussion the patient  wished to proceed with optical colonoscopy.The nature of the procedure, as well as the risks, benefits, and alternatives were carefully and thoroughly reviewed with the patient. Ample time for discussion and questions allowed. The patient understood, was satisfied, and agreed to proceed.  A copy of this consultation and has been sent to Dr. Forde Dandy

## 2017-02-02 ENCOUNTER — Ambulatory Visit: Payer: Medicare Other | Admitting: Oncology

## 2017-03-02 ENCOUNTER — Ambulatory Visit (HOSPITAL_BASED_OUTPATIENT_CLINIC_OR_DEPARTMENT_OTHER): Payer: Medicare Other | Admitting: Oncology

## 2017-03-02 VITALS — BP 96/59 | HR 87 | Temp 98.2°F | Resp 17 | Ht 66.25 in | Wt 139.7 lb

## 2017-03-02 DIAGNOSIS — C50212 Malignant neoplasm of upper-inner quadrant of left female breast: Secondary | ICD-10-CM

## 2017-03-02 DIAGNOSIS — D0512 Intraductal carcinoma in situ of left breast: Secondary | ICD-10-CM | POA: Diagnosis not present

## 2017-03-02 DIAGNOSIS — Z79810 Long term (current) use of selective estrogen receptor modulators (SERMs): Secondary | ICD-10-CM | POA: Diagnosis not present

## 2017-03-02 DIAGNOSIS — Z170 Estrogen receptor positive status [ER+]: Secondary | ICD-10-CM

## 2017-03-02 NOTE — Progress Notes (Signed)
OFFICE PROGRESS NOTE  CC  Krista Bowen, MD [address] [place] [address] Dr. Kyung Rudd  Dr. Rolm Bookbinder Dr. Molli Posey  Chief complaint:: High grade ductal carcinoma in situ, estrogen receptor positive  CURRENT THERAPY: tamoxifen    STAGE:  Cancer of upper-inner quadrant of female breast  Primary site: Breast (Left)  Staging method: AJCC 7th Edition  Clinical: Stage 0 (Tis (DCIS), N0, cM0)  Summary: Stage 0 (Tis (DCIS), N0, cM0)   INTERVAL HISTORY: Krista Butler returns today for follow-up of her noninvasive estrogen receptor positive breast cancer. She continues on tamoxifen, with good tolerance. Hot flashes are minimal. She does not have vaginal wetness problems though she does have some vaginal dryness issues. She obtains the drug at a good price  REVIEW OF SYSTEMS:   Krista Butler is busy with her 5 grandchildren aged 79 9. She does Pilates walking and some weights for exercise. A detailed review of systems today was otherwise noncontributory  PRIOR THERAPY: From Dr Dana Allan earlier note:  #1Patient noted some thickening of the right breast. She did not have any symptoms in the left breast. The thickening did not show any discrete mass or distortion on the right. However the patient was found to have numerous bilateral calcifications. Within the left breast there was a new linear/branching calcifications measuring 12 mm. This was suspicious for malignancy. There are also 1.1 cm suspicious calcifications in a heterogeneous group within the upper inner quadrant of the right breast. She underwent stereotactic biopsy of both of these areas  #2The biopsy on the right was negative and only noted to be a fibroadenoma with associated coarse calcifications. 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  #3 patient had a MRI performed that showed a clot area of enhancement in the retroareolar  region of the left breast measuring 1 cm in maximum does mention corresponding to the known DCIS.  #4 patient is now status post left-sided lumpectomy on 11/23/2012. Her pathology revealed DCIS grade 3. Margins were negative with the closest margin being 1 mm from the inferior posterior margins. Tumor was ER ER positive PR positive.  #5 status post completion of radiation therapy to the left breast on 02/08/2013. #6 patient will proceed with after her radiation with chemoprevention with tamoxifen 20 mg daily. But this will begin after she completes radiation therapy.  #7 NSABP B. 43 clinical study enrollment. He did meet with the nurse and her tissue will be sent for HER-2 testing. HER-2 negative and therefore did not enroll on B. 43 trial  #8 patient began adjuvant curative intent tamoxifen 20 mg daily starting 02/26/2013. Total of 5 years of therapy is planned.   MEDICAL HISTORY: Past Medical History:  Diagnosis Date  . Arm fracture    LEFT arm; as a child  . Breast cancer ([facility])    left  . Diverticulosis   . GERD (gastroesophageal reflux disease)   . History of radiation therapy 12/25/12-02/08/13   64.4 Gy to left breast  . Osteoporosis   . Thyroid disease   . Wears glasses     ALLERGIES:  is allergic to sulfa antibiotics and percocet [oxycodone-acetaminophen].  MEDICATIONS:  Current Outpatient Prescriptions  Medication Sig Dispense Refill  . Biotin 1000 MCG tablet Take 1,000 mcg by mouth daily.    . Calcium Carb-Cholecalciferol (CALCIUM 1000 + D) 1000-800 MG-UNIT TABS Take 1 tablet by mouth daily.    Marland Kitchen levothyroxine (SYNTHROID, LEVOTHROID)  175 MCG tablet Take 175 mcg by mouth daily before breakfast.    . omeprazole (PRILOSEC) 20 MG capsule TAKE ONE CAPSULE BY MOUTH ONCE DAILY 30 capsule 1  . Probiotic Product (PROBIOTIC DAILY) CAPS Take 1 capsule by mouth daily.    . tamoxifen (NOLVADEX) 20 MG tablet Take 1 tablet (20 mg total) by mouth daily. 90 tablet 4  . zinc gluconate 50  MG tablet Take 50 mg by mouth daily. 30 mg     No current facility-administered medications for this visit.     SURGICAL HISTORY:  Past Surgical History:  Procedure Laterality Date  . BREAST BIOPSY  1997   lt breast cyst  . BREAST LUMPECTOMY WITH NEEDLE LOCALIZATION Left 11/23/2012   Procedure: BREAST LUMPECTOMY WITH NEEDLE LOCALIZATION;  Surgeon: Rolm Bookbinder, MD;  Location: [facility];  Service: General;  Laterality: Left;  . COLONOSCOPY    . TONSILLECTOMY    . UPPER GI ENDOSCOPY  2012   FAMILY HISTORY: The patient's father died at the age of 68 following a stroke in the setting of severe heart disease. The patient's mother died also from heart disease at the age of 8. The patient had no brothers, one sister. There is no history of breast or ovarian cancer in the family.  GYN NHISTORY: Menarche age 30, first live birth age 56. The patient is GX P2. She went through menopause in her early 3s. She took hormone replacement briefly (approximately 2 years).  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 Her husband of 40+ years, Krista Butler, used to work for [employer] but is now a Chief Strategy Officer. Daughter Krista Butler is a homemaker in [place]. Son Krista Butler is a tool and dye Mining engineer. The patient has 5 grandchildren. She attends the home church in [place]: Not in place  HEALTH MAINTENANCE:  Colonoscopy  Pap smear  DEXA scan  Lipid panel  PHYSICAL EXAMINATION: Middle-aged white woman In no acute distress Blood pressure (!) 96/59, pulse 87, temperature 98.2 F (36.8 C), temperature source Oral, resp. rate 17, height 5' 6.25" (1.683 m), weight 139 lb 11.2 oz (63.4 kg), SpO2 98 %. Body mass index is 22.38 kg/m.   ECOG PERFORMANCE STATUS: 0 - Asymptomatic  Sclerae unicteric, pupils round and equal Oropharynx clear and moist No cervical or supraclavicular adenopathy Lungs no rales or rhonchi Heart regular rate and  rhythm Abd soft, nontender, positive bowel sounds MSK no focal spinal tenderness, no upper extremity lymphedema Neuro: nonfocal, well oriented, appropriate affect Breasts: The right breast is benign. The left breast has undergone lumpectomy and radiation with no evidence of local recurrence. Both axillae are benign.  LABORATORY DATA: Lab Results  Component Value Date   WBC 4.4 06/13/2013   HGB 12.7 06/13/2013   HCT 38.0 06/13/2013   MCV 91.0 06/13/2013   PLT 218 06/13/2013      Chemistry      Component Value Date/Time   NA 142 06/13/2013 1317   K 4.0 06/13/2013 1317   CL 102 11/15/2012 1224   CO2 27 06/13/2013 1317   BUN 13.0 06/13/2013 1317   CREATININE 0.7 06/13/2013 1317      Component Value Date/Time   CALCIUM 10.1 06/13/2013 1317   ALKPHOS 81 06/13/2013 1317   AST 22 06/13/2013 1317   ALT 16 06/13/2013 1317   BILITOT 0.39 06/13/2013 1317       RADIOGRAPHIC STUDIES: She is behind on mammography this year. She has had  a reminder and assures me she will call to schedule within the next month  ASSESSMENT:  66 y.o. Krista Butler, Krista Butler woman  (1) status post left lumpectomy 11/23/2012 for ductal carcinoma in situ, grade 3, estrogen receptor 100% positive, progesterone receptor 0% positive, with -4 very close margins  (2) considered  NSABP B-43  but was found to be HER-2 negative ([id number])  (3) completed adjuvant radiation therapy on 02/08/2013.  (4) tamoxifen 20 mg daily started August 2014.  (5) osteoporosis  PLAN:  Tylia is now a little over 4 years out from definitive surgery for her breast cancer with no evidence of disease recurrence. This is very favorable.  She is tolerating tamoxifen fine. She will complete 5 years August of next year. In some cases we do continue tamoxifen for a total of 10 years and it can provide a further small reduction in risk, but we do not have that data for noninvasive cancer as a case. Accordingly she will stop tamoxifen as of August of  next year.  She is a bit behind on mammography. She assures me she will have at this month. I would like to see her after her mammography in 2019 so I will see her again in September of that year. She will "graduate" at that point.  I'm referring her to our intimacy and pelvic health program for symptomatic management of vaginal atrophy symptoms  She knows to call for any other problems that may develop before that visit.  Chauncey Cruel, MD 03/02/2017

## 2017-03-17 ENCOUNTER — Other Ambulatory Visit: Payer: Self-pay | Admitting: Internal Medicine

## 2017-04-19 ENCOUNTER — Ambulatory Visit (AMBULATORY_SURGERY_CENTER): Payer: Medicare Other | Admitting: Internal Medicine

## 2017-04-19 ENCOUNTER — Encounter: Payer: Self-pay | Admitting: Internal Medicine

## 2017-04-19 VITALS — BP 101/59 | HR 77 | Temp 97.8°F | Resp 13 | Ht 66.25 in | Wt 144.0 lb

## 2017-04-19 DIAGNOSIS — Z1211 Encounter for screening for malignant neoplasm of colon: Secondary | ICD-10-CM | POA: Diagnosis not present

## 2017-04-19 DIAGNOSIS — D122 Benign neoplasm of ascending colon: Secondary | ICD-10-CM | POA: Diagnosis not present

## 2017-04-19 MED ORDER — SODIUM CHLORIDE 0.9 % IV SOLN: 500.0000 mL | INTRAVENOUS | Status: DC

## 2017-04-19 NOTE — Patient Instructions (Signed)
**   Handouts given on diverticulosis **   YOU HAD AN ENDOSCOPIC PROCEDURE TODAY AT [facility]:   Refer to the procedure report that was given to you for any specific questions about what was found during the examination.  If the procedure report does not answer your questions, please call your gastroenterologist to clarify.  If you requested that your care partner not be given the details of your procedure findings, then the procedure report has been included in a sealed envelope for you to review at your convenience later.  YOU SHOULD EXPECT: Some feelings of bloating in the abdomen. Passage of more gas than usual.  Walking can help get rid of the air that was put into your GI tract during the procedure and reduce the bloating. If you had a lower endoscopy (such as a colonoscopy or flexible sigmoidoscopy) you may notice spotting of blood in your stool or on the toilet paper. If you underwent a bowel prep for your procedure, you may not have a normal bowel movement for a few days.  Please Note:  You might notice some irritation and congestion in your nose or some drainage.  This is from the oxygen used during your procedure.  There is no need for concern and it should clear up in a day or so.  SYMPTOMS TO REPORT IMMEDIATELY:   Following lower endoscopy (colonoscopy or flexible sigmoidoscopy):  Excessive amounts of blood in the stool  Significant tenderness or worsening of abdominal pains  Swelling of the abdomen that is new, acute  Fever of 100F or higher  For urgent or emergent issues, a gastroenterologist can be reached at any hour by calling [phone].   DIET:  We do recommend a small meal at first, but then you may proceed to your regular diet.  Drink plenty of fluids but you should avoid alcoholic beverages for 24 hours.  ACTIVITY:  You should plan to take it easy for the rest of today and you should NOT DRIVE or use heavy machinery until tomorrow (because of the  sedation medicines used during the test).    FOLLOW UP: Our staff will call the number listed on your records the next business day following your procedure to check on you and address any questions or concerns that you may have regarding the information given to you following your procedure. If we do not reach you, we will leave a message.  However, if you are feeling well and you are not experiencing any problems, there is no need to return our call.  We will assume that you have returned to your regular daily activities without incident.  If any biopsies were taken you will be contacted by phone or by letter within the next 1-3 weeks.  Please call us at [phone] if you have not heard about the biopsies in 3 weeks.    SIGNATURES/CONFIDENTIALITY: You and/or your care partner have signed paperwork which will be entered into your electronic medical record.  These signatures attest to the fact that that the information above on your After Visit Summary has been reviewed and is understood.  Full responsibility of the confidentiality of this discharge information lies with you and/or your care-partner.

## 2017-04-19 NOTE — Progress Notes (Signed)
Report to PACU, RN, vss, BBS= Clear.  

## 2017-04-19 NOTE — Progress Notes (Signed)
Called to room to assist during endoscopic procedure.  Patient ID and intended procedure confirmed with present staff. Received instructions for my participation in the procedure from the performing physician.  

## 2017-04-19 NOTE — Op Note (Signed)
[facility] Patient Name: Krista Butler Procedure Date: 04/19/2017 10:58 AM MRN: [id number] Endoscopist: Docia Chuck. Henrene Pastor , MD Age: 66 Referring MD:  Date of Birth: [birth year] Gender: Female Account #: [id number] Procedure:                Colonoscopy with cold snare polypectomy x 1 Indications:              Screening for colorectal malignant neoplasm. Index                            exam 2006 (negative) Medicines:                Monitored Anesthesia Care Procedure:                Pre-Anesthesia Assessment:                           - Prior to the procedure, a History and Physical                            was performed, and patient medications and                            allergies were reviewed. The patient's tolerance of                            previous anesthesia was also reviewed. The risks                            and benefits of the procedure and the sedation                            options and risks were discussed with the patient.                            All questions were answered, and informed consent                            was obtained. Prior Anticoagulants: The patient has                            taken no previous anticoagulant or antiplatelet                            agents. ASA Grade Assessment: II - A patient with                            mild systemic disease. After reviewing the risks                            and benefits, the patient was deemed in                            satisfactory condition to undergo the procedure.  After obtaining informed consent, the colonoscope                            was passed under direct vision. Throughout the                            procedure, the patient's blood pressure, pulse, and                            oxygen saturations were monitored continuously. The                            Colonoscope was introduced through the anus and                            advanced  to the the cecum, identified by                            appendiceal orifice and ileocecal valve. The                            ileocecal valve, appendiceal orifice, and rectum                            were photographed. The quality of the bowel                            preparation was excellent. The colonoscopy was                            performed without difficulty. The patient tolerated                            the procedure well. The bowel preparation used was                            SUPREP. Scope In: 11:08:40 AM Scope Out: 11:24:09 AM Scope Withdrawal Time: 0 hours 12 minutes 28 seconds  Total Procedure Duration: 0 hours 15 minutes 29 seconds  Findings:                 A 2 mm polyp was found in the ascending colon. The                            polyp was sessile. The polyp was removed with a                            cold snare. Resection and complete. No meaningful                            tissue available for pathologic submission.                           Multiple diverticula were found in the sigmoid  colon.                           External hemorrhoids (small).                           The exam was otherwise without abnormality on                            direct and retroflexion views. Complications:            No immediate complications. Estimated blood loss:                            None. Estimated Blood Loss:     Estimated blood loss: none. Impression:               - One 2 mm polyp in the ascending colon, removed                            with a cold snare. Resected and retrieved.                           - Diverticulosis in the sigmoid colon.                           - Small external hemorrhoids.                           - The examination was otherwise normal on direct                            and retroflexion views. Recommendation:           - Repeat colonoscopy in 10 years for surveillance.                            - Patient has a contact number available for                            emergencies. The signs and symptoms of potential                            delayed complications were discussed with the                            patient. Return to normal activities tomorrow.                            Written discharge instructions were provided to the                            patient.                           - Resume previous diet.                           -  Continue present medications. Docia Chuck. Henrene Pastor, MD 04/19/2017 11:31:26 AM This report has been signed electronically.

## 2017-04-20 ENCOUNTER — Telehealth: Payer: Self-pay | Admitting: *Deleted

## 2017-04-20 NOTE — Telephone Encounter (Signed)
  Follow up Call-  Call back number 04/19/2017  Post procedure Call Back phone  # [phone]  Permission to leave phone message Yes  Some recent data might be hidden     Patient questions:  Do you have a fever, pain , or abdominal swelling? No. Pain Score  0 *  Have you tolerated food without any problems? Yes.    Have you been able to return to your normal activities? Yes.    Do you have any questions about your discharge instructions: Diet   No. Medications  No. Follow up visit  No.  Do you have questions or concerns about your Care? No.  Actions: * If pain score is 4 or above: No action needed, pain <4.

## 2017-05-02 ENCOUNTER — Ambulatory Visit
Admission: RE | Admit: 2017-05-02 | Discharge: 2017-05-02 | Disposition: A | Discharge status: Home / Self Care | Payer: Medicare Other | Source: Ambulatory Visit | Attending: Adult Health | Admitting: Adult Health

## 2017-05-02 DIAGNOSIS — C50212 Malignant neoplasm of upper-inner quadrant of left female breast: Secondary | ICD-10-CM

## 2017-05-02 HISTORY — DX: Personal history of irradiation: Z92.3

## 2017-06-18 ENCOUNTER — Other Ambulatory Visit: Payer: Self-pay | Admitting: Internal Medicine

## 2017-06-18 ENCOUNTER — Other Ambulatory Visit: Payer: Self-pay | Admitting: Adult Health

## 2017-06-18 DIAGNOSIS — C50212 Malignant neoplasm of upper-inner quadrant of left female breast: Secondary | ICD-10-CM

## 2017-06-19 NOTE — Telephone Encounter (Signed)
Mendel Ryder,   Will you refill if appropriate?   Thanks!  Mike Craze, NP [facility] [phone]

## 2018-04-10 ENCOUNTER — Telehealth: Payer: Self-pay | Admitting: Oncology

## 2018-04-10 NOTE — Telephone Encounter (Signed)
GM PAL 9/24 - per GM moved f/u to [place] 10/1. Spoke with patient. Per patient after she checks the date of her mammo she will call back to reschedule for after mammo if needed.

## 2018-04-18 ENCOUNTER — Ambulatory Visit: Payer: Medicare Other | Admitting: Oncology

## 2018-04-24 ENCOUNTER — Other Ambulatory Visit: Payer: Self-pay | Admitting: Oncology

## 2018-04-24 DIAGNOSIS — Z853 Personal history of malignant neoplasm of breast: Secondary | ICD-10-CM

## 2018-04-25 ENCOUNTER — Telehealth: Payer: Self-pay

## 2018-04-25 ENCOUNTER — Inpatient Hospital Stay: Payer: Medicare Other | Attending: Oncology | Admitting: Adult Health

## 2018-04-25 NOTE — Telephone Encounter (Signed)
Spoke with pt about missed appt today.  Pt voiced a misunderstanding somewhere b/c she stated that she is supposed to come in after her mammogram is complete next week.  She said some one here gave her a name and number to call to schedule with Dr. Jana Hakim after MM.  Pt will be calling soon to schedule appt.

## 2018-04-25 NOTE — Progress Notes (Deleted)
OFFICE PROGRESS NOTE  CC  Krista Bowen, MD [address] [place] [address] Dr. Kyung Rudd  Dr. Rolm Bookbinder Dr. Molli Posey  Chief complaint:: High grade ductal carcinoma in situ, estrogen receptor positive  CURRENT THERAPY: tamoxifen    STAGE:  Cancer of upper-inner quadrant of female breast  Primary site: Breast (Left)  Staging method: AJCC 7th Edition  Clinical: Stage 0 (Tis (DCIS), N0, cM0)  Summary: Stage 0 (Tis (DCIS), N0, cM0)   INTERVAL HISTORY: Krista Butler   REVIEW OF SYSTEMS:   Krista Butler  PRIOR THERAPY: From Dr Dana Allan earlier note:  #1Patient noted some thickening of the right breast. She did not have any symptoms in the left breast. The thickening did not show any discrete mass or distortion on the right. However the patient was found to have numerous bilateral calcifications. Within the left breast there was a new linear/branching calcifications measuring 12 mm. This was suspicious for malignancy. There are also 1.1 cm suspicious calcifications in a heterogeneous group within the upper inner quadrant of the right breast. She underwent stereotactic biopsy of both of these areas  #2The biopsy on the right was negative and only noted to be a fibroadenoma with associated coarse calcifications. 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  #3 patient had a MRI performed that showed a clot area of enhancement in the retroareolar region of the left breast measuring 1 cm in maximum does mention corresponding to the known DCIS.  #4 patient is now status post left-sided lumpectomy on 11/23/2012. Her pathology revealed DCIS grade 3. Margins were negative with the closest margin being 1 mm from the inferior posterior margins. Tumor was ER ER positive PR positive.  #5 status post completion of radiation therapy to the left breast on 02/08/2013. #6 patient will proceed with after her  radiation with chemoprevention with tamoxifen 20 mg daily. But this will begin after she completes radiation therapy.  #7 NSABP B. 43 clinical study enrollment. He did meet with the nurse and her tissue will be sent for HER-2 testing. HER-2 negative and therefore did not enroll on B. 43 trial  #8 patient began adjuvant curative intent tamoxifen 20 mg daily starting 02/26/2013. Total of 5 years of therapy is planned.   MEDICAL HISTORY: Past Medical History:  Diagnosis Date  . Allergy    medicated for this  . Arm fracture    LEFT arm; as a child  . Breast cancer ([facility])    left  . Diverticulosis   . GERD (gastroesophageal reflux disease)   . History of radiation therapy 12/25/12-02/08/13   64.4 Gy to left breast  . Osteoporosis   . Personal history of radiation therapy   . Thyroid disease   . Wears glasses     ALLERGIES:  is allergic to sulfa antibiotics and percocet [oxycodone-acetaminophen].  MEDICATIONS:  Current Outpatient Medications  Medication Sig Dispense Refill  . Biotin 1000 MCG tablet Take 1,000 mcg by mouth daily.    . Calcium Carb-Cholecalciferol (CALCIUM 1000 + D) 1000-800 MG-UNIT TABS Take 1 tablet by mouth daily.    Marland Kitchen levothyroxine (SYNTHROID, LEVOTHROID) 175 MCG tablet Take 175 mcg by mouth daily before breakfast.    . omeprazole (PRILOSEC) 20 MG capsule TAKE 1 CAPSULE BY MOUTH ONCE DAILY 90 capsule 1  . Probiotic Product (PROBIOTIC DAILY) CAPS Take 1 capsule by mouth daily.    . tamoxifen (NOLVADEX) 20 MG tablet TAKE ONE TABLET  BY MOUTH ONCE DAILY 90 tablet 4  . zinc gluconate 50 MG tablet Take 50 mg by mouth daily. 30 mg     No current facility-administered medications for this visit.     SURGICAL HISTORY:  Past Surgical History:  Procedure Laterality Date  . BREAST BIOPSY  1997   lt breast cyst  . BREAST LUMPECTOMY Left 2014  . BREAST LUMPECTOMY WITH NEEDLE LOCALIZATION Left 11/23/2012   Procedure: BREAST LUMPECTOMY WITH NEEDLE LOCALIZATION;  Surgeon:  Rolm Bookbinder, MD;  Location: [facility];  Service: General;  Laterality: Left;  . COLONOSCOPY    . TONSILLECTOMY    . UPPER GI ENDOSCOPY  2012   FAMILY HISTORY: The patient's father died at the age of 17 following a stroke in the setting of severe heart disease. The patient's mother died also from heart disease at the age of 73. The patient had no brothers, one sister. There is no history of breast or ovarian cancer in the family.  GYN NHISTORY: Menarche age 72, first live birth age 25. The patient is GX P2. She went through menopause in her early 71s. She took hormone replacement briefly (approximately 2 years).  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 Her husband of 40+ years, Zenia Resides, used to work for [employer] but is now a Chief Strategy Officer. Daughter Geroge Baseman is a homemaker in [place]. Son Lovey Newcomer is a tool and dye Mining engineer. The patient has 5 grandchildren. She attends the home church in [place]: Not in place    PHYSICAL EXAMINATION: There were no vitals taken for this visit. There is no height or weight on file to calculate BMI.   ECOG PERFORMANCE STATUS: 0 - Asymptomatic GENERAL: Patient is a well appearing female in no acute distress HEENT:  Sclerae anicteric.  Oropharynx clear and moist. No ulcerations or evidence of oropharyngeal candidiasis. Neck is supple.  NODES:  No cervical, supraclavicular, or axillary lymphadenopathy palpated.  BREAST EXAM:  Deferred. LUNGS:  Clear to auscultation bilaterally.  No wheezes or rhonchi. HEART:  Regular rate and rhythm. No murmur appreciated. ABDOMEN:  Soft, nontender.  Positive, normoactive bowel sounds. No organomegaly palpated. MSK:  No focal spinal tenderness to palpation. Full range of motion bilaterally in the upper extremities. EXTREMITIES:  No peripheral edema.   SKIN:  Clear with no obvious rashes or skin changes. No nail dyscrasia. NEURO:  Nonfocal. Well  oriented.  Appropriate affect.   LABORATORY DATA: Lab Results  Component Value Date   WBC 4.4 06/13/2013   HGB 12.7 06/13/2013   HCT 38.0 06/13/2013   MCV 91.0 06/13/2013   PLT 218 06/13/2013      Chemistry      Component Value Date/Time   NA 142 06/13/2013 1317   K 4.0 06/13/2013 1317   CL 102 11/15/2012 1224   CO2 27 06/13/2013 1317   BUN 13.0 06/13/2013 1317   CREATININE 0.7 06/13/2013 1317      Component Value Date/Time   CALCIUM 10.1 06/13/2013 1317   ALKPHOS 81 06/13/2013 1317   AST 22 06/13/2013 1317   ALT 16 06/13/2013 1317   BILITOT 0.39 06/13/2013 1317       RADIOGRAPHIC STUDIES: She is behind on mammography this year. She has had a reminder and assures me she will call to schedule within the next month  ASSESSMENT:  67 y.o. Shea Stakes, [place] woman  (1) status post left lumpectomy 11/23/2012 for ductal carcinoma in situ, grade 3, estrogen  receptor 100% positive, progesterone receptor 0% positive, with -4 very close margins  (2) considered  NSABP B-43  but was found to be HER-2 negative ([id number])  (3) completed adjuvant radiation therapy on 02/08/2013.  (4) tamoxifen 20 mg daily started August 2014.  (5) osteoporosis  PLAN:  Enma   She knows to call for any other problems that may develop before that visit.  A total of (30) minutes of face-to-face time was spent with this patient with greater than 50% of that time in counseling and care-coordination.  Scot Dock, NP 04/25/2018

## 2018-05-03 ENCOUNTER — Ambulatory Visit
Admission: RE | Admit: 2018-05-03 | Discharge: 2018-05-03 | Disposition: A | Discharge status: Home / Self Care | Payer: Medicare Other | Source: Ambulatory Visit | Attending: Oncology | Admitting: Oncology

## 2018-05-03 ENCOUNTER — Encounter (INDEPENDENT_AMBULATORY_CARE_PROVIDER_SITE_OTHER): Payer: Self-pay

## 2018-05-03 DIAGNOSIS — Z853 Personal history of malignant neoplasm of breast: Secondary | ICD-10-CM

## 2018-05-24 ENCOUNTER — Telehealth: Payer: Self-pay | Admitting: Oncology

## 2018-05-24 NOTE — Telephone Encounter (Signed)
Scheduled appt per 10/30 sch message - pt is aware of appt date and time  

## 2018-07-11 NOTE — Progress Notes (Signed)
[facility]  [phone] [phone]    D: JLYN CERROS  DOB: [birth year]  MR#: [id number]  UTM#:[id number]   Patient Care Team: Reynold Bowen, MD as PCP - General (Endocrinology) Anjolaoluwa Siguenza, [name], MD as Consulting Physician (Oncology) Kyung Rudd, MD as Consulting Physician (Radiation Oncology) Rolm Bookbinder, MD as Consulting Physician (General Surgery) Molli Posey, MD as Consulting Physician (Obstetrics and Gynecology) OTHER MD:   Chief complaint:: High grade ductal carcinoma in situ, estrogen receptor positive  CURRENT THERAPY: Completing 5 years of tamoxifen   STAGE:  Cancer 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   The patient discontinued tamoxifen in 02/2018. She tolerated the treatment well. She had some occasional hot flashes, but she states that they were manageable.   Since her last visit here, she underwent a  Bilateral diagnostic mammogram with tomography on 05/03/2018 showing Breast Density Category C. There is no evidence of malignancy within either breast. There are stable postsurgical changes within the left breast.   REVIEW OF SYSTEMS:   Bentlie is doing well overall. Her husband's brother is in the hospital and will need surgery. She feels a pulling when she raises her arms, but states that she has not been stretching as much as she should be. The patient denies unusual headaches, visual changes, nausea, vomiting, or dizziness. There has been no unusual cough, phlegm production, or pleurisy. This been no change in bowel or bladder habits. The patient denies unexplained fatigue or unexplained weight loss, bleeding, rash, or fever. A detailed review of  systems was otherwise noncontributory.    PRIOR THERAPY: From Dr Dana Allan earlier note:  #1Patient noted some thickening of the right breast. She did not have any symptoms in the left breast. The thickening did not show any discrete mass or distortion on the right. However the patient was found to have numerous bilateral calcifications. Within the left breast there was a new linear/branching calcifications measuring 12 mm. This was suspicious for malignancy. There are also 1.1 cm suspicious calcifications in a heterogeneous group within the upper inner quadrant of the right breast. She underwent stereotactic biopsy of both of these areas  #2The biopsy on the right was negative and only noted to be a fibroadenoma with associated coarse calcifications. 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  #3 patient had a MRI performed that showed a clot area of enhancement in the retroareolar region of the left breast measuring 1 cm in maximum does mention corresponding to the known DCIS.  #4 patient is now status post left-sided lumpectomy on 11/23/2012. Her pathology revealed DCIS grade 3. Margins were negative with the closest margin being 1 mm from the inferior posterior margins. Tumor was ER ER positive PR positive.  #5 status post completion of radiation therapy to the left breast on 02/08/2013. #6 patient will proceed with after her radiation with chemoprevention with tamoxifen 20 mg daily. But this will begin after she completes radiation therapy.  #7 NSABP B. 43 clinical study enrollment. He did meet with the nurse and her tissue will be sent for HER-2 testing. HER-2 negative and therefore did not enroll on B. 43 trial  #  8 patient began adjuvant curative intent tamoxifen 20 mg daily starting 02/26/2013. Total of 5 years of therapy is planned.   MEDICAL HISTORY: Past Medical History:  Diagnosis Date  . Allergy     medicated for this  . Arm fracture    LEFT arm; as a child  . Breast cancer ([facility])    left  . Diverticulosis   . GERD (gastroesophageal reflux disease)   . History of radiation therapy 12/25/12-02/08/13   64.4 Gy to left breast  . Osteoporosis   . Personal history of radiation therapy   . Thyroid disease   . Wears glasses     ALLERGIES:  is allergic to sulfa antibiotics and percocet [oxycodone-acetaminophen].  MEDICATIONS:  Current Outpatient Medications  Medication Sig Dispense Refill  . Biotin 1000 MCG tablet Take 1,000 mcg by mouth daily.    . Calcium Carb-Cholecalciferol (CALCIUM 1000 + D) 1000-800 MG-UNIT TABS Take 1 tablet by mouth daily.    Marland Kitchen levothyroxine (SYNTHROID, LEVOTHROID) 175 MCG tablet Take 175 mcg by mouth daily before breakfast.    . omeprazole (PRILOSEC) 20 MG capsule TAKE 1 CAPSULE BY MOUTH ONCE DAILY 90 capsule 1  . Probiotic Product (PROBIOTIC DAILY) CAPS Take 1 capsule by mouth daily.    . tamoxifen (NOLVADEX) 20 MG tablet TAKE ONE TABLET BY MOUTH ONCE DAILY 90 tablet 4  . zinc gluconate 50 MG tablet Take 50 mg by mouth daily. 30 mg     No current facility-administered medications for this visit.     SURGICAL HISTORY:  Past Surgical History:  Procedure Laterality Date  . BREAST BIOPSY  1997   lt breast cyst  . BREAST LUMPECTOMY Left 2014  . BREAST LUMPECTOMY WITH NEEDLE LOCALIZATION Left 11/23/2012   Procedure: BREAST LUMPECTOMY WITH NEEDLE LOCALIZATION;  Surgeon: Rolm Bookbinder, MD;  Location: [facility];  Service: General;  Laterality: Left;  . COLONOSCOPY    . TONSILLECTOMY    . UPPER GI ENDOSCOPY  2012    FAMILY HISTORY: Family History  Problem Relation Age of Onset  . Heart disease Mother   . Stroke Father   . Colon cancer Neg Hx   . Esophageal cancer Neg Hx   . Pancreatic cancer Neg Hx   . Stomach cancer Neg Hx   . Liver disease Neg Hx    The patient's father died at the age of 67 following a stroke in the setting  of severe heart disease. The patient's mother died also from heart disease at the age of 13. The patient had no brothers, one sister. There is no history of breast or ovarian cancer in the family.   GYN NHISTORY: Menarche age 40, first live birth age 12. The patient is GX P2. She went through menopause in her early 17s. She took hormone replacement briefly (approximately 2 years).   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 Her husband of 40+ years, Zenia Resides, used to work for [employer] but is now a Chief Strategy Officer. Daughter Geroge Baseman is a homemaker in [place]. Son Lovey Newcomer is a tool and dye Mining engineer. The patient has 5 grandchildren. She has one great grandchild. She attends the home church in [place].   ADVANCED DIRECTIVES: Not in place   HEALTH MAINTENANCE:  Colonoscopy  Pap smear  DEXA scan  Lipid panel   PHYSICAL EXAMINATION: Middle-aged white woman who appears well Blood pressure 110/66, pulse 65, temperature (!) 97.5 F (36.4 C), temperature source Oral, resp. rate  18, weight 144 lb 4.8 oz (65.5 kg), SpO2 98 %. Body mass index is 23.12 kg/m.   ECOG PERFORMANCE STATUS: 0 - Asymptomatic  Sclerae unicteric, EOMs intact No cervical or supraclavicular adenopathy Lungs no rales or rhonchi Heart regular rate and rhythm Abd soft, nontender, positive bowel sounds MSK no focal spinal tenderness, no upper extremity lymphedema Neuro: nonfocal, well oriented, appropriate affect Breasts: Right breast is unremarkable.  The left breast is status post lumpectomy and radiation.  There is no evidence of disease recurrence.  Both axillae are benign.  LABORATORY DATA: Lab Results  Component Value Date   WBC 5.1 07/12/2018   HGB 13.2 07/12/2018   HCT 40.5 07/12/2018   MCV 93.1 07/12/2018   PLT 228 07/12/2018      Chemistry      Component Value Date/Time   NA 142 06/13/2013 1317   K 4.0 06/13/2013 1317   CL 102 11/15/2012 1224   CO2 27  06/13/2013 1317   BUN 13.0 06/13/2013 1317   CREATININE 0.7 06/13/2013 1317      Component Value Date/Time   CALCIUM 10.1 06/13/2013 1317   ALKPHOS 81 06/13/2013 1317   AST 22 06/13/2013 1317   ALT 16 06/13/2013 1317   BILITOT 0.39 06/13/2013 1317       RESULTS: No results found.   ASSESSMENT:  67 y.o. Shea Stakes, [place] woman  (1) status post left lumpectomy 11/23/2012 for ductal carcinoma in situ, grade 3, estrogen receptor 100% positive, progesterone receptor 0% positive, with -4 very close margins  (2) considered  NSABP B-43  but was found to be HER-2 negative ([id number])  (3) completed adjuvant radiation therapy on 02/08/2013.  (4) tamoxifen 20 mg daily started August 2014, discontinued August 2019  (5) osteoporosis   PLAN:  Layni is now 5-1/2 years out from definitive surgery for breast cancer with no evidence of disease recurrence.  This is very favorable.  She completed 5 years of tamoxifen.  She understands that may have helped her bones although she tells me she recently had a bone density which was a little bit worse and she is considering bisphosphonates or denosumab.  By taking tamoxifen for 5 years she cut in half her risk of this cancer returning or a new breast cancer developing  At this point I feel comfortable releasing her to her primary care physicians care.  All she will need in terms of breast cancer follow-up is a yearly physician breast exam and yearly mammogram, preferably with tomography  We will be glad to see Valeree again at any point in the future if and when the need arises but as of now are making no further routine appointments for her here.   Vickey Boak, [name], MD  07/12/18 1:03 PM Medical Oncology and Hematology [facility] [address] [place], [place] [address] Tel. [phone]    Fax. [phone]   I, Jacqualyn Posey am acting as a Education administrator for Chauncey Cruel, MD.   I, Lurline Del MD, have reviewed the above  documentation for accuracy and completeness, and I agree with the above.

## 2018-07-12 ENCOUNTER — Inpatient Hospital Stay: Payer: Medicare Other

## 2018-07-12 ENCOUNTER — Inpatient Hospital Stay: Payer: Medicare Other | Attending: Oncology | Admitting: Oncology

## 2018-07-12 VITALS — BP 110/66 | HR 65 | Temp 97.5°F | Resp 18 | Wt 144.3 lb

## 2018-07-12 DIAGNOSIS — Z79899 Other long term (current) drug therapy: Secondary | ICD-10-CM | POA: Diagnosis not present

## 2018-07-12 DIAGNOSIS — Z923 Personal history of irradiation: Secondary | ICD-10-CM | POA: Diagnosis not present

## 2018-07-12 DIAGNOSIS — Z170 Estrogen receptor positive status [ER+]: Secondary | ICD-10-CM | POA: Diagnosis not present

## 2018-07-12 DIAGNOSIS — Z86000 Personal history of in-situ neoplasm of breast: Secondary | ICD-10-CM | POA: Diagnosis present

## 2018-07-12 DIAGNOSIS — C50212 Malignant neoplasm of upper-inner quadrant of left female breast: Secondary | ICD-10-CM

## 2018-07-12 LAB — CBC WITH DIFFERENTIAL/PLATELET
Abs Immature Granulocytes: 0.01 10*3/uL (ref 0.00–0.07)
Basophils Absolute: 0 10*3/uL (ref 0.0–0.1)
Basophils Relative: 1 %
EOS PCT: 2 %
Eosinophils Absolute: 0.1 10*3/uL (ref 0.0–0.5)
HCT: 40.5 % (ref 36.0–46.0)
HEMOGLOBIN: 13.2 g/dL (ref 12.0–15.0)
Immature Granulocytes: 0 %
LYMPHS ABS: 2 10*3/uL (ref 0.7–4.0)
LYMPHS PCT: 39 %
MCH: 30.3 pg (ref 26.0–34.0)
MCHC: 32.6 g/dL (ref 30.0–36.0)
MCV: 93.1 fL (ref 80.0–100.0)
MONO ABS: 0.4 10*3/uL (ref 0.1–1.0)
MONOS PCT: 8 %
Neutro Abs: 2.6 10*3/uL (ref 1.7–7.7)
Neutrophils Relative %: 50 %
Platelets: 228 10*3/uL (ref 150–400)
RBC: 4.35 MIL/uL (ref 3.87–5.11)
RDW: 11.8 % (ref 11.5–15.5)
WBC: 5.1 10*3/uL (ref 4.0–10.5)
nRBC: 0 % (ref 0.0–0.2)

## 2018-07-12 LAB — COMPREHENSIVE METABOLIC PANEL
ALBUMIN: 4 g/dL (ref 3.5–5.0)
ALT: 24 U/L (ref 0–44)
AST: 25 U/L (ref 15–41)
Alkaline Phosphatase: 98 U/L (ref 38–126)
Anion gap: 11 (ref 5–15)
BUN: 13 mg/dL (ref 8–23)
CHLORIDE: 103 mmol/L (ref 98–111)
CO2: 25 mmol/L (ref 22–32)
CREATININE: 0.77 mg/dL (ref 0.44–1.00)
Calcium: 9.5 mg/dL (ref 8.9–10.3)
GFR calc Af Amer: >60 mL/min (ref >60)
GLUCOSE: 126 mg/dL — AB (ref 70–99)
Potassium: 4.5 mmol/L (ref 3.5–5.1)
Sodium: 139 mmol/L (ref 135–145)
Total Bilirubin: 0.4 mg/dL (ref 0.3–1.2)
Total Protein: 7.5 g/dL (ref 6.5–8.1)

## 2018-07-13 ENCOUNTER — Telehealth: Payer: Self-pay | Admitting: Oncology

## 2018-07-13 NOTE — Telephone Encounter (Signed)
Per 12/18 no los °

## 2019-04-25 DIAGNOSIS — M810 Age-related osteoporosis without current pathological fracture: Secondary | ICD-10-CM | POA: Insufficient documentation

## 2019-04-30 DIAGNOSIS — K219 Gastro-esophageal reflux disease without esophagitis: Secondary | ICD-10-CM | POA: Insufficient documentation

## 2019-04-30 DIAGNOSIS — C50919 Malignant neoplasm of unspecified site of unspecified female breast: Secondary | ICD-10-CM | POA: Insufficient documentation

## 2019-07-27 HISTORY — PX: BREAST LUMPECTOMY: SHX2

## 2019-09-26 ENCOUNTER — Other Ambulatory Visit: Payer: Self-pay | Admitting: Endocrinology

## 2019-09-26 DIAGNOSIS — Z1231 Encounter for screening mammogram for malignant neoplasm of breast: Secondary | ICD-10-CM

## 2019-10-29 ENCOUNTER — Other Ambulatory Visit: Payer: Self-pay | Admitting: Endocrinology

## 2019-10-29 ENCOUNTER — Other Ambulatory Visit: Payer: Self-pay

## 2019-10-29 ENCOUNTER — Ambulatory Visit
Admission: RE | Admit: 2019-10-29 | Discharge: 2019-10-29 | Disposition: A | Discharge status: Home / Self Care | Payer: Medicare PPO | Source: Ambulatory Visit | Attending: Endocrinology | Admitting: Endocrinology

## 2019-10-29 DIAGNOSIS — Z1231 Encounter for screening mammogram for malignant neoplasm of breast: Secondary | ICD-10-CM

## 2019-10-29 DIAGNOSIS — N63 Unspecified lump in unspecified breast: Secondary | ICD-10-CM

## 2019-11-08 ENCOUNTER — Ambulatory Visit
Admission: RE | Admit: 2019-11-08 | Discharge: 2019-11-08 | Disposition: A | Discharge status: Home / Self Care | Payer: Medicare PPO | Source: Ambulatory Visit | Attending: Endocrinology | Admitting: Endocrinology

## 2019-11-08 ENCOUNTER — Other Ambulatory Visit: Payer: Self-pay | Admitting: Endocrinology

## 2019-11-08 ENCOUNTER — Other Ambulatory Visit: Payer: Self-pay

## 2019-11-08 DIAGNOSIS — N6310 Unspecified lump in the right breast, unspecified quadrant: Secondary | ICD-10-CM

## 2019-11-08 DIAGNOSIS — N63 Unspecified lump in unspecified breast: Secondary | ICD-10-CM

## 2019-11-13 ENCOUNTER — Ambulatory Visit
Admission: RE | Admit: 2019-11-13 | Discharge: 2019-11-13 | Disposition: A | Discharge status: Home / Self Care | Payer: Medicare PPO | Source: Ambulatory Visit | Attending: Endocrinology | Admitting: Endocrinology

## 2019-11-13 ENCOUNTER — Other Ambulatory Visit: Payer: Self-pay

## 2019-11-13 DIAGNOSIS — N6310 Unspecified lump in the right breast, unspecified quadrant: Secondary | ICD-10-CM

## 2019-11-15 DIAGNOSIS — C50919 Malignant neoplasm of unspecified site of unspecified female breast: Secondary | ICD-10-CM | POA: Insufficient documentation

## 2019-11-21 ENCOUNTER — Encounter: Payer: Self-pay | Admitting: Adult Health

## 2019-11-21 DIAGNOSIS — C50411 Malignant neoplasm of upper-outer quadrant of right female breast: Secondary | ICD-10-CM | POA: Insufficient documentation

## 2019-11-21 DIAGNOSIS — Z170 Estrogen receptor positive status [ER+]: Secondary | ICD-10-CM | POA: Insufficient documentation

## 2019-11-23 ENCOUNTER — Other Ambulatory Visit: Payer: Self-pay | Admitting: General Surgery

## 2019-11-23 ENCOUNTER — Telehealth: Payer: Self-pay | Admitting: Oncology

## 2019-11-23 NOTE — Telephone Encounter (Signed)
Scheduled appt per 4/30 sch message - pt is aware of appt date and time   

## 2019-11-25 NOTE — Progress Notes (Signed)
[place]  [phone] [phone]    D: Krista Butler  DOB: June 15, 1951  MR#: [id number]  FFM#:[id number]   Patient Care Team: Reynold Bowen, MD as PCP - General (Endocrinology) Chibuikem Thang, [name], MD as Consulting Physician (Oncology) Kyung Rudd, MD as Consulting Physician (Radiation Oncology) Rolm Bookbinder, MD as Consulting Physician (General Surgery) Molli Posey, MD as Consulting Physician (Obstetrics and Gynecology) OTHER MD:   CHIEF COMPLAINT: new estrogen receptor positive right breast cancer; history of noninvasive left breast cancer.  CURRENT THERAPY: Awaiting definitive surgery   INTERVAL HISTORY: Krista Butler returns today for evaluation of her newly diagnosed right breast cancer.  Recall she had "graduated" from follow up here for her left breast cancer in 06/2018.  Since her last visit, she underwent bilateral diagnostic mammography with tomography and right breast ultrasonography at [facility] on 11/08/2019 showing: breast density category D; suspicious 2.5 cm right breast mass at 10 o'clock, muscle involvement cannot be excluded; no evidence of left breast malignancy or lymphadenopathy.  She proceeded to biopsy on 11/13/2019 of the right breast area in question. Pathology from the procedure ([id number]) revealed: invasive mammary carcinoma, grade 2, e-cadherin negative. Prognostic indicators significant for: estrogen receptor 95% positive, progesterone receptor 95% positive, both with a strong staining intensity. Proliferation marker Ki67 of 20%. Her2 negative by immunohistochemistry (1+).  Her case was presented at the multidisciplinary breast cancer conference 11/21/2019.  At that time a preliminary plan was proposed: Breast MRI, then definitive surgery, systemic therapy, and radiation as appropriate  REVIEW OF SYSTEMS:   Krista Butler did well with the biopsy.  She met with Dr. Donne Hazel and he initiated discussion regarding surgery.   He also set her up for genetics testing which was done earlier today.  Arora aside from this concern feels and is doing just fine.  She exercises by walking.  She is taking a lot of supplements.  A detailed review of systems today was otherwise not on contributory   History of left breast cancer: From Dr Dana Allan earlier note:  #1Patient noted some thickening of the right breast. She did not have any symptoms in the left breast. The thickening did not show any discrete mass or distortion on the right. However the patient was found to have numerous bilateral calcifications. Within the left breast there was a new linear/branching calcifications measuring 12 mm. This was suspicious for malignancy. There are also 1.1 cm suspicious calcifications in a heterogeneous group within the upper inner quadrant of the right breast. She underwent stereotactic biopsy of both of these areas  #2The biopsy on the right was negative and only noted to be a fibroadenoma with associated coarse calcifications. 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  #3 patient had a MRI performed that showed a clot area of enhancement in the retroareolar region of the left breast measuring 1 cm in maximum does mention corresponding to the known DCIS.  #4 patient is now status post left-sided lumpectomy on 11/23/2012. Her pathology revealed DCIS grade 3. Margins were negative with the closest margin being 1 mm from the inferior posterior margins. Tumor was ER ER positive PR positive.  #5 status post completion of radiation therapy to the left breast on 02/08/2013. #6 patient will proceed with after her radiation with chemoprevention with tamoxifen 20 mg daily. But this will begin after she completes radiation therapy.  #7 NSABP B. 43 clinical study enrollment. He did  meet with the nurse and her tissue will be sent for HER-2 testing. HER-2 negative and  therefore did not enroll on B. 43 trial  #8 patient began adjuvant curative intent tamoxifen 20 mg daily starting 02/26/2013. Total of 5 years of therapy is planned.   MEDICAL HISTORY: Past Medical History:  Diagnosis Date   Allergy    medicated for this   Arm fracture    LEFT arm; as a child   Breast cancer ([place])    left   Diverticulosis    Family history of breast cancer    Family history of kidney cancer    GERD (gastroesophageal reflux disease)    History of radiation therapy 12/25/12-02/08/13   64.4 Gy to left breast   Osteoporosis    Personal history of radiation therapy    Thyroid disease    Wears glasses     SURGICAL HISTORY:  Past Surgical History:  Procedure Laterality Date   BREAST BIOPSY  1997   lt breast cyst   BREAST LUMPECTOMY Left 2014   BREAST LUMPECTOMY WITH NEEDLE LOCALIZATION Left 11/23/2012   Procedure: BREAST LUMPECTOMY WITH NEEDLE LOCALIZATION;  Surgeon: Rolm Bookbinder, MD;  Location: [facility];  Service: General;  Laterality: Left;   COLONOSCOPY     TONSILLECTOMY     UPPER GI ENDOSCOPY  2012    FAMILY HISTORY: Family History  Problem Relation Age of Onset   Heart disease Mother    Stroke Father    Stroke Maternal Grandmother    Stroke Maternal Grandfather    Heart Problems Paternal Grandfather    Breast cancer Cousin        dx. younger than 64 (paternal cousin)   Cancer Cousin        dx. in her 22s, unknown type (paternal cousin)   Kidney cancer Cousin 52       (maternal cousin)   Other Cousin        genetic testing - possible CHEK2 mutation (maternal cousin)   Colon cancer Neg Hx    Esophageal cancer Neg Hx    Pancreatic cancer Neg Hx    Stomach cancer Neg Hx    Liver disease Neg Hx    The patient's father died at the age of 58 following a stroke in the setting of severe heart disease. The patient's mother died also from heart disease at the age of 49. The patient had no brothers, one  sister. There is no history of breast or ovarian cancer in the family.   GYN HISTORY: Menarche age 19, first live birth age 37. The patient is GX P2. She went through menopause in her early 52s. She took hormone replacement briefly (approximately 2 years).   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 Her husband of 40+ years, Zenia Resides, used to work for [employer] but is now a Chief Strategy Officer. Daughter Geroge Baseman is a homemaker in [place]. Son Lovey Newcomer is a tool and dye Mining engineer. The patient has 5 grandchildren. She has one great grandchild. She attends the home church in [place].   ADVANCED DIRECTIVES: Not in place   HEALTH MAINTENANCE: Social History   Tobacco Use   Smoking status: Never Smoker   Smokeless tobacco: Never Used  Substance Use Topics   Alcohol use: Yes    Alcohol/week: 2.0 standard drinks    Types: 2 Glasses of wine per week    Comment: occ   Drug use: No  Colonoscopy  Pap smear  DEXA scan  Lipid panel  Current Outpatient Medications  Medication Sig Dispense Refill   Biotin 1000 MCG tablet Take 1,000 mcg by mouth daily.     Calcium Carb-Cholecalciferol (CALCIUM 1000 + D) 1000-800 MG-UNIT TABS Take 1 tablet by mouth daily.     levothyroxine (SYNTHROID, LEVOTHROID) 175 MCG tablet Take 175 mcg by mouth daily before breakfast.     omeprazole (PRILOSEC) 20 MG capsule TAKE 1 CAPSULE BY MOUTH ONCE DAILY 90 capsule 1   Probiotic Product (PROBIOTIC DAILY) CAPS Take 1 capsule by mouth daily.     tamoxifen (NOLVADEX) 20 MG tablet TAKE ONE TABLET BY MOUTH ONCE DAILY 90 tablet 4   zinc gluconate 50 MG tablet Take 50 mg by mouth daily. 30 mg     No current facility-administered medications for this visit.    PHYSICAL EXAMINATION:  white woman in no acute distress Blood pressure 114/60, pulse 71, temperature 97.6 F (36.4 C), temperature source Temporal, resp. rate 18, height 5' 6.25" (1.683 m), weight 148 lb 4.8  oz (67.3 kg), SpO2 99 %. Body mass index is 23.76 kg/m.   ECOG PERFORMANCE STATUS: 1 - Symptomatic but completely ambulatory  Sclerae unicteric, EOMs intact Wearing a mask No cervical or supraclavicular adenopathy Lungs no rales or rhonchi Heart regular rate and rhythm Abd soft, nontender, positive bowel sounds MSK no focal spinal tenderness, no upper extremity lymphedema Neuro: nonfocal, well oriented, appropriate affect Breasts: I do not palpate a mass in the right breast.  There are no skin or nipple changes of concern.  The right axilla is benign.  The left breast is status post lumpectomy and radiation.  There is no evidence of local recurrence.   LABORATORY DATA: Lab Results  Component Value Date   WBC 4.8 11/26/2019   HGB 14.4 11/26/2019   HCT 43.9 11/26/2019   MCV 94.0 11/26/2019   PLT 251 11/26/2019      Chemistry      Component Value Date/Time   NA 142 11/26/2019 1159   NA 142 06/13/2013 1317   K 4.4 11/26/2019 1159   K 4.0 06/13/2013 1317   CL 103 11/26/2019 1159   CL 102 11/15/2012 1224   CO2 28 11/26/2019 1159   CO2 27 06/13/2013 1317   BUN 12 11/26/2019 1159   BUN 13.0 06/13/2013 1317   CREATININE 0.73 11/26/2019 1159   CREATININE 0.7 06/13/2013 1317      Component Value Date/Time   CALCIUM 9.6 11/26/2019 1159   CALCIUM 10.1 06/13/2013 1317   ALKPHOS 114 11/26/2019 1159   ALKPHOS 81 06/13/2013 1317   AST 27 11/26/2019 1159   AST 22 06/13/2013 1317   ALT 30 11/26/2019 1159   ALT 16 06/13/2013 1317   BILITOT 0.6 11/26/2019 1159   BILITOT 0.39 06/13/2013 1317       RESULTS: US BREAST LTD UNI RIGHT INC AXILLA  Result Date: 11/08/2019 CLINICAL DATA:  69 year old female presenting for evaluation of a palpable lump in her right breast. She has history of left breast cancer in 2014 status post lumpectomy and radiation therapy. EXAM: DIGITAL DIAGNOSTIC BILATERAL MAMMOGRAM WITH CAD AND TOMO ULTRASOUND RIGHT BREAST COMPARISON:  Previous exam(s). ACR  Breast Density Category d: The breast tissue is extremely dense, which lowers the sensitivity of mammography. FINDINGS: A BB has been placed on the upper-outer quadrant of the right breast indicating the palpable area of concern. Deep to this palpable marker, there is an area of distortion with a  possible obscured mass. The left breast lumpectomy site appears stable. No other suspicious calcifications, masses or areas of distortion are seen in the bilateral breasts. Mammographic images were processed with CAD. Ultrasound targeted to the palpable site in the right breast at 10 o'clock, 5 cm from the nipple demonstrates an irregular hypoechoic mass measuring 2.5 x 1.6 x 2.4 cm. The mass abuts the underlying musculature, raising the possibility of muscle involvement. On the spot compression tomosynthesis mammogram images, there does appear to be a preserved fat plane between the mass and the underlying muscle, however the inferior posterior margin was not included in the field of view due to the far posterior positioning. Ultrasound of the right axilla demonstrates multiple normal-appearing lymph nodes. IMPRESSION: 1. There is a highly suspicious mass in the right breast at 10 o'clock. The muscle abuts the underlying musculature on ultrasound. Muscle involvement cannot be excluded. 2.  No evidence of right axillary lymphadenopathy. 3. Stable left breast lumpectomy site. No mammographic evidence of malignancy in the left breast. RECOMMENDATION: 1. Ultrasound-guided biopsy is recommended for the right breast mass at 10 o'clock. This has been scheduled for 11/13/2019 at 8:30 a.m. 2. Following biopsy, breast MRI with and without contrast is recommended to exclude chest wall involvement, and for extent of disease given the extreme density of the patient's breast tissue, and screening of the contralateral breast given the patient's prior history of breast cancer in 2014. I have discussed the findings and recommendations with  the patient. If applicable, a reminder letter will be sent to the patient regarding the next appointment. BI-RADS CATEGORY  5: Highly suggestive of malignancy. Electronically Signed   By: Ammie Ferrier M.D.   On: 11/08/2019 16:21   MM DIAG BREAST TOMO BILATERAL  Result Date: 11/08/2019 CLINICAL DATA:  69 year old female presenting for evaluation of a palpable lump in her right breast. She has history of left breast cancer in 2014 status post lumpectomy and radiation therapy. EXAM: DIGITAL DIAGNOSTIC BILATERAL MAMMOGRAM WITH CAD AND TOMO ULTRASOUND RIGHT BREAST COMPARISON:  Previous exam(s). ACR Breast Density Category d: The breast tissue is extremely dense, which lowers the sensitivity of mammography. FINDINGS: A BB has been placed on the upper-outer quadrant of the right breast indicating the palpable area of concern. Deep to this palpable marker, there is an area of distortion with a possible obscured mass. The left breast lumpectomy site appears stable. No other suspicious calcifications, masses or areas of distortion are seen in the bilateral breasts. Mammographic images were processed with CAD. Ultrasound targeted to the palpable site in the right breast at 10 o'clock, 5 cm from the nipple demonstrates an irregular hypoechoic mass measuring 2.5 x 1.6 x 2.4 cm. The mass abuts the underlying musculature, raising the possibility of muscle involvement. On the spot compression tomosynthesis mammogram images, there does appear to be a preserved fat plane between the mass and the underlying muscle, however the inferior posterior margin was not included in the field of view due to the far posterior positioning. Ultrasound of the right axilla demonstrates multiple normal-appearing lymph nodes. IMPRESSION: 1. There is a highly suspicious mass in the right breast at 10 o'clock. The muscle abuts the underlying musculature on ultrasound. Muscle involvement cannot be excluded. 2.  No evidence of right axillary  lymphadenopathy. 3. Stable left breast lumpectomy site. No mammographic evidence of malignancy in the left breast. RECOMMENDATION: 1. Ultrasound-guided biopsy is recommended for the right breast mass at 10 o'clock. This has been scheduled for 11/13/2019 at 8:30  a.m. 2. Following biopsy, breast MRI with and without contrast is recommended to exclude chest wall involvement, and for extent of disease given the extreme density of the patient's breast tissue, and screening of the contralateral breast given the patient's prior history of breast cancer in 2014. I have discussed the findings and recommendations with the patient. If applicable, a reminder letter will be sent to the patient regarding the next appointment. BI-RADS CATEGORY  5: Highly suggestive of malignancy. Electronically Signed   By: Ammie Ferrier M.D.   On: 11/08/2019 16:21   MM CLIP PLACEMENT RIGHT  Result Date: 11/13/2019 CLINICAL DATA:  69 year old female with history of left breast cancer in 2014 status post lumpectomy and radiation presenting for biopsy of a right breast mass. EXAM: DIAGNOSTIC RIGHT MAMMOGRAM POST ULTRASOUND BIOPSY COMPARISON:  Previous exam(s). FINDINGS: Mammographic images were obtained following ultrasound guided biopsy of a right breast mass at 10 o'clock. The ribbon biopsy marking clip is in expected position at the site of biopsy. IMPRESSION: Appropriate positioning of the ribbon shaped biopsy marking clip at the site of biopsy in the right breast at 10 o'clock. Final Assessment: Post Procedure Mammograms for Marker Placement Electronically Signed   By: Audie Pinto M.D.   On: 11/13/2019 09:26   [place] RT BREAST BX W LOC DEV 1ST LESION IMG BX SPEC US GUIDE  Addendum Date: 11/15/2019   ADDENDUM REPORT: 11/15/2019 08:55 ADDENDUM: Pathology revealed GRADE II INVASIVE MAMMARY CARCINOMA of the RIGHT breast, 10:00.E-cadherin is NEGATIVE supporting lobular origin. This was found to be concordant by Dr. Audie Pinto.  Pathology results were discussed with the patient by telephone. The patient reported doing well after the biopsy with tenderness at the site. Post biopsy instructions and care were reviewed and questions were answered. The patient was encouraged to call [facility] for any additional concerns. Breast MRI recommended prior to surgical management. Surgical consultation has been arranged with Dr. Rolm Bookbinder at [facility] on November 16, 2019. Pathology results reported by Stacie Acres RN on 11/15/2019. Electronically Signed   By: Audie Pinto M.D.   On: 11/15/2019 08:55   Result Date: 11/15/2019 CLINICAL DATA:  69 year old female presenting for biopsy of a right breast mass. History of left breast cancer in 2014 status post lumpectomy and radiation. EXAM: ULTRASOUND GUIDED RIGHT BREAST CORE NEEDLE BIOPSY COMPARISON:  Previous exam(s). PROCEDURE: I met with the patient and we discussed the procedure of ultrasound-guided biopsy, including benefits and alternatives. We discussed the high likelihood of a successful procedure. We discussed the risks of the procedure, including infection, bleeding, tissue injury, clip migration, and inadequate sampling. Informed written consent was given. The usual time-out protocol was performed immediately prior to the procedure. Lesion quadrant: Upper outer quadrant Using sterile technique and 1% Lidocaine as local anesthetic, under direct ultrasound visualization, a 14 gauge spring-loaded device was used to perform biopsy of a right breast mass at 10 o'clock using a lateral approach. At the conclusion of the procedure a ribbon tissue marker clip was deployed into the biopsy cavity. Follow up 2 view mammogram was performed and dictated separately. IMPRESSION: Ultrasound guided biopsy of a right breast mass at 10 o'clock. No apparent complications. Electronically Signed: By: Audie Pinto M.D. On: 11/13/2019 09:27     ASSESSMENT:   69 y.o. [place], [place] woman:  LEFT BREAST CANCER: (1) status post left lumpectomy 11/23/2012 for ductal carcinoma in situ, grade 3, estrogen receptor 100% positive, progesterone receptor 0% positive, with -4  very close margins  (2) considered  NSABP B-43  but was found to be HER-2 negative ([id number])  (3) completed adjuvant radiation therapy on 02/08/2013.  (4) tamoxifen 20 mg daily started August 2014, discontinued August 2019  RIGHT BREAST CANCER: (5) status post right breast upper outer quadrant biopsy 11/13/2019 for a clinical T2 N0, stage IB invasive lobular carcinoma, E-cadherin negative, estrogen and progesterone receptor positive, with an MIB-1 of 20% and no HER-2 amplification  (6) genetics testing  (7) osteoporosis  (8) Oncotype testing on the 11/13/2019 biopsy requested 11/26/2019  (9) definitive surgery to follow  (10) adjuvant radiation as appropriate  (11) anastrozole to start at the completion of local treatment  (a) to start denosumab/Prolia at the start of anastrozole     PLAN:  Yatziri is 7 years out from her noninvasive left-sided breast cancer with no evidence of disease recurrence.  That cancer is likely cured.  She now has a new right-sided breast cancer which is invasive and lobular.  She understands this is a completely different cancer and not a recurrence.  We discussed the fact that lobular breast cancers health do not stick together but scatter more the way dry rise scattered see if you throat on the floor.  They are very hard to image by mammogram.  That is why she is having an MRI hopefully within the next week or so.  With the information we have today however this appears to be a stage Ib invasive lobular breast cancer.  She understands that lumpectomy plus radiation is as good as mastectomy as far as survival is concerned, but there are also cosmetic issues and we will not know until Dr. Donne Hazel reviews the MRI whether a good enough cosmesis can be  obtained with lumpectomy.  Similarly if she does carry a deleterious mutation she might consider bilateral mastectomies but intensified screening would be an equally good option in terms of survival  I am going to request an Oncotype from the original biopsy but I have yet to have a lobular breast cancer that was high risk on Oncotype.  Accordingly my expectation is most likely she will not need chemotherapy  Tentatively I am making her a return appointment here in about 4weeks just to make sure that we stay in touch, but we may need to change that depending on what happens with her surgery  Total encounter time 35 minutes.*  Javia Dillow, [name], MD  11/26/19 12:59 PM Medical Oncology and Hematology [facility] [place], [place] [address] Tel. [phone]    Fax. [phone]   I, Wilburn Mylar, am acting as scribe for Dr. [name]. Maebelle Sulton.  I, Lurline Del MD, have reviewed the above documentation for accuracy and completeness, and I agree with the above.    *Total Encounter Time as defined by the Centers for Medicare and Medicaid Services includes, in addition to the face-to-face time of a patient visit (documented in the note above) non-face-to-face time: obtaining and reviewing outside history, ordering and reviewing medications, tests or procedures, care coordination (communications with other health care professionals or caregivers) and documentation in the medical record.

## 2019-11-26 ENCOUNTER — Other Ambulatory Visit: Payer: Self-pay | Admitting: Genetic Counselor

## 2019-11-26 ENCOUNTER — Encounter: Payer: Self-pay | Admitting: Genetic Counselor

## 2019-11-26 ENCOUNTER — Inpatient Hospital Stay (HOSPITAL_BASED_OUTPATIENT_CLINIC_OR_DEPARTMENT_OTHER): Payer: Medicare PPO | Admitting: Genetic Counselor

## 2019-11-26 ENCOUNTER — Other Ambulatory Visit: Payer: Self-pay | Admitting: General Surgery

## 2019-11-26 ENCOUNTER — Inpatient Hospital Stay: Payer: Medicare PPO | Attending: Oncology | Admitting: Oncology

## 2019-11-26 ENCOUNTER — Other Ambulatory Visit (HOSPITAL_COMMUNITY): Payer: Self-pay | Admitting: General Surgery

## 2019-11-26 ENCOUNTER — Inpatient Hospital Stay: Payer: Medicare PPO

## 2019-11-26 ENCOUNTER — Other Ambulatory Visit: Payer: Self-pay

## 2019-11-26 VITALS — BP 114/60 | HR 71 | Temp 97.6°F | Resp 18 | Ht 66.25 in | Wt 148.3 lb

## 2019-11-26 DIAGNOSIS — Z803 Family history of malignant neoplasm of breast: Secondary | ICD-10-CM | POA: Insufficient documentation

## 2019-11-26 DIAGNOSIS — C50411 Malignant neoplasm of upper-outer quadrant of right female breast: Secondary | ICD-10-CM | POA: Diagnosis present

## 2019-11-26 DIAGNOSIS — M818 Other osteoporosis without current pathological fracture: Secondary | ICD-10-CM | POA: Insufficient documentation

## 2019-11-26 DIAGNOSIS — C50212 Malignant neoplasm of upper-inner quadrant of left female breast: Secondary | ICD-10-CM

## 2019-11-26 DIAGNOSIS — Z170 Estrogen receptor positive status [ER+]: Secondary | ICD-10-CM

## 2019-11-26 DIAGNOSIS — Z8051 Family history of malignant neoplasm of kidney: Secondary | ICD-10-CM

## 2019-11-26 LAB — CBC WITH DIFFERENTIAL/PLATELET
Abs Immature Granulocytes: 0 10*3/uL (ref 0.00–0.07)
Basophils Absolute: 0 10*3/uL (ref 0.0–0.1)
Basophils Relative: 1 %
Eosinophils Absolute: 0.1 10*3/uL (ref 0.0–0.5)
Eosinophils Relative: 2 %
HCT: 43.9 % (ref 36.0–46.0)
Hemoglobin: 14.4 g/dL (ref 12.0–15.0)
Immature Granulocytes: 0 %
Lymphocytes Relative: 39 %
Lymphs Abs: 1.9 10*3/uL (ref 0.7–4.0)
MCH: 30.8 pg (ref 26.0–34.0)
MCHC: 32.8 g/dL (ref 30.0–36.0)
MCV: 94 fL (ref 80.0–100.0)
Monocytes Absolute: 0.4 10*3/uL (ref 0.1–1.0)
Monocytes Relative: 9 %
Neutro Abs: 2.4 10*3/uL (ref 1.7–7.7)
Neutrophils Relative %: 49 %
Platelets: 251 10*3/uL (ref 150–400)
RBC: 4.67 MIL/uL (ref 3.87–5.11)
RDW: 12.2 % (ref 11.5–15.5)
WBC: 4.8 10*3/uL (ref 4.0–10.5)
nRBC: 0 % (ref 0.0–0.2)

## 2019-11-26 LAB — COMPREHENSIVE METABOLIC PANEL
ALT: 30 U/L (ref 0–44)
AST: 27 U/L (ref 15–41)
Albumin: 4.2 g/dL (ref 3.5–5.0)
Alkaline Phosphatase: 114 U/L (ref 38–126)
Anion gap: 11 (ref 5–15)
BUN: 12 mg/dL (ref 8–23)
CO2: 28 mmol/L (ref 22–32)
Calcium: 9.6 mg/dL (ref 8.9–10.3)
Chloride: 103 mmol/L (ref 98–111)
Creatinine, Ser: 0.73 mg/dL (ref 0.44–1.00)
GFR calc Af Amer: >60 mL/min (ref >60)
GFR calc non Af Amer: >60 mL/min (ref >60)
Glucose, Bld: 91 mg/dL (ref 70–99)
Potassium: 4.4 mmol/L (ref 3.5–5.1)
Sodium: 142 mmol/L (ref 135–145)
Total Bilirubin: 0.6 mg/dL (ref 0.3–1.2)
Total Protein: 7.9 g/dL (ref 6.5–8.1)

## 2019-11-26 NOTE — Progress Notes (Signed)
REFERRING PROVIDER: Rolm Bookbinder, MD [facility] [place],  [place] [address]  PRIMARY PROVIDER:  Reynold Bowen, MD  PRIMARY REASON FOR VISIT:  1. Malignant neoplasm of upper-inner quadrant of left breast in female, estrogen receptor positive ([place])   2. Malignant neoplasm of upper-outer quadrant of right breast in female, estrogen receptor positive ([place])   3. Family history of breast cancer   4. Family history of kidney cancer      HISTORY OF PRESENT ILLNESS:   Ms. Jakes, a 69 y.o. female, was seen for a [facility] cancer genetics consultation at the request of Dr. Donne Hazel due to a personal history of bilateral breast cancer.  Ms. Kulinski presents to clinic today to discuss the possibility of a hereditary predisposition to cancer, genetic testing, and to further clarify her future cancer risks, as well as potential cancer risks for family members.   In 2014, at the age of 14, Ms. Saltzman was diagnosed with ductal carcinoma in situ, ER+/PR-, of the left breast. The treatment plan included lumpectomy, radiation therapy, and antiestrogen therapy with tamoxifen.   In February 2021, at the age of 27, Ms. 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  In April 2021, at the age of 27, Ms. Wesche was diagnosed with invasive mammary carcinoma, ER+/PR+/Her2-, of the right breast.   RISK FACTORS:  Menarche was at age 13.  First live birth at age 61.  OCP use for approximately 1 years.  Ovaries intact: yes.  Hysterectomy: no.  Menopausal status: postmenopausal.  HRT use: 2 years. Colonoscopy: yes; 04/19/2017 - one polyp. Mammogram within the last year: yes. Any excessive radiation exposure in the past: radiation therapy of left breast in 2014.   Past Medical History:  Diagnosis Date  . Allergy    medicated for this  . Arm fracture    LEFT arm; as a child  . Breast cancer ([place])    left  . Diverticulosis   . Family history of breast cancer   . Family  history of kidney cancer   . GERD (gastroesophageal reflux disease)   . History of radiation therapy 12/25/12-02/08/13   64.4 Gy to left breast  . Osteoporosis   . Personal history of radiation therapy   . Thyroid disease   . Wears glasses     Past Surgical History:  Procedure Laterality Date  . BREAST BIOPSY  1997   lt breast cyst  . BREAST LUMPECTOMY Left 2014  . BREAST LUMPECTOMY WITH NEEDLE LOCALIZATION Left 11/23/2012   Procedure: BREAST LUMPECTOMY WITH NEEDLE LOCALIZATION;  Surgeon: Rolm Bookbinder, MD;  Location: [facility];  Service: General;  Laterality: Left;  . COLONOSCOPY    . TONSILLECTOMY    . UPPER GI ENDOSCOPY  2012    Social History   Socioeconomic History  . Marital status: Married    Spouse name: Not on file  . Number of children: 2  . Years of education: Not on file  . Highest education level: Not on file  Occupational History  . Occupation: Homemaker  Tobacco Use  . Smoking status: Never Smoker  . Smokeless tobacco: Never Used  Substance and Sexual Activity  . Alcohol use: Yes    Alcohol/week: 2.0 standard drinks    Types: 2 Glasses of wine per week    Comment: occ  . Drug use: No  . Sexual activity: Not Currently  Other Topics Concern  . Not on file  Social History Narrative  .  Not on file   Social Determinants of Health   Financial Resource Strain:   . Difficulty of Paying Living Expenses:   Food Insecurity:   . Worried About Charity fundraiser in the Last Year:   . Arboriculturist in the Last Year:   Transportation Needs:   . Film/video editor (Medical):   Marland Kitchen Lack of Transportation (Non-Medical):   Physical Activity:   . Days of Exercise per Week:   . Minutes of Exercise per Session:   Stress:   . Feeling of Stress :   Social Connections:   . Frequency of Communication with Friends and Family:   . Frequency of Social Gatherings with Friends and Family:   . Attends Religious Services:   . Active Member of  Clubs or Organizations:   . Attends Archivist Meetings:   Marland Kitchen Marital Status:      FAMILY HISTORY:  We obtained a detailed, 4-generation family history.  Significant diagnoses are listed below: Family History  Problem Relation Age of Onset  . Heart disease Mother   . Stroke Father   . Stroke Maternal Grandmother   . Stroke Maternal Grandfather   . Heart Problems Paternal Grandfather   . Breast cancer Cousin        dx. younger than 79 (paternal cousin)  . Cancer Cousin        dx. in her 96s, unknown type (paternal cousin)  . Kidney cancer Cousin 43       (maternal cousin)  . Other Cousin        genetic testing - possible CHEK2 mutation (maternal cousin)  . Colon cancer Neg Hx   . Esophageal cancer Neg Hx   . Pancreatic cancer Neg Hx   . Stomach cancer Neg Hx   . Liver disease Neg Hx    Ms. Desrocher has one son, Dian Situ (age 41), and one daughter, Threasa Beards (age 50). She has one sister (age 62). None of these family members have had cancer.  Ms. Jaskulski mother died at the age of 16 from heart disease and did not have cancer. Ms. Occhipinti had one maternal uncle and eight maternal aunts. None of her aunts or uncle had cancer, and all died when they were older than 53 except one aunt who died when she was 60 from heart problems. Ms. Kemmerer has a maternal first cousin who died at the age of 30 from kidney cancer. Another maternal cousin has not had cancer, but had genetic testing which may have shown a mutation in the CHEK2 gene, although Ms. Noori is not sure. Another one of her cousins had a son who died from cancer at the age of 50, although she is not sure what type of cancer this was. Her maternal grandparents both died in their 73s from strokes.  Ms. Eads father died at the age of 65 form a stroke and heart disease, and did not have cancer. She had 13 paternal aunts and uncles, some of whom were half-siblings to her father. All of her paternal aunts and uncles died when they  were older than 76, and none had cancer. One of her paternal first cousins died from breast cancer when she was younger than 76. Another of her cousins (through one of her father's half-siblings) died from cancer in her 21s, although Ms. Scurlock is not sure what type of cancer. Her paternal grandfather died when he was older than 91 from heart problems, and her paternal grandmother died  when she was younger than 36 in the 1918 flu pandemic.   Ms. Selmer is aware of genetic testing for hereditary cancer risks in her maternal cousin which may have been positive for a mutation in the CHEK2 gene, although this is unconfirmed. Her ancestors are of [place], [place], Native American, and [place] descent. There is no reported Ashkenazi Jewish ancestry. There is no known consanguinity.  GENETIC COUNSELING ASSESSMENT: Ms. Dresner is a 69 y.o. female with a personal history of bilateral breast cancer and a family history of breast cancer, which is somewhat suggestive of a hereditary cancer syndrome and predisposition to cancer. We, therefore, discussed and recommended the following at today's visit.   DISCUSSION: We discussed that 5 - 10% of breast cancer is hereditary, with most cases associated with the BRCA1 and BRCA2 genes.  There are other genes that can be associated with hereditary breast cancer syndromes.  These include ATM, CHEK2, PALB2, PTEN, etc.  We discussed that testing is beneficial for several reasons, including knowing about other cancer risks, identifying potential screening and risk-reduction options that may be appropriate, and to understand if other family members could be at risk for cancer and allow them to undergo genetic testing.  We reviewed the characteristics, features and inheritance patterns of hereditary cancer syndromes. We also discussed genetic testing, including the appropriate family members to test, the process of testing, insurance coverage and turn-around-time for results. We  discussed the implications of a negative, positive and/or variant of uncertain significant result. In order to get genetic test results in a timely manner so that Ms. Siers can use these genetic test results for surgical decisions, we recommended Ms. Releford pursue genetic testing for the [facility] Breast Cancer STAT panel. Once complete, we recommend Ms. Siragusa pursue reflex genetic testing to the Multi-Cancer panel.   The Breast Cancer STAT Panel offered by [facility] includes sequencing and deletion/duplication analysis for the following 9 genes:  ATM, BRCA1, BRCA2, CDH1, CHEK2, PALB2, PTEN, STK11 and TP53. The Multi-Cancer Panel offered by [facility] includes sequencing and/or deletion duplication testing of the following 85 genes: AIP, ALK, APC, ATM, AXIN2,BAP1,  BARD1, BLM, BMPR1A, BRCA1, BRCA2, BRIP1, CASR, CDC73, CDH1, CDK4, CDKN1B, CDKN1C, CDKN2A (p14ARF), CDKN2A (p16INK4a), CEBPA, CHEK2, CTNNA1, DICER1, DIS3L2, EGFR (c.2369C>T, p.Thr790Met variant only), EPCAM (Deletion/duplication testing only), FH, FLCN, GATA2, GPC3, GREM1 (Promoter region deletion/duplication testing only), HOXB13 (c.251G>A, p.Gly84Glu), HRAS, KIT, MAX, MEN1, MET, MITF (c.952G>A, p.Glu318Lys variant only), MLH1, MSH2, MSH3, MSH6, MUTYH, NBN, NF1, NF2, NTHL1, PALB2, PDGFRA, PHOX2B, PMS2, POLD1, POLE, POT1, PRKAR1A, PTCH1, PTEN, RAD50, RAD51C, RAD51D, RB1, RECQL4, RET, RNF43, RUNX1, SDHAF2, SDHA (sequence changes only), SDHB, SDHC, SDHD, SMAD4, SMARCA4, SMARCB1, SMARCE1, STK11, SUFU, TERC, TERT, TMEM127, TP53, TSC1, TSC2, VHL, WRN and WT1.    Based on Ms. Mckendry's personal and family history of cancer, she meets medical criteria for genetic testing. Despite that she meets criteria, she may still have an out of pocket cost.   PLAN: After considering the risks, benefits, and limitations, Ms. Blyden provided informed consent to pursue genetic testing and the blood sample was sent to [facility] for analysis of the Breast Cancer STAT  Panel + Multi-Cancer Panel. Results should be available within approximately one-two weeks' time, at which point they will be disclosed by telephone to Ms. Mattila, as will any additional recommendations warranted by these results. Ms. Domek will receive a summary of her genetic counseling visit and a copy of her results once available. This information will also be available in Epic.  Lastly, we encouraged Ms. Hollibaugh to remain in contact with cancer genetics so that we can continuously update the family history and inform her of any changes in cancer genetics and testing that may be of benefit for this family.   Ms. Bittman questions were answered to her satisfaction today. Our contact information was provided should additional questions or concerns arise. Thank you for the referral and allowing [place] to share in the care of your patient.   Clint Guy, [place], [facility] Licensed, Certified Dispensing [email or web address] Phone: [phone]  The patient was seen for a total of 50 minutes in face-to-face genetic counseling.  This patient was discussed with Drs. Magrinat, Lindi Adie and/or Burr Medico who agrees with the above.    _______________________________________________________________________ For Office Staff:  Number of people involved in session: 1 Was an Intern/ student involved with case: no

## 2019-11-27 ENCOUNTER — Other Ambulatory Visit: Payer: Self-pay | Admitting: *Deleted

## 2019-11-27 ENCOUNTER — Telehealth: Payer: Self-pay | Admitting: *Deleted

## 2019-11-27 LAB — GENETIC SCREENING ORDER

## 2019-11-27 NOTE — Telephone Encounter (Signed)
Ordered oncotype (core) per Dr. Magrinat. Faxed requisition to [facility]. 

## 2019-11-28 ENCOUNTER — Telehealth: Payer: Self-pay | Admitting: Oncology

## 2019-11-28 NOTE — Telephone Encounter (Signed)
Scheduled appts per 5/3 los. Pt confirmed appt date and time.

## 2019-12-04 ENCOUNTER — Other Ambulatory Visit: Payer: Self-pay

## 2019-12-04 ENCOUNTER — Ambulatory Visit (HOSPITAL_COMMUNITY)
Admission: RE | Admit: 2019-12-04 | Discharge: 2019-12-04 | Disposition: A | Discharge status: Home / Self Care | Payer: Medicare PPO | Source: Ambulatory Visit | Attending: General Surgery | Admitting: General Surgery

## 2019-12-04 DIAGNOSIS — C50212 Malignant neoplasm of upper-inner quadrant of left female breast: Secondary | ICD-10-CM | POA: Diagnosis present

## 2019-12-04 DIAGNOSIS — C50411 Malignant neoplasm of upper-outer quadrant of right female breast: Secondary | ICD-10-CM | POA: Insufficient documentation

## 2019-12-04 DIAGNOSIS — Z170 Estrogen receptor positive status [ER+]: Secondary | ICD-10-CM

## 2019-12-04 MED ORDER — GADOBUTROL 1 MMOL/ML IV SOLN
6.0000 mL | Freq: Once | INTRAVENOUS | Status: AC | PRN
  Administered 2019-12-04: 6 mL via INTRAVENOUS

## 2019-12-05 ENCOUNTER — Telehealth: Payer: Self-pay | Admitting: Genetic Counselor

## 2019-12-05 ENCOUNTER — Encounter: Payer: Self-pay | Admitting: Genetic Counselor

## 2019-12-05 ENCOUNTER — Ambulatory Visit: Payer: Self-pay | Admitting: Genetic Counselor

## 2019-12-05 DIAGNOSIS — Z1379 Encounter for other screening for genetic and chromosomal anomalies: Secondary | ICD-10-CM

## 2019-12-05 NOTE — Telephone Encounter (Signed)
Revealed negative genetic testing. Discussed that we do not know why she has breast cancer or why there is cancer in the family. It is possible that there could be a mutation in a different gene that we are not testing, or our current technology may not be able detect certain mutations. It will therefore be important for Krista Butler to stay in contact with genetics to keep up with whether additional testing may be appropriate in the future.

## 2019-12-05 NOTE — Telephone Encounter (Signed)
LVM that her genetic test results are available and requested that she call back to discuss them.  

## 2019-12-06 ENCOUNTER — Other Ambulatory Visit: Payer: Self-pay | Admitting: General Surgery

## 2019-12-06 ENCOUNTER — Encounter: Payer: Self-pay | Admitting: Genetic Counselor

## 2019-12-06 DIAGNOSIS — C50411 Malignant neoplasm of upper-outer quadrant of right female breast: Secondary | ICD-10-CM

## 2019-12-06 NOTE — Progress Notes (Signed)
HPI:  Ms. Ingber was previously seen in the [facility] clinic due to a personal and family history of cancer and concerns regarding a hereditary predisposition to cancer. Please refer to our prior cancer genetics clinic note for more information regarding our discussion, assessment and recommendations, at the time. Ms. Hopson recent genetic test results were disclosed to her, as were recommendations warranted by these results. These results and recommendations are discussed in more detail below.  CANCER HISTORY:  Oncology History  Malignant neoplasm of upper-inner quadrant of left breast in female, estrogen receptor positive ([place])  11/09/2012 Initial Diagnosis   Malignant neoplasm of upper-inner quadrant of left breast in female, estrogen receptor positive ([place])   12/03/2019 Genetic Testing   Negative genetic testing:  No pathogenic variants detected on the [facility] Breast Cancer STAT + Multi-Cancer panels. The report date is 12/03/2019.  The Breast Cancer STAT Panel offered by [facility] includes sequencing and deletion/duplication analysis for the following 9 genes:  ATM, BRCA1, BRCA2, CDH1, CHEK2, PALB2, PTEN, STK11 and TP53. The Multi-Cancer Panel offered by [facility] includes sequencing and/or deletion duplication testing of the following 85 genes: AIP, ALK, APC, ATM, AXIN2,BAP1,  BARD1, BLM, BMPR1A, BRCA1, BRCA2, BRIP1, CASR, CDC73, CDH1, CDK4, CDKN1B, CDKN1C, CDKN2A (p14ARF), CDKN2A (p16INK4a), CEBPA, CHEK2, CTNNA1, DICER1, DIS3L2, EGFR (c.2369C>T, p.Thr790Met variant only), EPCAM (Deletion/duplication testing only), FH, FLCN, GATA2, GPC3, GREM1 (Promoter region deletion/duplication testing only), HOXB13 (c.251G>A, p.Gly84Glu), HRAS, KIT, MAX, MEN1, MET, MITF (c.952G>A, p.Glu318Lys variant only), MLH1, MSH2, MSH3, MSH6, MUTYH, NBN, NF1, NF2, NTHL1, PALB2, PDGFRA, PHOX2B, PMS2, POLD1, POLE, POT1, PRKAR1A, PTCH1, PTEN, RAD50, RAD51C, RAD51D, RB1, RECQL4, RET, RNF43, RUNX1, SDHAF2, SDHA  (sequence changes only), SDHB, SDHC, SDHD, SMAD4, SMARCA4, SMARCB1, SMARCE1, STK11, SUFU, TERC, TERT, TMEM127, TP53, TSC1, TSC2, VHL, WRN and WT1.     FAMILY HISTORY:  We obtained a detailed, 4-generation family history.  Significant diagnoses are listed below: Family History  Problem Relation Age of Onset  . Heart disease Mother   . Stroke Father   . Stroke Maternal Grandmother   . Stroke Maternal Grandfather   . Heart Problems Paternal Grandfather   . Breast cancer Cousin        dx. younger than 71 (paternal cousin)  . Cancer Cousin        dx. in her 21s, unknown type (paternal cousin)  . Kidney cancer Cousin 64       (maternal cousin)  . Other Cousin        genetic testing - unknown results (maternal cousin)  . Colon cancer Neg Hx   . Esophageal cancer Neg Hx   . Pancreatic cancer Neg Hx   . Stomach cancer Neg Hx   . Liver disease Neg Hx    Ms. Bosque has one son, Dian Situ (age 71), and one daughter, Threasa Beards (age 71). She has one sister (age 71). None of these family members have had cancer.  Ms. Sanagustin mother died at the age of 71 from heart disease and did not have cancer. Ms. Pedraza had one maternal uncle and eight maternal aunts. None of her aunts or uncle had cancer, and all died when they were older than 110 except one aunt who died when she was 70 from heart problems. Ms. Kiesel has a maternal first cousin who died at the age of 14 from kidney cancer. Another maternal cousin has not had cancer, but had genetic testing which may have shown a mutation in the CHEK2 gene, although Ms. Cellucci is not sure.  Another one of her cousins had a son who died from cancer at the age of 66, although she is not sure what type of cancer this was. Her maternal grandparents both died in their 61s from strokes.  Ms. Wauters father died at the age of 61 form a stroke and heart disease, and did not have cancer. She had 13 paternal aunts and uncles, some of whom were half-siblings to her father. All  of her paternal aunts and uncles died when they were older than 32, and none had cancer. One of her paternal first cousins died from breast cancer when she was younger than 3. Another of her cousins (through one of her father's half-siblings) died from cancer in her 63s, although Ms. Kosinski is not sure what type of cancer. Her paternal grandfather died when he was older than 76 from heart problems, and her paternal grandmother died when she was younger than 32 in the 1918 flu pandemic.   Ms. Brinley has a double-first cousin who died from leukemia at the age of 74. This cousin's mother was Ms. Kahl's maternal aunt, and the father was her paternal uncle.   Ms. Serafin is aware of genetic testing for hereditary cancer risks in her maternal cousin, although results are unknown. Her ancestors are of [place], [place], Native American, and [place] descent. There is no reported Ashkenazi Jewish ancestry. There is no known consanguinity.  GENETIC TEST RESULTS: Genetic testing reported out on 12/03/2019 through the [facility] Breast Cancer STAT panel + Multi-Cancer panel. No pathogenic variants were detected.   The Breast Cancer STAT Panel offered by [facility] includes sequencing and deletion/duplication analysis for the following 9 genes:  ATM, BRCA1, BRCA2, CDH1, CHEK2, PALB2, PTEN, STK11 and TP53. The Multi-Cancer Panel offered by [facility] includes sequencing and/or deletion duplication testing of the following 85 genes: AIP, ALK, APC, ATM, AXIN2,BAP1,  BARD1, BLM, BMPR1A, BRCA1, BRCA2, BRIP1, CASR, CDC73, CDH1, CDK4, CDKN1B, CDKN1C, CDKN2A (p14ARF), CDKN2A (p16INK4a), CEBPA, CHEK2, CTNNA1, DICER1, DIS3L2, EGFR (c.2369C>T, p.Thr790Met variant only), EPCAM (Deletion/duplication testing only), FH, FLCN, GATA2, GPC3, GREM1 (Promoter region deletion/duplication testing only), HOXB13 (c.251G>A, p.Gly84Glu), HRAS, KIT, MAX, MEN1, MET, MITF (c.952G>A, p.Glu318Lys variant only), MLH1, MSH2, MSH3, MSH6, MUTYH, NBN, NF1, NF2,  NTHL1, PALB2, PDGFRA, PHOX2B, PMS2, POLD1, POLE, POT1, PRKAR1A, PTCH1, PTEN, RAD50, RAD51C, RAD51D, RB1, RECQL4, RET, RNF43, RUNX1, SDHAF2, SDHA (sequence changes only), SDHB, SDHC, SDHD, SMAD4, SMARCA4, SMARCB1, SMARCE1, STK11, SUFU, TERC, TERT, TMEM127, TP53, TSC1, TSC2, VHL, WRN and WT1. The test report will be scanned into EPIC and located under the Molecular Pathology section of the Results Review tab.  A portion of the result report is included below for reference.     We discussed with Ms. Akerson that because current genetic testing is not perfect, it is possible there may be a gene mutation in one of these genes that current testing cannot detect, but that chance is small.  We also discussed, that there could be another gene that has not yet been discovered, or that we have not yet tested, that is responsible for the cancer diagnoses in the family. It is also possible there is a hereditary cause for the cancer in the family that Ms. Pyatt did not inherit and therefore was not identified in her testing.  Therefore, it is important to remain in touch with cancer genetics in the future so that we can continue to offer Ms. Daponte the most up to date genetic testing.   CANCER SCREENING RECOMMENDATIONS: Ms. Yohe test result is  considered negative (normal).  This means that we have not identified a hereditary cause for her personal and family history of cancer at this time. Most cancers happen by chance and this negative test suggests that her personal and family of cancer may fall into this category.    While reassuring, this does not definitively rule out a hereditary predisposition to cancer. It is still possible that there could be genetic mutations that are undetectable by current technology. There could be genetic mutations in genes that have not been tested or identified to increase cancer risk.  Therefore, it is recommended she continue to follow the cancer management and screening guidelines  provided by her oncology and primary healthcare providers.   An individual's cancer risk and medical management are not determined by genetic test results alone. Overall cancer risk assessment incorporates additional factors, including personal medical history, family history, and any available genetic information that may result in a personalized plan for cancer prevention and surveillance.  RECOMMENDATIONS FOR FAMILY MEMBERS:  Individuals in this family might be at some increased risk of developing cancer, over the general population risk, simply due to the family history of cancer.  We recommended women in this family have a yearly mammogram beginning at age 9, or 42 years younger than the earliest onset of cancer, an annual clinical breast exam, and perform monthly breast self-exams. Women in this family should also have a gynecological exam as recommended by their primary provider. All family members should have a colonoscopy by age 70-50.  FOLLOW-UP: Lastly, we discussed with Ms. Willow that cancer genetics is a rapidly advancing field and it is possible that new genetic tests will be appropriate for her and/or her family members in the future. We encouraged her to remain in contact with cancer genetics on an annual basis so we can update her personal and family histories and let her know of advances in cancer genetics that may benefit this family.   Our contact number was provided. Ms. Pitt questions were answered to her satisfaction, and she knows she is welcome to call us at anytime with additional questions or concerns.   Clint Guy, MS, [facility] Genetic Counselor [email or web address] Phone: [phone]

## 2019-12-07 ENCOUNTER — Other Ambulatory Visit: Payer: Self-pay | Admitting: General Surgery

## 2019-12-11 ENCOUNTER — Telehealth: Payer: Self-pay | Admitting: *Deleted

## 2019-12-11 NOTE — Telephone Encounter (Signed)
Received oncotype score of 22/8%. Physician team notified. Called pt with results and discussed chemotherapy not recommended and next step will be sx with Dr. Donne Hazel. Received verbal understanding.

## 2019-12-11 NOTE — Telephone Encounter (Signed)
Pt called to this RN to state she has a preplanned family vacation week of July 23-30th.  She is inquiring if the above dates ( which is already paid for ) will interfere with probable need for radiation post her breast surgery probably to be done the first week in June.  Per Dr Jana Hakim- he request review of above by the rad onc MD for recommendation.  This note will be sent to Dr Lisbeth Renshaw.

## 2019-12-12 ENCOUNTER — Other Ambulatory Visit: Payer: Self-pay | Admitting: General Surgery

## 2019-12-12 DIAGNOSIS — Z170 Estrogen receptor positive status [ER+]: Secondary | ICD-10-CM

## 2019-12-13 ENCOUNTER — Telehealth: Payer: Self-pay | Admitting: Oncology

## 2019-12-13 ENCOUNTER — Other Ambulatory Visit: Payer: Self-pay | Admitting: *Deleted

## 2019-12-13 DIAGNOSIS — Z170 Estrogen receptor positive status [ER+]: Secondary | ICD-10-CM

## 2019-12-13 NOTE — Progress Notes (Signed)
ambu

## 2019-12-13 NOTE — Telephone Encounter (Signed)
R/s appt per 5/20 sch message - pt is aware of appt date and time

## 2019-12-18 ENCOUNTER — Other Ambulatory Visit: Payer: Self-pay

## 2019-12-18 ENCOUNTER — Encounter (HOSPITAL_BASED_OUTPATIENT_CLINIC_OR_DEPARTMENT_OTHER): Payer: Self-pay | Admitting: General Surgery

## 2019-12-20 ENCOUNTER — Encounter: Payer: Self-pay | Admitting: *Deleted

## 2019-12-22 ENCOUNTER — Other Ambulatory Visit (HOSPITAL_COMMUNITY)
Admission: RE | Admit: 2019-12-22 | Discharge: 2019-12-22 | Disposition: A | Discharge status: Home / Self Care | Payer: Medicare PPO | Source: Ambulatory Visit | Attending: General Surgery | Admitting: General Surgery

## 2019-12-22 DIAGNOSIS — Z20822 Contact with and (suspected) exposure to covid-19: Secondary | ICD-10-CM | POA: Diagnosis not present

## 2019-12-22 DIAGNOSIS — Z01812 Encounter for preprocedural laboratory examination: Secondary | ICD-10-CM | POA: Insufficient documentation

## 2019-12-22 LAB — SARS CORONAVIRUS 2 (TAT 6-24 HRS): SARS Coronavirus 2: NEGATIVE

## 2019-12-25 ENCOUNTER — Other Ambulatory Visit: Payer: Self-pay | Admitting: General Surgery

## 2019-12-25 ENCOUNTER — Ambulatory Visit
Admission: RE | Admit: 2019-12-25 | Discharge: 2019-12-25 | Disposition: A | Discharge status: Home / Self Care | Payer: Medicare PPO | Source: Ambulatory Visit | Attending: General Surgery | Admitting: General Surgery

## 2019-12-25 ENCOUNTER — Other Ambulatory Visit: Payer: Self-pay

## 2019-12-25 DIAGNOSIS — Z170 Estrogen receptor positive status [ER+]: Secondary | ICD-10-CM

## 2019-12-25 NOTE — Telephone Encounter (Signed)
Thanks Val- we will talk with her about options when she gets back in with [place]!

## 2019-12-25 NOTE — Progress Notes (Signed)

## 2019-12-26 ENCOUNTER — Encounter (HOSPITAL_BASED_OUTPATIENT_CLINIC_OR_DEPARTMENT_OTHER): Payer: Self-pay | Admitting: General Surgery

## 2019-12-26 ENCOUNTER — Ambulatory Visit (HOSPITAL_BASED_OUTPATIENT_CLINIC_OR_DEPARTMENT_OTHER): Payer: Medicare PPO | Admitting: Anesthesiology

## 2019-12-26 ENCOUNTER — Ambulatory Visit (HOSPITAL_COMMUNITY)
Admission: RE | Admit: 2019-12-26 | Discharge: 2019-12-26 | Disposition: A | Discharge status: Home / Self Care | Payer: Medicare PPO | Source: Ambulatory Visit | Attending: General Surgery | Admitting: General Surgery

## 2019-12-26 ENCOUNTER — Ambulatory Visit (HOSPITAL_BASED_OUTPATIENT_CLINIC_OR_DEPARTMENT_OTHER)
Admission: RE | Admit: 2019-12-26 | Discharge: 2019-12-26 | Disposition: A | Discharge status: Home / Self Care | Payer: Medicare PPO | Attending: General Surgery | Admitting: General Surgery

## 2019-12-26 ENCOUNTER — Encounter (HOSPITAL_BASED_OUTPATIENT_CLINIC_OR_DEPARTMENT_OTHER)
Admission: RE | Disposition: A | Discharge status: Home / Self Care | Payer: Self-pay | Source: Home / Self Care | Attending: General Surgery

## 2019-12-26 ENCOUNTER — Other Ambulatory Visit: Payer: Self-pay

## 2019-12-26 ENCOUNTER — Ambulatory Visit
Admission: RE | Admit: 2019-12-26 | Discharge: 2019-12-26 | Disposition: A | Discharge status: Home / Self Care | Payer: Medicare PPO | Source: Ambulatory Visit | Attending: General Surgery | Admitting: General Surgery

## 2019-12-26 DIAGNOSIS — Z853 Personal history of malignant neoplasm of breast: Secondary | ICD-10-CM | POA: Insufficient documentation

## 2019-12-26 DIAGNOSIS — Z882 Allergy status to sulfonamides status: Secondary | ICD-10-CM | POA: Diagnosis not present

## 2019-12-26 DIAGNOSIS — Z79899 Other long term (current) drug therapy: Secondary | ICD-10-CM | POA: Diagnosis not present

## 2019-12-26 DIAGNOSIS — C50411 Malignant neoplasm of upper-outer quadrant of right female breast: Secondary | ICD-10-CM | POA: Diagnosis present

## 2019-12-26 DIAGNOSIS — Z170 Estrogen receptor positive status [ER+]: Secondary | ICD-10-CM

## 2019-12-26 DIAGNOSIS — K219 Gastro-esophageal reflux disease without esophagitis: Secondary | ICD-10-CM | POA: Insufficient documentation

## 2019-12-26 DIAGNOSIS — Z79890 Hormone replacement therapy (postmenopausal): Secondary | ICD-10-CM | POA: Insufficient documentation

## 2019-12-26 HISTORY — PX: BREAST LUMPECTOMY WITH RADIOACTIVE SEED AND SENTINEL LYMPH NODE BIOPSY: SHX6550

## 2019-12-26 SURGERY — BREAST LUMPECTOMY WITH RADIOACTIVE SEED AND SENTINEL LYMPH NODE BIOPSY
Anesthesia: General | Site: Breast | Laterality: Right

## 2019-12-26 MED ORDER — LIDOCAINE 2% (20 MG/ML) 5 ML SYRINGE
INTRAMUSCULAR | Status: DC | PRN
  Administered 2019-12-26: 50 mg via INTRAVENOUS

## 2019-12-26 MED ORDER — BUPIVACAINE HCL (PF) 0.25 % IJ SOLN
INTRAMUSCULAR | Status: DC | PRN
  Administered 2019-12-26: 5 mL

## 2019-12-26 MED ORDER — PROPOFOL 10 MG/ML IV BOLUS
INTRAVENOUS | Status: AC
  Filled 2019-12-26: qty 20

## 2019-12-26 MED ORDER — BUPIVACAINE HCL (PF) 0.25 % IJ SOLN
INTRAMUSCULAR | Status: AC
  Filled 2019-12-26: qty 30

## 2019-12-26 MED ORDER — KETOROLAC TROMETHAMINE 30 MG/ML IJ SOLN
INTRAMUSCULAR | Status: DC | PRN
Start: 2019-12-26 — End: 2019-12-26
  Administered 2019-12-26: 15 mg via INTRAVENOUS

## 2019-12-26 MED ORDER — DEXAMETHASONE SODIUM PHOSPHATE 10 MG/ML IJ SOLN
INTRAMUSCULAR | Status: DC | PRN
  Administered 2019-12-26: 5 mg via INTRAVENOUS

## 2019-12-26 MED ORDER — ONDANSETRON HCL 4 MG/2ML IJ SOLN
INTRAMUSCULAR | Status: DC | PRN
  Administered 2019-12-26: 4 mg via INTRAVENOUS

## 2019-12-26 MED ORDER — ROPIVACAINE HCL 5 MG/ML IJ SOLN
INTRAMUSCULAR | Status: DC | PRN
Start: 2019-12-26 — End: 2019-12-26
  Administered 2019-12-26: 30 mL via PERINEURAL

## 2019-12-26 MED ORDER — TRAMADOL HCL 50 MG PO TABS: 100.0000 mg | ORAL_TABLET | Freq: Four times a day (QID) | ORAL | 0 refills | Status: DC | PRN

## 2019-12-26 MED ORDER — CEFAZOLIN SODIUM-DEXTROSE 2-4 GM/100ML-% IV SOLN
INTRAVENOUS | Status: AC
  Filled 2019-12-26: qty 100

## 2019-12-26 MED ORDER — FENTANYL CITRATE (PF) 100 MCG/2ML IJ SOLN
50.0000 ug | INTRAMUSCULAR | Status: DC | PRN
  Administered 2019-12-26: 50 ug via INTRAVENOUS

## 2019-12-26 MED ORDER — ONDANSETRON HCL 4 MG/2ML IJ SOLN
INTRAMUSCULAR | Status: AC
  Filled 2019-12-26: qty 2

## 2019-12-26 MED ORDER — METHYLENE BLUE 0.5 % INJ SOLN
INTRAVENOUS | Status: AC
  Filled 2019-12-26: qty 10

## 2019-12-26 MED ORDER — FENTANYL CITRATE (PF) 100 MCG/2ML IJ SOLN
INTRAMUSCULAR | Status: AC
  Filled 2019-12-26: qty 2

## 2019-12-26 MED ORDER — CEFAZOLIN SODIUM-DEXTROSE 2-4 GM/100ML-% IV SOLN
2.0000 g | INTRAVENOUS | Status: AC
  Administered 2019-12-26: 2 g via INTRAVENOUS

## 2019-12-26 MED ORDER — DEXAMETHASONE SODIUM PHOSPHATE 10 MG/ML IJ SOLN
INTRAMUSCULAR | Status: AC
  Filled 2019-12-26: qty 1

## 2019-12-26 MED ORDER — PHENYLEPHRINE 40 MCG/ML (10ML) SYRINGE FOR IV PUSH (FOR BLOOD PRESSURE SUPPORT)
PREFILLED_SYRINGE | INTRAVENOUS | Status: AC
  Filled 2019-12-26: qty 10

## 2019-12-26 MED ORDER — MIDAZOLAM HCL 2 MG/2ML IJ SOLN
INTRAMUSCULAR | Status: AC
  Filled 2019-12-26: qty 2

## 2019-12-26 MED ORDER — FENTANYL CITRATE (PF) 100 MCG/2ML IJ SOLN
INTRAMUSCULAR | Status: DC | PRN
  Administered 2019-12-26: 25 ug via INTRAVENOUS

## 2019-12-26 MED ORDER — PROPOFOL 10 MG/ML IV BOLUS
INTRAVENOUS | Status: DC | PRN
  Administered 2019-12-26: 120 mg via INTRAVENOUS

## 2019-12-26 MED ORDER — LACTATED RINGERS IV SOLN: INTRAVENOUS | Status: DC

## 2019-12-26 MED ORDER — HYDROMORPHONE HCL 1 MG/ML IJ SOLN: 0.2500 mg | INTRAMUSCULAR | Status: DC | PRN

## 2019-12-26 MED ORDER — PHENYLEPHRINE 40 MCG/ML (10ML) SYRINGE FOR IV PUSH (FOR BLOOD PRESSURE SUPPORT)
PREFILLED_SYRINGE | INTRAVENOUS | Status: DC | PRN
  Administered 2019-12-26 (×9): 40 ug via INTRAVENOUS

## 2019-12-26 MED ORDER — PROMETHAZINE HCL 25 MG/ML IJ SOLN: 6.2500 mg | INTRAMUSCULAR | Status: DC | PRN

## 2019-12-26 MED ORDER — KETOROLAC TROMETHAMINE 30 MG/ML IJ SOLN
INTRAMUSCULAR | Status: AC
  Filled 2019-12-26: qty 1

## 2019-12-26 MED ORDER — SODIUM CHLORIDE (PF) 0.9 % IJ SOLN
INTRAMUSCULAR | Status: AC
  Filled 2019-12-26: qty 10

## 2019-12-26 MED ORDER — MIDAZOLAM HCL 2 MG/2ML IJ SOLN
1.0000 mg | INTRAMUSCULAR | Status: DC | PRN
  Administered 2019-12-26: 2 mg via INTRAVENOUS

## 2019-12-26 MED ORDER — TECHNETIUM TC 99M SULFUR COLLOID FILTERED
1.0000 | Freq: Once | INTRAVENOUS | Status: AC | PRN
  Administered 2019-12-26: 1 via INTRADERMAL

## 2019-12-26 SURGICAL SUPPLY — 61 items
ADH SKN CLS APL DERMABOND .7 (GAUZE/BANDAGES/DRESSINGS) ×1
APL PRP STRL LF DISP 70% ISPRP (MISCELLANEOUS) ×1
APPLIER CLIP 9.375 MED OPEN (MISCELLANEOUS) ×3
APR CLP MED 9.3 20 MLT OPN (MISCELLANEOUS) ×1
BINDER BREAST LRG (GAUZE/BANDAGES/DRESSINGS) IMPLANT
BINDER BREAST MEDIUM (GAUZE/BANDAGES/DRESSINGS) ×2 IMPLANT
BINDER BREAST XLRG (GAUZE/BANDAGES/DRESSINGS) IMPLANT
BINDER BREAST XXLRG (GAUZE/BANDAGES/DRESSINGS) IMPLANT
BLADE SURG 15 STRL LF DISP TIS (BLADE) ×1 IMPLANT
BLADE SURG 15 STRL SS (BLADE) ×6
CANISTER SUC SOCK COL 7IN (MISCELLANEOUS) IMPLANT
CANISTER SUCT 1200ML W/VALVE (MISCELLANEOUS) ×2 IMPLANT
CHLORAPREP W/TINT 26 (MISCELLANEOUS) ×3 IMPLANT
CLIP APPLIE 9.375 MED OPEN (MISCELLANEOUS) IMPLANT
CLIP VESOCCLUDE SM WIDE 6/CT (CLIP) ×3 IMPLANT
CLOSURE WOUND 1/2 X4 (GAUZE/BANDAGES/DRESSINGS) ×1
COVER BACK TABLE 60X90IN (DRAPES) ×3 IMPLANT
COVER MAYO STAND STRL (DRAPES) ×3 IMPLANT
COVER PROBE W GEL 5X96 (DRAPES) ×3 IMPLANT
COVER WAND RF STERILE (DRAPES) IMPLANT
DECANTER SPIKE VIAL GLASS SM (MISCELLANEOUS) IMPLANT
DERMABOND ADVANCED (GAUZE/BANDAGES/DRESSINGS) ×2
DERMABOND ADVANCED .7 DNX12 (GAUZE/BANDAGES/DRESSINGS) ×1 IMPLANT
DRAPE LAPAROSCOPIC ABDOMINAL (DRAPES) ×3 IMPLANT
DRAPE UTILITY XL STRL (DRAPES) ×3 IMPLANT
ELECT COATED BLADE 2.86 ST (ELECTRODE) ×3 IMPLANT
ELECT REM PT RETURN 9FT ADLT (ELECTROSURGICAL) ×3
ELECTRODE REM PT RTRN 9FT ADLT (ELECTROSURGICAL) ×1 IMPLANT
GLOVE BIO SURGEON STRL SZ7 (GLOVE) ×6 IMPLANT
GLOVE BIOGEL PI IND STRL 7.5 (GLOVE) ×1 IMPLANT
GLOVE BIOGEL PI INDICATOR 7.5 (GLOVE) ×6
GOWN STRL REUS W/ TWL LRG LVL3 (GOWN DISPOSABLE) ×2 IMPLANT
GOWN STRL REUS W/TWL LRG LVL3 (GOWN DISPOSABLE) ×6
HEMOSTAT ARISTA ABSORB 3G PWDR (HEMOSTASIS) IMPLANT
KIT MARKER MARGIN INK (KITS) ×3 IMPLANT
NDL HYPO 25X1 1.5 SAFETY (NEEDLE) ×1 IMPLANT
NDL SAFETY ECLIPSE 18X1.5 (NEEDLE) IMPLANT
NEEDLE HYPO 18GX1.5 SHARP (NEEDLE)
NEEDLE HYPO 25X1 1.5 SAFETY (NEEDLE) ×3 IMPLANT
NS IRRIG 1000ML POUR BTL (IV SOLUTION) ×2 IMPLANT
PENCIL SMOKE EVACUATOR (MISCELLANEOUS) ×3 IMPLANT
RETRACTOR ONETRAX LX 90X20 (MISCELLANEOUS) ×2 IMPLANT
SET BASIN DAY SURGERY F.S. (CUSTOM PROCEDURE TRAY) ×3 IMPLANT
SLEEVE SCD COMPRESS KNEE MED (MISCELLANEOUS) ×3 IMPLANT
SPONGE LAP 4X18 RFD (DISPOSABLE) ×3 IMPLANT
STRIP CLOSURE SKIN 1/2X4 (GAUZE/BANDAGES/DRESSINGS) ×2 IMPLANT
SUT ETHILON 2 0 FS 18 (SUTURE) IMPLANT
SUT MNCRL AB 4-0 PS2 18 (SUTURE) ×3 IMPLANT
SUT MON AB 5-0 PS2 18 (SUTURE) IMPLANT
SUT SILK 2 0 SH (SUTURE) ×2 IMPLANT
SUT VIC AB 2-0 SH 27 (SUTURE) ×9
SUT VIC AB 2-0 SH 27XBRD (SUTURE) ×1 IMPLANT
SUT VIC AB 3-0 SH 27 (SUTURE) ×6
SUT VIC AB 3-0 SH 27X BRD (SUTURE) ×1 IMPLANT
SUT VIC AB 5-0 PS2 18 (SUTURE) IMPLANT
SYR CONTROL 10ML LL (SYRINGE) ×3 IMPLANT
TOWEL GREEN STERILE FF (TOWEL DISPOSABLE) ×3 IMPLANT
TRAY FAXITRON CT DISP (TRAY / TRAY PROCEDURE) ×3 IMPLANT
TUBE CONNECTING 20'X1/4 (TUBING) ×1
TUBE CONNECTING 20X1/4 (TUBING) ×1 IMPLANT
YANKAUER SUCT BULB TIP NO VENT (SUCTIONS) ×2 IMPLANT

## 2019-12-26 NOTE — Anesthesia Postprocedure Evaluation (Signed)
Anesthesia Post Note  Patient: Krista Butler  Procedure(s) Performed: RIGHT BREAST LUMPECTOMY WITH RADIOACTIVE SEED AND RIGHT AXILLARY SENTINEL LYMPH NODE BIOPSY (Right Breast)     Patient location during evaluation: PACU Anesthesia Type: General Level of consciousness: awake and alert Pain management: pain level controlled Vital Signs Assessment: post-procedure vital signs reviewed and stable Respiratory status: spontaneous breathing, nonlabored ventilation and respiratory function stable Cardiovascular status: blood pressure returned to baseline and stable Postop Assessment: no apparent nausea or vomiting Anesthetic complications: no    Last Vitals:  Vitals:   12/26/19 1600 12/26/19 1611  BP: 123/67 125/70  Pulse: 82 83  Resp: 15 16  Temp:  36.5 C  SpO2: 99% 100%    Last Pain:  Vitals:   12/26/19 1611  TempSrc:   PainSc: 0-No pain                 Lynda Rainwater

## 2019-12-26 NOTE — Transfer of Care (Signed)
Immediate Anesthesia Transfer of Care Note  Patient: Krista Butler  Procedure(s) Performed: RIGHT BREAST LUMPECTOMY WITH RADIOACTIVE SEED AND RIGHT AXILLARY SENTINEL LYMPH NODE BIOPSY (Right Breast)  Patient Location: PACU  Anesthesia Type:General and Regional  Level of Consciousness: drowsy and patient cooperative  Airway & Oxygen Therapy: Patient Spontanous Breathing  Post-op Assessment: Report given to RN and Post -op Vital signs reviewed and stable  Post vital signs: Reviewed and stable  Last Vitals:  Vitals Value Taken Time  BP 119/62 12/26/19 1535  Temp    Pulse 85 12/26/19 1538  Resp 24 12/26/19 1538  SpO2 97 % 12/26/19 1538  Vitals shown include unvalidated device data.  Last Pain:  Vitals:   12/26/19 1345  TempSrc:   PainSc: 0-No pain      Patients Stated Pain Goal: 2 ([id number])  Complications: No apparent anesthesia complications

## 2019-12-26 NOTE — Anesthesia Preprocedure Evaluation (Signed)
Anesthesia Evaluation  Patient identified by MRN, date of birth, ID band Patient awake    Reviewed: Allergy & Precautions, H&P , NPO status , Patient's Chart, lab work & pertinent test results  Airway Mallampati: I  TM Distance: >3 FB Neck ROM: Full    Dental  (+) Teeth Intact, Dental Advisory Given   Pulmonary    breath sounds clear to auscultation       Cardiovascular  Rhythm:Regular Rate:Normal     Neuro/Psych    GI/Hepatic GERD  Medicated and Controlled,  Endo/Other    Renal/GU      Musculoskeletal   Abdominal   Peds  Hematology   Anesthesia Other Findings Breast Cancer  Reproductive/Obstetrics                             Anesthesia Physical  Anesthesia Plan  ASA: III  Anesthesia Plan: General   Post-op Pain Management:  Regional for Post-op pain   Induction: Intravenous  PONV Risk Score and Plan: 3 and Ondansetron, Dexamethasone and Midazolam  Airway Management Planned: LMA  Additional Equipment:   Intra-op Plan:   Post-operative Plan: Extubation in OR  Informed Consent: I have reviewed the patients History and Physical, chart, labs and discussed the procedure including the risks, benefits and alternatives for the proposed anesthesia with the patient or authorized representative who has indicated his/her understanding and acceptance.       Plan Discussed with: CRNA, Anesthesiologist and Surgeon  Anesthesia Plan Comments:         Anesthesia Quick Evaluation

## 2019-12-26 NOTE — Discharge Instructions (Signed)
[place] Office Phone Number [phone]  BREAST BIOPSY/ PARTIAL MASTECTOMY: POST OP INSTRUCTIONS Take 400 mg of ibuprofen every 8 hours or 650 mg tylenol every 6 hours for next 72 hours then as needed. No Ibuprofen until 11:15 PM on 12/26/2019. Use ice several times daily also. Always review your discharge instruction sheet given to you by the facility where your surgery was performed.  IF YOU HAVE DISABILITY OR FAMILY LEAVE FORMS, YOU MUST BRING THEM TO THE OFFICE FOR PROCESSING.  DO NOT GIVE THEM TO YOUR DOCTOR.  1. A prescription for pain medication may be given to you upon discharge.  Take your pain medication as prescribed, if needed.  If narcotic pain medicine is not needed, then you may take acetaminophen (Tylenol), naprosyn (Alleve) or ibuprofen (Advil) as needed. 2. Take your usually prescribed medications unless otherwise directed 3. If you need a refill on your pain medication, please contact your pharmacy.  They will contact our office to request authorization.  Prescriptions will not be filled after 5pm or on week-ends. 4. You should eat very light the first 24 hours after surgery, such as soup, crackers, pudding, etc.  Resume your normal diet the day after surgery. 5. Most patients will experience some swelling and bruising in the breast.  Ice packs and a good support bra will help.  Wear the breast binder provided or a sports bra for 72 hours day and night.  After that wear a sports bra during the day until you return to the office. Swelling and bruising can take several days to resolve.  6. It is common to experience some constipation if taking pain medication after surgery.  Increasing fluid intake and taking a stool softener will usually help or prevent this problem from occurring.  A mild laxative (Milk of Magnesia or Miralax) should be taken according to package directions if there are no bowel movements after 48 hours. 7. Unless discharge instructions indicate  otherwise, you may remove your bandages 48 hours after surgery and you may shower at that time.  You may have steri-strips (small skin tapes) in place directly over the incision.  These strips should be left on the skin for 7-10 days and will come off on their own.  If your surgeon used skin glue on the incision, you may shower in 24 hours.  The glue will flake off over the next 2-3 weeks.  Any sutures or staples will be removed at the office during your follow-up visit. 8. ACTIVITIES:  You may resume regular daily activities (gradually increasing) beginning the next day.  Wearing a good support bra or sports bra minimizes pain and swelling.  You may have sexual intercourse when it is comfortable. a. You may drive when you no longer are taking prescription pain medication, you can comfortably wear a seatbelt, and you can safely maneuver your car and apply brakes. b. RETURN TO WORK:  ______________________________________________________________________________________ 9. You should see your doctor in the office for a follow-up appointment approximately two weeks after your surgery.  Your doctor's nurse will typically make your follow-up appointment when she calls you with your pathology report.  Expect your pathology report 3-4 business days after your surgery.  You may call to check if you do not hear from [place] after three days. 10. OTHER INSTRUCTIONS: _______________________________________________________________________________________________ _____________________________________________________________________________________________________________________________________ _____________________________________________________________________________________________________________________________________ _____________________________________________________________________________________________________________________________________  WHEN TO CALL DR WAKEFIELD: 1. Fever over 101.0 2. Nausea and/or  vomiting. 3. Extreme swelling or bruising. 4. Continued bleeding from incision. 5. Increased pain, redness, or  drainage from the incision.  The clinic staff is available to answer your questions during regular business hours.  Please don't hesitate to call and ask to speak to one of the nurses for clinical concerns.  If you have a medical emergency, go to the nearest emergency room or call 911.  A surgeon from [facility] is always on call at the hospital.  For further questions, please visit [email or web address]     Post Anesthesia Home Care Instructions  Activity: Get plenty of rest for the remainder of the day. A responsible individual must stay with you for 24 hours following the procedure.  For the next 24 hours, DO NOT: -Drive a car -Paediatric nurse -Drink alcoholic beverages -Take any medication unless instructed by your physician -Make any legal decisions or sign important papers.  Meals: Start with liquid foods such as gelatin or soup. Progress to regular foods as tolerated. Avoid greasy, spicy, heavy foods. If nausea and/or vomiting occur, drink only clear liquids until the nausea and/or vomiting subsides. Call your physician if vomiting continues.  Special Instructions/Symptoms: Your throat may feel dry or sore from the anesthesia or the breathing tube placed in your throat during surgery. If this causes discomfort, gargle with warm salt water. The discomfort should disappear within 24 hours.  If you had a scopolamine patch placed behind your ear for the management of post- operative nausea and/or vomiting:  1. The medication in the patch is effective for 72 hours, after which it should be removed.  Wrap patch in a tissue and discard in the trash. Wash hands thoroughly with soap and water. 2. You may remove the patch earlier than 72 hours if you experience unpleasant side effects which may include dry mouth, dizziness or visual disturbances. 3.  Avoid touching the patch. Wash your hands with soap and water after contact with the patch.

## 2019-12-26 NOTE — Interval H&P Note (Signed)
History and Physical Interval Note:  12/26/2019 1:57 PM  Krista Butler  has presented today for surgery, with the diagnosis of RIGHT BREAST CANCER.  The various methods of treatment have been discussed with the patient and family. After consideration of risks, benefits and other options for treatment, the patient has consented to  Procedure(s) with comments: RIGHT BREAST LUMPECTOMY WITH RADIOACTIVE SEED AND RIGHT AXILLARY SENTINEL LYMPH NODE BIOPSY (Right) - PEC BLOCK as a surgical intervention.  The patient's history has been reviewed, patient examined, no change in status, stable for surgery.  I have reviewed the patient's chart and labs.  Questions were answered to the patient's satisfaction.     Rolm Bookbinder

## 2019-12-26 NOTE — Op Note (Signed)
Preoperative diagnosis: clinical stage IIright breast cancer Postoperative diagnosis: saa Procedure: 1. Right breast seed guided lumpectomy 2. Right deep axillary sentinel node biopsy Surgeon: Dr Serita Grammes Anesthesia: general with pec block EBL: 25 cc Specimens: 1.Rightbreast lumpectomy containing both seed and clip 2. Deeprightaxillary nodeswithhighest count was638 3. Additional right breast margins inferior, anterior, and medial margins marked short superior,long lateral double deep Complications none Drains none Sponge and needle count correct dispo recovery stable  Indications:  1 yof with prior left breast dcis that was er pos, pr negative. she was treated with lumpectomy and radiotherapy followed by five years of tamoxifen which she stopped in 2019. she has done well until she recently palpated a lateral right breast mass. she has no dc. she had mm that shows d density breasts and a distortion in that area. US shows a 2.6x1.6x2.4 cm mass that is very close to muscle. [place] axilla negative. 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 her mri did show anything additional. We discussed lumpectomy and sn biopsy.   Procedure: She hadher seedplaced prior to beginning. After informed consent was obtained she first underwent a pectoral block. Antibiotics were given. SCDs were in place. She underwent injection of technetium in the standard periareolar fashion. She was then placed under general anesthesia. She was prepped and draped in the standard sterile surgical fashion. A surgical timeout was then performed..  I made a low axillary incision and ended up doing both the nodes and the breast through the same incision.  I tunneled to the seed  I then used the neoprobe to remove the breast cancer with an attempt to get a clear margin. Mammogram confirmed removal of the seed and clip.I did remove additional margins as above.The posterior  margin is the muscle now.  I then obtained hemostasis. I then placed clips in the cavity.I pulled the breast tissue together with 2-0 vicryl.  I then entered the axilla. I removed what appear to be a couple sentinel nodes with highest radioactivity as above. There was no background radioactivity. Hemostasis was obtained. I closed the axillary fasciawith 2-0 Vicryl. I then closed the dermis with 3-0 Vicryl and the skin with 4-0 Monocryl. Glue and Steri-Strips were applied. She tolerated this well. A binder was placed. She was extubated and transferred to recovery in stable condition.

## 2019-12-26 NOTE — Anesthesia Procedure Notes (Signed)
Procedure Name: LMA Insertion Date/Time: 12/26/2019 2:27 PM Performed by: Gwyndolyn Saxon, CRNA Pre-anesthesia Checklist: Patient identified, Emergency Drugs available, Suction available and Patient being monitored Patient Re-evaluated:Patient Re-evaluated prior to induction Oxygen Delivery Method: Circle system utilized Preoxygenation: Pre-oxygenation with 100% oxygen Induction Type: IV induction Ventilation: Mask ventilation without difficulty LMA: LMA inserted LMA Size: 4.0 Number of attempts: 1 Placement Confirmation: positive ETCO2 and breath sounds checked- equal and bilateral Tube secured with: Tape Dental Injury: Teeth and Oropharynx as per pre-operative assessment

## 2019-12-26 NOTE — Progress Notes (Signed)
Assisted Dr. Miller with right, ultrasound guided, pectoralis block. Side rails up, monitors on throughout procedure. See vital signs in flow sheet. Tolerated Procedure well. 

## 2019-12-26 NOTE — H&P (Signed)
69 yof with prior left breast dcis that was er pos, pr negative. she was treated with lumpectomy and radiotherapy followed by five years of tamoxifen which she stopped in 2019. she has done well until she recently palpated a lateral right breast mass. she has no dc. she had mm that shows d density breasts and a distortion in that area. US shows a 2.6x1.6x2.4 cm mass that is very close to muscle. [place] axilla negative. 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    Past Surgical History Illene Regulus, CMA; 11/22/2019 10:45 AM) Breast Biopsy  Bilateral. Breast Mass; Local Excision  Left. Colon Polyp Removal - Colonoscopy  Oral Surgery  Sentinel Lymph Node Biopsy  Tonsillectomy   Diagnostic Studies History Lars Mage Spillers, CMA; 11/22/2019 10:45 AM) Colonoscopy  5-10 years ago Mammogram  within last year Pap Smear  1-5 years ago  Allergies Illene Regulus, CMA; 11/22/2019 10:46 AM) Sulfa Drugs   Medication History (Alisha Spillers, CMA; 11/22/2019 10:47 AM) Levothyroxine Sodium (175MCG Tablet, Oral) Active. Biotin (1000MCG Tablet, Oral) Active. Medications Reconciled  Social History Illene Regulus, CMA; 11/22/2019 10:45 AM) Alcohol use  Occasional alcohol use. Caffeine use  Coffee, Tea. No drug use  Tobacco use  Never smoker.  Family History Illene Regulus, [place]; 11/22/2019 10:45 AM) Alcohol Abuse  Father. Arthritis  Mother. Depression  Sister. Diabetes Mellitus  Father. Heart Disease  Father, Mother. Heart disease in female family member before age 82  Hypertension  Mother.  Pregnancy / Birth History Illene Regulus, CMA; 11/22/2019 10:45 AM) Age at menarche  69 years. Age of menopause  51-55 Contraceptive History  Intrauterine device, Oral contraceptives. Gravida  2 Irregular periods  Length (months) of breastfeeding  3-6 Maternal age  22-25 Para  2  Other Problems Illene Regulus, CMA; 11/22/2019 10:45  AM) Breast Cancer  Gastroesophageal Reflux Disease  Lump In Breast  Melanoma  Thyroid Disease     Review of Systems (Alisha Spillers CMA; 11/22/2019 10:45 AM) General Present- Weight Gain. Not Present- Appetite Loss, Chills, Fatigue, Fever, Night Sweats and Weight Loss. HEENT Present- Seasonal Allergies, Sinus Pain and Wears glasses/contact lenses. Not Present- Earache, Hearing Loss, Hoarseness, Nose Bleed, Oral Ulcers, Ringing in the Ears, Sore Throat, Visual Disturbances and Yellow Eyes. Respiratory Present- Snoring. Not Present- Bloody sputum, Chronic Cough, Difficulty Breathing and Wheezing. Breast Present- Breast Mass. Not Present- Breast Pain, Nipple Discharge and Skin Changes. Cardiovascular Not Present- Chest Pain, Difficulty Breathing Lying Down, Leg Cramps, Palpitations, Rapid Heart Rate, Shortness of Breath and Swelling of Extremities. Gastrointestinal Not Present- Abdominal Pain, Bloating, Bloody Stool, Change in Bowel Habits, Chronic diarrhea, Constipation, Difficulty Swallowing, Excessive gas, Gets full quickly at meals, Hemorrhoids, Indigestion, Nausea, Rectal Pain and Vomiting. Female Genitourinary Not Present- Frequency, Nocturia, Painful Urination, Pelvic Pain and Urgency. Musculoskeletal Not Present- Back Pain, Joint Pain, Joint Stiffness, Muscle Pain, Muscle Weakness and Swelling of Extremities. Neurological Not Present- Decreased Memory, Fainting, Headaches, Numbness, Seizures, Tingling, Tremor, Trouble walking and Weakness. Psychiatric Not Present- Anxiety, Bipolar, Change in Sleep Pattern, Depression, Fearful and Frequent crying. Endocrine Not Present- Cold Intolerance, Excessive Hunger, Hair Changes, Heat Intolerance, Hot flashes and New Diabetes. Hematology Present- Easy Bruising. Not Present- Blood Thinners, Excessive bleeding, Gland problems, HIV and Persistent Infections.  Vitals (Alisha Spillers CMA; 11/22/2019 10:46 AM) 11/22/2019 10:46 AM Weight: 148 lb  Height: 66.5in Body Surface Area: 1.77 m Body Mass Index: 23.53 kg/m  Pulse: 96 (Regular)  BP: 100/62(Sitting, Left Arm,  Standard)       Physical Exam Rolm Bookbinder MD; 11/22/2019 1:31 PM) General Mental Status-Alert. Orientation-Oriented X3.  Breast Nipples-No Discharge. Note: right lateral deep 2 cm mass with hematoma from biopsy     Lymphatic Head & Neck  General Head & Neck Lymphatics: Bilateral - Description - Normal. Axillary  General Axillary Region: Bilateral - Description - Normal. Note: no [place] adenopathy     Assessment & Plan Rolm Bookbinder MD; 11/22/2019 1:32 PM) BREAST CANCER OF UPPER-OUTER QUADRANT OF RIGHT FEMALE BREAST (C50.411) Lumpectomy/sn biopsy We discussed the staging and pathophysiology of breast cancer. We discussed all of the different options for treatment for breast cancer including surgery, chemotherapy, radiation therapy, and antiestrogen therapy. We discussed a sentinel lymph node biopsy as she does not appear to having lymph node involvement right now. We discussed the performance of that with injection of radioactive tracer. We discussed that there is a chance of having a positive node with a sentinel lymph node biopsy and we will await the permanent pathology to make any other first further decisions in terms of her treatment. We discussed the options for treatment of the breast cancer which included lumpectomy versus a mastectomy. We discussed the performance of the lumpectomy with radioactive seed placement. We discussed a 5-10% chance of a positive margin requiring reexcision in the operating room. We also discussed that she will likely need radiation therapy if she undergoes lumpectomy. We discussed mastectomy and the postoperative care for that as well.. The decision for lumpectomy vs mastectomy has no impact on decision for chemotherapy. Most mastectomy patients will not need radiation therapy. We discussed that there  is no difference in her survival whether she undergoes lumpectomy with radiation therapy or antiestrogen therapy versus a mastectomy. There is also no real difference between her recurrence in the breast.

## 2019-12-26 NOTE — Progress Notes (Signed)
At bedside with Nuclear Medicine [facility]) during sentinel node injection of right breast.

## 2019-12-26 NOTE — Anesthesia Procedure Notes (Signed)
Anesthesia Regional Block: Pectoralis block   Pre-Anesthetic Checklist: ,, timeout performed, Correct Patient, Correct Site, Correct Laterality, Correct Procedure, Correct Position, site marked, Risks and benefits discussed,  Surgical consent,  Pre-op evaluation,  At surgeon's request and post-op pain management  Laterality: Right  Prep: chloraprep       Needles:  Injection technique: Single-shot  Needle Type: Stimiplex     Needle Length: 9cm  Needle Gauge: 21     Additional Needles:   Procedures:,,,, ultrasound used (permanent image in chart),,,,  Narrative:  Start time: 12/26/2019 1:34 PM End time: 12/26/2019 1:39 PM Injection made incrementally with aspirations every 5 mL.  Performed by: Personally  Anesthesiologist: Lynda Rainwater, MD

## 2019-12-27 ENCOUNTER — Encounter: Payer: Self-pay | Admitting: *Deleted

## 2019-12-27 ENCOUNTER — Ambulatory Visit: Payer: Medicare PPO | Admitting: Oncology

## 2019-12-28 ENCOUNTER — Encounter: Payer: Self-pay | Admitting: Oncology

## 2020-01-03 ENCOUNTER — Encounter: Payer: Self-pay | Admitting: *Deleted

## 2020-01-07 ENCOUNTER — Other Ambulatory Visit: Payer: Self-pay

## 2020-01-07 ENCOUNTER — Encounter: Payer: Self-pay | Admitting: Radiation Oncology

## 2020-01-07 ENCOUNTER — Telehealth: Payer: Self-pay

## 2020-01-07 NOTE — Telephone Encounter (Signed)
Spoke with patient in regards to virtual visit with Shona Simpson PA on 01/10/20 at 8:30am. Advised patient of the appointment date and time. Patient verbalized understanding of the type of visit and when to log on. Meaningful use questions were reviewed today. TM

## 2020-01-09 LAB — SURGICAL PATHOLOGY

## 2020-01-10 ENCOUNTER — Ambulatory Visit
Admission: RE | Admit: 2020-01-10 | Discharge: 2020-01-10 | Disposition: A | Discharge status: Home / Self Care | Payer: Medicare PPO | Source: Ambulatory Visit | Attending: Radiation Oncology | Admitting: Radiation Oncology

## 2020-01-10 DIAGNOSIS — Z170 Estrogen receptor positive status [ER+]: Secondary | ICD-10-CM

## 2020-01-10 NOTE — Progress Notes (Signed)
[place]  [phone] [phone]    D: Krista Butler  DOB: [birth year]  MR#: [id number]  JOI#:[id number]   Patient Care Team: Krista Bowen, MD as PCP - General (Endocrinology) Krista Butler, Krista Dad, MD as Consulting Physician (Oncology) Krista Rudd, MD as Consulting Physician (Radiation Oncology) Krista Bookbinder, MD as Consulting Physician (General Surgery) Krista Posey, MD as Consulting Physician (Obstetrics and Gynecology) OTHER MD:   CHIEF COMPLAINT: new estrogen receptor positive right breast cancer; history of noninvasive left breast cancer.  CURRENT THERAPY: Adjuvant radiation pending, and anastrozole to follow   INTERVAL HISTORY: Krista Butler returns today for follow up of her newly diagnosed right breast cancer.   She underwent genetic counseling on 11/26/2019, which was negative.  Oncotype DX was obtained on the biopsy sample and the recurrence score of 22 predicts a risk of recurrence outside the breast over the next 9 years of 8%, if the patient's only systemic therapy is an antiestrogen for 5 years.  It also predicts no benefit from chemotherapy.  She underwent breast MRI on 12/04/2019 showing: breast composition D; 2.2 cm known upper-outer right breast malignancy; no evidence of malignancy in the left breast or bilateral axillae.  She opted to proceed with right lumpectomy on 12/26/2019 under Dr. Donne Butler. Pathology from the procedure [phone]) showed: invasive lobular carcinoma, grade 2, 2.0 cm; lobular carcinoma in situ; margins uninvolved.  All 6 lymph nodes removed were negative for carcinoma (0/6).  She met with Dr. Lisbeth Butler yesterday, 01/10/2020, to discuss radiation therapy. She underwent CT simulation earlier this morning. She is scheduled to receive treatment from 01/21/2020 through 02/14/2020.   REVIEW OF SYSTEMS:   Krista Butler recovered very well from her surgery, has no significant pain in the right breast.  She actually has some  shooting pains in the left breast which concerned her.  These are very brief and are consistent with what many of my patients called "zingers" or "ouchies".  She understands pain in the breast is generally benign as is the case here.  She has been extremely busy caring for her brother-in-law with Down syndrome.  He is now in a facility and that has its own set of concerns and stresses.  Aside from this a detailed review of systems today was stable   RIGHT BREAST CANCER HISTORY: From the original intake note:   She underwent bilateral diagnostic mammography with tomography and right breast ultrasonography at [facility] on 11/08/2019 showing: breast density category D; suspicious 2.5 cm right breast mass at 10 o'clock, muscle involvement cannot be excluded; no evidence of left breast malignancy or lymphadenopathy.  She proceeded to biopsy on 11/13/2019 of the right breast area in question. Pathology from the procedure ([id number]) revealed: invasive mammary carcinoma, grade 2, e-cadherin negative. Prognostic indicators significant for: estrogen receptor 95% positive, progesterone receptor 95% positive, both with a strong staining intensity. Proliferation marker Ki67 of 20%. Her2 negative by immunohistochemistry (1+).   History of left breast cancer: From Dr Krista Butler earlier note:  #1Patient noted some thickening of the right breast. She did not have any symptoms in the left breast. The thickening did not show any discrete mass or distortion on the right. However the patient was found to have numerous bilateral calcifications. Within the left breast there was a new linear/branching calcifications measuring 12 mm. This was suspicious for malignancy. There are also 1.1 cm suspicious calcifications in a heterogeneous group within the upper inner quadrant of the right breast. She underwent stereotactic biopsy of  both of these areas  #2The biopsy on the right was negative and only noted to be a  fibroadenoma with associated coarse calcifications. 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  #3 patient had a MRI performed that showed a clot area of enhancement in the retroareolar region of the left breast measuring 1 cm in maximum does mention corresponding to the known DCIS.  #4 patient is now status post left-sided lumpectomy on 11/23/2012. Her pathology revealed DCIS grade 3. Margins were negative with the closest margin being 1 mm from the inferior posterior margins. Tumor was ER ER positive PR positive.  #5 status post completion of radiation therapy to the left breast on 02/08/2013. #6 patient will proceed with after her radiation with chemoprevention with tamoxifen 20 mg daily. But this will begin after she completes radiation therapy.  #7 NSABP B. 43 clinical study enrollment. He did meet with the nurse and her tissue will be sent for HER-2 testing. HER-2 negative and therefore did not enroll on B. 43 trial  #8 patient began adjuvant curative intent tamoxifen 20 mg daily starting 02/26/2013. Total of 5 years of therapy is planned.   MEDICAL HISTORY: Past Medical History:  Diagnosis Date  . Allergy    medicated for this  . Arm fracture    LEFT arm; as a child  . Breast cancer ([place])    left  . Diverticulosis   . Family history of breast cancer   . Family history of kidney cancer   . GERD (gastroesophageal reflux disease)   . History of radiation therapy 12/25/12-02/08/13   64.4 Gy to left breast  . Osteoporosis   . Personal history of radiation therapy   . Thyroid disease   . Wears glasses     SURGICAL HISTORY:  Past Surgical History:  Procedure Laterality Date  . BREAST BIOPSY  1997   lt breast cyst  . BREAST LUMPECTOMY Left 2014  . BREAST LUMPECTOMY WITH NEEDLE LOCALIZATION Left 11/23/2012   Procedure: BREAST LUMPECTOMY WITH NEEDLE LOCALIZATION;  Surgeon: Krista Bookbinder, MD;   Location: [facility];  Service: General;  Laterality: Left;  . BREAST LUMPECTOMY WITH RADIOACTIVE SEED AND SENTINEL LYMPH NODE BIOPSY Right 12/26/2019   Procedure: RIGHT BREAST LUMPECTOMY WITH RADIOACTIVE SEED AND RIGHT AXILLARY SENTINEL LYMPH NODE BIOPSY;  Surgeon: Krista Bookbinder, MD;  Location: [facility];  Service: General;  Laterality: Right;  . COLONOSCOPY    . TONSILLECTOMY    . UPPER GI ENDOSCOPY  2012    FAMILY HISTORY: Family History  Problem Relation Age of Onset  . Heart disease Mother   . Stroke Father   . Stroke Maternal Grandmother   . Stroke Maternal Grandfather   . Heart Problems Paternal Grandfather   . Breast cancer Cousin        dx. younger than 23 (paternal cousin)  . Cancer Cousin        dx. in her 48s, unknown type (paternal cousin)  . Kidney cancer Cousin 40       (maternal cousin)  . Other Cousin        genetic testing - unknown results (maternal cousin)  . Colon cancer Neg Hx   . Esophageal cancer Neg Hx   . Pancreatic cancer Neg Hx   . Stomach cancer Neg Hx   . Liver disease Neg Hx    The patient's father died at the age of 53 following  a stroke in the setting of severe heart disease. The patient's mother died also from heart disease at the age of 69. The patient had no brothers, one sister. There is no history of breast or ovarian cancer in the family.   GYN HISTORY: Menarche age 60, first live birth age 77. The patient is GX P2. She went through menopause in her early 93s. She took hormone replacement briefly (approximately 2 years).   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 Her husband of 40+ years, Zenia Resides, used to work for [employer] but is now a Chief Strategy Officer. Daughter Geroge Baseman is a homemaker in [place]. Son Lovey Newcomer is a tool and dye Mining engineer. The patient has 5 grandchildren. She has one great grandchild. She attends the home church in [place].   ADVANCED  DIRECTIVES: Not in place   HEALTH MAINTENANCE: Social History   Tobacco Use  . Smoking status: Never Smoker  . Smokeless tobacco: Never Used  Substance Use Topics  . Alcohol use: Yes    Alcohol/week: 2.0 standard drinks    Types: 2 Glasses of wine per week    Comment: occ  . Drug use: No     Colonoscopy  Pap smear  DEXA scan  Lipid panel  Current Outpatient Medications  Medication Sig Dispense Refill  . anastrozole (ARIMIDEX) 1 MG tablet Take 1 tablet (1 mg total) by mouth daily. Start March 10, 2020 90 tablet 4  . Biotin 1000 MCG tablet Take 1,000 mcg by mouth daily.    Marland Kitchen levothyroxine (SYNTHROID, LEVOTHROID) 175 MCG tablet Take 175 mcg by mouth daily before breakfast.    . Probiotic Product (PROBIOTIC DAILY) CAPS Take 1 capsule by mouth daily.    . traMADol (ULTRAM) 50 MG tablet Take 2 tablets (100 mg total) by mouth every 6 (six) hours as needed. 10 tablet 0  . UNABLE TO FIND Patient takes numerous supplements and OTCs including CoQ10, vit C, loratadine, pre-biotic poweder, collagen syrup    . zinc gluconate 50 MG tablet Take 50 mg by mouth daily. 30 mg     No current facility-administered medications for this visit.    PHYSICAL EXAMINATION:  white woman in no acute distress Blood pressure (!) 104/58, pulse 62, temperature (!) 97.4 F (36.3 C), temperature source Tympanic, resp. rate 18, height 5' 7"  (1.702 m), weight 145 lb 9.6 oz (66 kg), SpO2 98 %. Body mass index is 22.8 kg/m.   ECOG PERFORMANCE STATUS: 1 - Symptomatic but completely ambulatory  Sclerae unicteric, EOMs intact Wearing a mask No cervical or supraclavicular adenopathy Lungs no rales or rhonchi Heart regular rate and rhythm Abd soft, nontender, positive bowel sounds MSK no focal spinal tenderness, no upper extremity lymphedema Neuro: nonfocal, well oriented, appropriate affect Breasts: The right breast is status post recent lumpectomy.  The cosmetic result is excellent.  There is no dehiscence,  erythema, or swelling.  The left breast is status post remote lumpectomy.  There is no evidence of local recurrence.  Both axillae are benign.   LABORATORY DATA: Lab Results  Component Value Date   WBC 4.8 11/26/2019   HGB 14.4 11/26/2019   HCT 43.9 11/26/2019   MCV 94.0 11/26/2019   PLT 251 11/26/2019      Chemistry      Component Value Date/Time   NA 142 11/26/2019 1159   NA 142 06/13/2013 1317   K 4.4 11/26/2019 1159   K 4.0 06/13/2013 1317   CL 103  11/26/2019 1159   CL 102 11/15/2012 1224   CO2 28 11/26/2019 1159   CO2 27 06/13/2013 1317   BUN 12 11/26/2019 1159   BUN 13.0 06/13/2013 1317   CREATININE 0.73 11/26/2019 1159   CREATININE 0.7 06/13/2013 1317      Component Value Date/Time   CALCIUM 9.6 11/26/2019 1159   CALCIUM 10.1 06/13/2013 1317   ALKPHOS 114 11/26/2019 1159   ALKPHOS 81 06/13/2013 1317   AST 27 11/26/2019 1159   AST 22 06/13/2013 1317   ALT 30 11/26/2019 1159   ALT 16 06/13/2013 1317   BILITOT 0.6 11/26/2019 1159   BILITOT 0.39 06/13/2013 1317       RESULTS: NM Sentinel Node Inj-No Rpt (Breast)  Result Date: 12/26/2019 Sulfur colloid was injected by the nuclear medicine technologist for melanoma sentinel node.   MM Breast Surgical Specimen  Result Date: 12/26/2019 CLINICAL DATA:  Status post RIGHT lumpectomy following radioactive seed localization. EXAM: SPECIMEN RADIOGRAPH OF THE RIGHT BREAST COMPARISON:  Previous exam(s). FINDINGS: Status post excision of the right breast. The radioactive seed and ribbon shaped biopsy marker clip are present, completely intact, and were marked for pathology. IMPRESSION: Specimen radiograph of the RIGHT breast. Electronically Signed   By: Nolon Nations M.D.   On: 12/26/2019 14:56   [place] RT RADIOACTIVE SEED LOC  Result Date: 12/25/2019 CLINICAL DATA:  Patient presents for seed localization of the RIGHT breast. Recent ultrasound-guided core biopsy shows grade 2 invasive mammary carcinoma in the 10 o'clock  location of the RIGHT breast. EXAM: ULTRASOUND GUIDED RADIOACTIVE SEED LOCALIZATION OF THE RIGHT BREAST COMPARISON:  Previous exam(s). FINDINGS: Patient presents for radioactive seed localization prior to lumpectomy. I met with the patient and we discussed the procedure of seed localization including benefits and alternatives. We discussed the high likelihood of a successful procedure. We discussed the risks of the procedure including infection, bleeding, tissue injury and further surgery. We discussed the low dose of radioactivity involved in the procedure. Informed, written consent was given. The usual time-out protocol was performed immediately prior to the procedure. Using ultrasound guidance, sterile technique, 1% lidocaine and an I-125 radioactive seed, the ribbon shaped clip within the mass in the 10 o'clock location of the RIGHT breast was localized using a inferior to superior approach. The follow-up mammogram images confirm the seed in the expected location and were marked for Dr. Donne Butler. Follow-up survey of the patient confirms presence of the radioactive seed. Order number of I-125 seed:  [id number]. Total activity:  8.546 millicuries reference Date: 12/20/2019 The patient tolerated the procedure well and was released from the [facility]. She was given instructions regarding seed removal. IMPRESSION: Radioactive seed localization right breast. No apparent complications. Electronically Signed   By: Nolon Nations M.D.   On: 12/25/2019 16:23   MM CLIP PLACEMENT RIGHT  Result Date: 12/25/2019 CLINICAL DATA:  Status post ultrasound-guided placement of radioactive seed the 10 o'clock location of the RIGHT breast. EXAM: DIAGNOSTIC RIGHT MAMMOGRAM POST ULTRASOUND-GUIDED RADIOACTIVE SEED PLACEMENT COMPARISON:  Previous exam(s). FINDINGS: Mammographic images were obtained following ultrasound-guided radioactive seed placement. These demonstrate the radioactive seed immediately adjacent to the ribbon  shaped clip in the UPPER-OUTER QUADRANT of the RIGHT breast, as expected. IMPRESSION: Appropriate location of the radioactive seed. Final Assessment: Post Procedure Mammograms for Seed Placement Electronically Signed   By: Nolon Nations M.D.   On: 12/25/2019 16:24     ASSESSMENT:  69 y.o. Krista Butler, [place] woman:  LEFT BREAST CANCER: (1) status post left  lumpectomy 11/23/2012 for ductal carcinoma in situ, grade 3, estrogen receptor 100% positive, progesterone receptor 0% positive, with -4 very close margins  (2) considered  NSABP B-43  but was found to be HER-2 negative ([id number])  (3) completed adjuvant radiation therapy on 02/08/2013.  (4) tamoxifen 20 mg daily started August 2014, discontinued August 2019  RIGHT BREAST CANCER: (5) status post right breast upper outer quadrant biopsy 11/13/2019 for a clinical T2 N0, stage IB invasive lobular carcinoma, E-cadherin negative, estrogen and progesterone receptor positive, with an MIB-1 of 20% and no HER-2 amplification  (6) genetics testing 12/03/2019 through the [facility] Breast Cancer STAT + Multi-Cancer panels found no deleterious mutations in ATM, BRCA1, BRCA2, CDH1, CHEK2, PALB2, PTEN, STK11 and TP53. The Multi-Cancer Panel offered by [facility] includes sequencing and/or deletion duplication testing of the following 85 genes: AIP, ALK, APC, ATM, AXIN2,BAP1,  BARD1, BLM, BMPR1A, BRCA1, BRCA2, BRIP1, CASR, CDC73, CDH1, CDK4, CDKN1B, CDKN1C, CDKN2A (p14ARF), CDKN2A (p16INK4a), CEBPA, CHEK2, CTNNA1, DICER1, DIS3L2, EGFR (c.2369C>T, p.Thr790Met variant only), EPCAM (Deletion/duplication testing only), FH, FLCN, GATA2, GPC3, GREM1 (Promoter region deletion/duplication testing only), HOXB13 (c.251G>A, p.Gly84Glu), HRAS, KIT, MAX, MEN1, MET, MITF (c.952G>A, p.Glu318Lys variant only), MLH1, MSH2, MSH3, MSH6, MUTYH, NBN, NF1, NF2, NTHL1, PALB2, PDGFRA, PHOX2B, PMS2, POLD1, POLE, POT1, PRKAR1A, PTCH1, PTEN, RAD50, RAD51C, RAD51D, RB1, RECQL4, RET, RNF43, RUNX1,  SDHAF2, SDHA (sequence changes only), SDHB, SDHC, SDHD, SMAD4, SMARCA4, SMARCB1, SMARCE1, STK11, SUFU, TERC, TERT, TMEM127, TP53, TSC1, TSC2, VHL, WRN and WT1.  (7) Oncotype testing on the 11/13/2019 biopsy showed a score of 22, predicting a recurrence outside the breast in the next 9 years of 8% if the patient only systemic therapy was tamoxifen or an aromatase inhibitor for 5 years.  It also predicted no benefit from chemotherapy.  (8) status post right lumpectomy and sentinel lymph node sampling 12/26/2018 148 pT1c pn), stage 1A invasive lobular carcinoma, with negative margins.  (a) a total of 6 axillary lymph nodes were removed.  (9) adjuvant radiation in process  (10) anastrozole to start 03/10/2020  (a) osteoporosis: Consider denosumab/Prolia at the start of anastrozole   PLAN:  Teretha did very well with her surgery and is very pleased with the cosmetic result.  She is going to start radiation next week and the plan is for 4 weeks.  She will be done July 22.  She then will be ready to start antiestrogens.  We discussed the difference between tamoxifen and anastrozole in detail. She understands that anastrozole and the aromatase inhibitors in general work by blocking estrogen production. Accordingly vaginal dryness, decrease in bone density, and of course hot flashes can result. The aromatase inhibitors can also negatively affect the cholesterol profile, although that is a minor effect. One out of 5 women on aromatase inhibitors we will feel "old and achy". This arthralgia/myalgia syndrome, which resembles fibromyalgia clinically, does resolve with stopping the medications. Accordingly this is not a reason to not try an aromatase inhibitor but it is a frequent reason to stop it (in other words 20% of women will not be able to tolerate these medications).  Tamoxifen on the other hand does not block estrogen production. It does not "take away a woman's estrogen". It blocks the estrogen receptor  in breast cells. Like anastrozole, it can also cause hot flashes. As opposed to anastrozole, tamoxifen has many estrogen-like effects. It is technically an estrogen receptor modulator. This means that in some tissues tamoxifen works like estrogen-- for example it helps strengthen the bones. It tends to  improve the cholesterol profile. It can cause thickening of the endometrial lining, and even endometrial polyps or rarely cancer of the uterus.(The risk of uterine cancer due to tamoxifen is one additional cancer per thousand women year). It can cause vaginal wetness or stickiness. It can cause blood clots through this estrogen-like effect--the risk of blood clots with tamoxifen is exactly the same as with birth control pills or hormone replacement.  Neither of these agents causes mood changes or weight gain, despite the popular belief that they can have these side effects. We have data from studies comparing either of these drugs with placebo, and in those cases the control group had the same amount of weight gain and depression as the group that took the drug.  Since she took tamoxifen for 5 years previously I recommended she consider anastrozole this time.  We reviewed her Oncotype results in detail and she understands even though because she had tamoxifen 5 years previously she does not quite fit the population studied in the [facility] nevertheless if she wants to shoot for the 92% chance of not having stage IV disease in the next 9 years she really needs to take an antiestrogen for 5 years.  She is very hesitant to take any medications at all.  She is on multiple supplements.  She is agreeable to give anastrozole a try but took Fosamax for 2 years remotely and at this point really does not want to try anything for the bones.  She is on vitamin D supplementations and we discussed a walking program for her  In short the plan once she finishes radiation is to start anastrozole mid August.  She will  see me mid October and if she is tolerating anastrozole well we will continue it for 5years.  We will follow bone densities closely.  Total encounter time 35 minutes.*  Dreshaun Stene, Krista Dad, MD  01/11/20 11:34 AM Medical Oncology and Hematology [facility] [place], [place] [address] Tel. [phone]    Fax. [phone]   I, Wilburn Mylar, am acting as scribe for Dr. Virgie Butler. Hiroki Wint.  I, Lurline Del MD, have reviewed the above documentation for accuracy and completeness, and I agree with the above.   *Total Encounter Time as defined by the Centers for Medicare and Medicaid Services includes, in addition to the face-to-face time of a patient visit (documented in the note above) non-face-to-face time: obtaining and reviewing outside history, ordering and reviewing medications, tests or procedures, care coordination (communications with other health care professionals or caregivers) and documentation in the medical record.

## 2020-01-10 NOTE — Progress Notes (Signed)
Radiation Oncology         [phone] ________________________________  Name: Krista Butler        MRN: [id number]  Date of Service: 01/10/2020 DOB: 11-15-1950  FM:BWGYK, Annie Main, MD  Magrinat, [name], MD     REFERRING PHYSICIAN: Magrinat, [name], MD   DIAGNOSIS: The encounter diagnosis was Malignant neoplasm of upper-outer quadrant of right breast in female, estrogen receptor positive ([place]).   HISTORY OF PRESENT ILLNESS: Krista Butler is a 69 y.o. female seen  for a new diagnosis of right breast cancer. The patient was noted to have a history of a left breast cancer in 2014.  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  She also took tamoxifen for 5 years.  More recently however she noted a palpable mass in the right breast, diagnostic imaging revealed a 2.5 x 2.4 x 1.6 cm mass that abuts the muscle of the pectoralis, and her axilla was negative.  The lesion was seen at 10:00.  An MRI was performed as well to evaluate for the muscular concerns which revealed the mass measuring 2.2 x 1.8 x 1.6 cm.  A biopsy on 11/13/2019 revealed an ER/PR positive HER-2 negative invasive lobular carcinoma with a Ki-67 of 20%, the grade was felt to be a grade 2.  Oncotype testing on her biopsy was 22 and she would not receive any systemic therapy. She subsequently underwent right breast lumpectomy and sentinel lymph node biopsy on 12/26/2019.  Final pathology reveals a grade 2 invasive lobular carcinoma, her margins were negative for cancer, there was associated LCIS, and the tumor overall measured 2 cm.  She had 6 sampled lymph nodes all of which were negative for carcinoma, and her tumor was ER/PR positive, HER-2 negative with a Ki-67 of 20%.    PREVIOUS RADIATION THERAPY: Yes   12/25/12 - 02/08/13 The patient was initially treated to 50 Gy at 2 Gy per fraction using whole-breast tangent fields. The patient then received a 14.4 Gy boost to the seroma cavity using an en face  electron field. This utilized 12 MeV electorns. The total dose was 64.4 Gy.  PAST MEDICAL HISTORY:  Past Medical History:  Diagnosis Date  . Allergy    medicated for this  . Arm fracture    LEFT arm; as a child  . Breast cancer ([place])    left  . Diverticulosis   . Family history of breast cancer   . Family history of kidney cancer   . GERD (gastroesophageal reflux disease)   . History of radiation therapy 12/25/12-02/08/13   64.4 Gy to left breast  . Osteoporosis   . Personal history of radiation therapy   . Thyroid disease   . Wears glasses        PAST SURGICAL HISTORY: Past Surgical History:  Procedure Laterality Date  . BREAST BIOPSY  1997   lt breast cyst  . BREAST LUMPECTOMY Left 2014  . BREAST LUMPECTOMY WITH NEEDLE LOCALIZATION Left 11/23/2012   Procedure: BREAST LUMPECTOMY WITH NEEDLE LOCALIZATION;  Surgeon: Rolm Bookbinder, MD;  Location: [facility];  Service: General;  Laterality: Left;  . BREAST LUMPECTOMY WITH RADIOACTIVE SEED AND SENTINEL LYMPH NODE BIOPSY Right 12/26/2019   Procedure: RIGHT BREAST LUMPECTOMY WITH RADIOACTIVE SEED AND RIGHT AXILLARY SENTINEL LYMPH NODE BIOPSY;  Surgeon: Rolm Bookbinder, MD;  Location: [facility];  Service: General;  Laterality: Right;  . COLONOSCOPY    . TONSILLECTOMY    .  UPPER GI ENDOSCOPY  2012     FAMILY HISTORY:  Family History  Problem Relation Age of Onset  . Heart disease Mother   . Stroke Father   . Stroke Maternal Grandmother   . Stroke Maternal Grandfather   . Heart Problems Paternal Grandfather   . Breast cancer Cousin        dx. younger than 65 (paternal cousin)  . Cancer Cousin        dx. in her 65s, unknown type (paternal cousin)  . Kidney cancer Cousin 62       (maternal cousin)  . Other Cousin        genetic testing - unknown results (maternal cousin)  . Colon cancer Neg Hx   . Esophageal cancer Neg Hx   . Pancreatic cancer Neg Hx   . Stomach cancer Neg Hx   . Liver  disease Neg Hx      SOCIAL HISTORY:  reports that she has never smoked. She has never used smokeless tobacco. She reports current alcohol use of about 2.0 standard drinks of alcohol per week. She reports that she does not use drugs. The patient is married and lives in [place], [place].    ALLERGIES: Sulfa antibiotics and Percocet [oxycodone-acetaminophen]   MEDICATIONS:  Current Outpatient Medications  Medication Sig Dispense Refill  . Biotin 1000 MCG tablet Take 1,000 mcg by mouth daily.    Marland Kitchen levothyroxine (SYNTHROID, LEVOTHROID) 175 MCG tablet Take 175 mcg by mouth daily before breakfast.    . Probiotic Product (PROBIOTIC DAILY) CAPS Take 1 capsule by mouth daily.    . traMADol (ULTRAM) 50 MG tablet Take 2 tablets (100 mg total) by mouth every 6 (six) hours as needed. 10 tablet 0  . UNABLE TO FIND Patient takes numerous supplements and OTCs including CoQ10, vit C, loratadine, pre-biotic poweder, collagen syrup    . zinc gluconate 50 MG tablet Take 50 mg by mouth daily. 30 mg     No current facility-administered medications for this encounter.     REVIEW OF SYSTEMS: On review of systems, the patient reports that she is doing well overall. She reports that since her surgery she has been healing up nicely, she reports no concerns about her incision lines. But is noticed improvement in some neuropathic symptoms in the axilla.  No other complaints are verbalized.     PHYSICAL EXAM:  Unable to assess vital signs given encounter type In general this is a well appearing caucasian female in no acute distress. She's alert and oriented x4 and appropriate throughout the examination. Cardiopulmonary assessment is negative for acute distress and she exhibits normal effort. Bilateral breast exam is deferred.    ECOG = 0  0 - Asymptomatic (Fully active, able to carry on all predisease activities without restriction)  1 - Symptomatic but completely ambulatory (Restricted in physically strenuous  activity but ambulatory and able to carry out work of a light or sedentary nature. For example, light housework, office work)  2 - Symptomatic, <50% in bed during the day (Ambulatory and capable of all self care but unable to carry out any work activities. Up and about more than 50% of waking hours)  3 - Symptomatic, >50% in bed, but not bedbound (Capable of only limited self-care, confined to bed or chair 50% or more of waking hours)  4 - Bedbound (Completely disabled. Cannot carry on any self-care. Totally confined to bed or chair)  5 - Death   Eustace Pen MM, Creech RH, [place],  et al. [phone]). "Toxicity and response criteria of the [facility]". [place] Oncol. 5 (6): 649-55    LABORATORY DATA:  Lab Results  Component Value Date   WBC 4.8 11/26/2019   HGB 14.4 11/26/2019   HCT 43.9 11/26/2019   MCV 94.0 11/26/2019   PLT 251 11/26/2019   Lab Results  Component Value Date   NA 142 11/26/2019   K 4.4 11/26/2019   CL 103 11/26/2019   CO2 28 11/26/2019   Lab Results  Component Value Date   ALT 30 11/26/2019   AST 27 11/26/2019   ALKPHOS 114 11/26/2019   BILITOT 0.6 11/26/2019      RADIOGRAPHY: NM Sentinel Node Inj-No Rpt (Breast)  Result Date: 12/26/2019 Sulfur colloid was injected by the nuclear medicine technologist for melanoma sentinel node.   MM Breast Surgical Specimen  Result Date: 12/26/2019 CLINICAL DATA:  Status post RIGHT lumpectomy following radioactive seed localization. EXAM: SPECIMEN RADIOGRAPH OF THE RIGHT BREAST COMPARISON:  Previous exam(s). FINDINGS: Status post excision of the right breast. The radioactive seed and ribbon shaped biopsy marker clip are present, completely intact, and were marked for pathology. IMPRESSION: Specimen radiograph of the RIGHT breast. Electronically Signed   By: Nolon Nations M.D.   On: 12/26/2019 14:56   [place] RT RADIOACTIVE SEED LOC  Result Date: 12/25/2019 CLINICAL DATA:  Patient presents for seed  localization of the RIGHT breast. Recent ultrasound-guided core biopsy shows grade 2 invasive mammary carcinoma in the 10 o'clock location of the RIGHT breast. EXAM: ULTRASOUND GUIDED RADIOACTIVE SEED LOCALIZATION OF THE RIGHT BREAST COMPARISON:  Previous exam(s). FINDINGS: Patient presents for radioactive seed localization prior to lumpectomy. I met with the patient and we discussed the procedure of seed localization including benefits and alternatives. We discussed the high likelihood of a successful procedure. We discussed the risks of the procedure including infection, bleeding, tissue injury and further surgery. We discussed the low dose of radioactivity involved in the procedure. Informed, written consent was given. The usual time-out protocol was performed immediately prior to the procedure. Using ultrasound guidance, sterile technique, 1% lidocaine and an I-125 radioactive seed, the ribbon shaped clip within the mass in the 10 o'clock location of the RIGHT breast was localized using a inferior to superior approach. The follow-up mammogram images confirm the seed in the expected location and were marked for Dr. Donne Hazel. Follow-up survey of the patient confirms presence of the radioactive seed. Order number of I-125 seed:  [id number]. Total activity:  4.081 millicuries reference Date: 12/20/2019 The patient tolerated the procedure well and was released from the [facility]. She was given instructions regarding seed removal. IMPRESSION: Radioactive seed localization right breast. No apparent complications. Electronically Signed   By: Nolon Nations M.D.   On: 12/25/2019 16:23   MM CLIP PLACEMENT RIGHT  Result Date: 12/25/2019 CLINICAL DATA:  Status post ultrasound-guided placement of radioactive seed the 10 o'clock location of the RIGHT breast. EXAM: DIAGNOSTIC RIGHT MAMMOGRAM POST ULTRASOUND-GUIDED RADIOACTIVE SEED PLACEMENT COMPARISON:  Previous exam(s). FINDINGS: Mammographic images were obtained  following ultrasound-guided radioactive seed placement. These demonstrate the radioactive seed immediately adjacent to the ribbon shaped clip in the UPPER-OUTER QUADRANT of the RIGHT breast, as expected. IMPRESSION: Appropriate location of the radioactive seed. Final Assessment: Post Procedure Mammograms for Seed Placement Electronically Signed   By: Nolon Nations M.D.   On: 12/25/2019 16:24       IMPRESSION/PLAN: 1. 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 Dr. Lisbeth Renshaw discusses the pathology findings and reviews the nature of right breast disease. The consensus from the breast conference includes external radiotherapy to the breast followed by antiestrogen therapy. Her oncotype score was low risk so she will not received chemotherapy. We discussed the risks, benefits, short, and long term effects of radiotherapy, and the patient is interested in proceeding. Dr. Lisbeth Renshaw discusses the delivery and logistics of radiotherapy and recommends a course of 4 weeks of radiotherapy. She will simulate tomorrow and start treatment on 01/21/20. She is trying to finish treatment by 02/14/20 due to summer vacation which we will try to accommodate. 2. History of High Grade, ER/PR positive DCIS of the left breast. This will be followed expectantly in surveillance.  This encounter was provided by telemedicine platform MyChart.  The patient has provided two factor identification and has given verbal consent for this type of encounter and has been advised to only accept a meeting of this type in a secure network environment. The time spent during this encounter was 60 minutes including preparation, discussion, and coordination of the patient's care. The attendants for this meeting include Dr. Lisbeth Renshaw, Hayden Pedro  and Sharin Grave.  During the encounter, Dr. Lisbeth Renshaw, and Hayden Pedro were located at [facility] Radiation Oncology Department.  ALEYSSA PIKE was located at home.    The above documentation reflects my direct findings during this shared patient visit. Please see the separate note by Dr. Lisbeth Renshaw on this date for the remainder of the patient's plan of care.    Carola Rhine, PAC

## 2020-01-11 ENCOUNTER — Other Ambulatory Visit: Payer: Self-pay

## 2020-01-11 ENCOUNTER — Inpatient Hospital Stay: Payer: Medicare PPO | Attending: Oncology | Admitting: Oncology

## 2020-01-11 ENCOUNTER — Telehealth: Payer: Self-pay | Admitting: Oncology

## 2020-01-11 ENCOUNTER — Ambulatory Visit
Admission: RE | Admit: 2020-01-11 | Discharge: 2020-01-11 | Disposition: A | Discharge status: Home / Self Care | Payer: Medicare PPO | Source: Ambulatory Visit | Attending: Radiation Oncology | Admitting: Radiation Oncology

## 2020-01-11 VITALS — BP 104/58 | HR 62 | Temp 97.4°F | Resp 18 | Ht 67.0 in | Wt 145.6 lb

## 2020-01-11 DIAGNOSIS — Z809 Family history of malignant neoplasm, unspecified: Secondary | ICD-10-CM | POA: Diagnosis not present

## 2020-01-11 DIAGNOSIS — Z823 Family history of stroke: Secondary | ICD-10-CM | POA: Insufficient documentation

## 2020-01-11 DIAGNOSIS — Z79811 Long term (current) use of aromatase inhibitors: Secondary | ICD-10-CM | POA: Diagnosis not present

## 2020-01-11 DIAGNOSIS — Z170 Estrogen receptor positive status [ER+]: Secondary | ICD-10-CM | POA: Diagnosis not present

## 2020-01-11 DIAGNOSIS — N644 Mastodynia: Secondary | ICD-10-CM | POA: Insufficient documentation

## 2020-01-11 DIAGNOSIS — M2550 Pain in unspecified joint: Secondary | ICD-10-CM | POA: Diagnosis not present

## 2020-01-11 DIAGNOSIS — Z8249 Family history of ischemic heart disease and other diseases of the circulatory system: Secondary | ICD-10-CM | POA: Diagnosis not present

## 2020-01-11 DIAGNOSIS — M797 Fibromyalgia: Secondary | ICD-10-CM | POA: Insufficient documentation

## 2020-01-11 DIAGNOSIS — Z510 Encounter for antineoplastic radiation therapy: Secondary | ICD-10-CM | POA: Insufficient documentation

## 2020-01-11 DIAGNOSIS — Z79899 Other long term (current) drug therapy: Secondary | ICD-10-CM | POA: Insufficient documentation

## 2020-01-11 DIAGNOSIS — Z803 Family history of malignant neoplasm of breast: Secondary | ICD-10-CM | POA: Insufficient documentation

## 2020-01-11 DIAGNOSIS — C50212 Malignant neoplasm of upper-inner quadrant of left female breast: Secondary | ICD-10-CM

## 2020-01-11 DIAGNOSIS — C50411 Malignant neoplasm of upper-outer quadrant of right female breast: Secondary | ICD-10-CM | POA: Insufficient documentation

## 2020-01-11 DIAGNOSIS — Z8051 Family history of malignant neoplasm of kidney: Secondary | ICD-10-CM | POA: Insufficient documentation

## 2020-01-11 MED ORDER — ANASTROZOLE 1 MG PO TABS
1.0000 mg | ORAL_TABLET | Freq: Every day | ORAL | 4 refills | Status: DC
Start: 2020-01-11 — End: 2021-02-16

## 2020-01-11 NOTE — Telephone Encounter (Signed)
Scheduled appt per 6/18 los. Pt confirmed appt date and time.

## 2020-01-14 ENCOUNTER — Encounter: Payer: Self-pay | Admitting: *Deleted

## 2020-01-16 DIAGNOSIS — Z510 Encounter for antineoplastic radiation therapy: Secondary | ICD-10-CM | POA: Diagnosis not present

## 2020-01-21 ENCOUNTER — Other Ambulatory Visit: Payer: Self-pay

## 2020-01-21 ENCOUNTER — Ambulatory Visit
Admission: RE | Admit: 2020-01-21 | Discharge: 2020-01-21 | Disposition: A | Discharge status: Home / Self Care | Payer: Medicare PPO | Source: Ambulatory Visit | Attending: Radiation Oncology | Admitting: Radiation Oncology

## 2020-01-21 DIAGNOSIS — Z510 Encounter for antineoplastic radiation therapy: Secondary | ICD-10-CM | POA: Diagnosis not present

## 2020-01-22 ENCOUNTER — Ambulatory Visit
Admission: RE | Admit: 2020-01-22 | Discharge: 2020-01-22 | Disposition: A | Discharge status: Home / Self Care | Payer: Medicare PPO | Source: Ambulatory Visit | Attending: Radiation Oncology | Admitting: Radiation Oncology

## 2020-01-22 ENCOUNTER — Other Ambulatory Visit: Payer: Self-pay

## 2020-01-22 DIAGNOSIS — Z510 Encounter for antineoplastic radiation therapy: Secondary | ICD-10-CM | POA: Diagnosis not present

## 2020-01-23 ENCOUNTER — Ambulatory Visit
Admission: RE | Admit: 2020-01-23 | Discharge: 2020-01-23 | Disposition: A | Discharge status: Home / Self Care | Payer: Medicare PPO | Source: Ambulatory Visit | Attending: Radiation Oncology | Admitting: Radiation Oncology

## 2020-01-23 ENCOUNTER — Other Ambulatory Visit: Payer: Self-pay

## 2020-01-23 DIAGNOSIS — Z803 Family history of malignant neoplasm of breast: Secondary | ICD-10-CM

## 2020-01-23 DIAGNOSIS — Z510 Encounter for antineoplastic radiation therapy: Secondary | ICD-10-CM | POA: Diagnosis not present

## 2020-01-23 DIAGNOSIS — C50411 Malignant neoplasm of upper-outer quadrant of right female breast: Secondary | ICD-10-CM

## 2020-01-23 MED ORDER — ALRA NON-METALLIC DEODORANT (RAD-ONC)
1.0000 "application " | Freq: Once | TOPICAL | Status: AC
  Administered 2020-01-23: 1 via TOPICAL

## 2020-01-23 MED ORDER — SONAFINE EX EMUL
1.0000 "application " | Freq: Once | CUTANEOUS | Status: AC
  Administered 2020-01-23: 1 via TOPICAL

## 2020-01-23 NOTE — Progress Notes (Signed)
Pt here for patient teaching.  Pt given Radiation and You booklet, skin care instructions, Alra deodorant and Sonafine.  Reviewed areas of pertinence such as fatigue, hair loss, skin changes, breast tenderness and breast swelling . Pt able to give teach back of to pat skin and use unscented/gentle soap,apply Sonafine bid, avoid applying anything to skin within 4 hours of treatment, avoid wearing an under wire bra and to use an electric razor if they must shave. Pt verbalizes understanding of information given and will contact nursing with any questions or concerns.     Vishwa Dais M. Haiden Rawlinson RN, BSN             

## 2020-01-24 ENCOUNTER — Other Ambulatory Visit: Payer: Self-pay

## 2020-01-24 ENCOUNTER — Ambulatory Visit
Admission: RE | Admit: 2020-01-24 | Discharge: 2020-01-24 | Disposition: A | Discharge status: Home / Self Care | Payer: Medicare PPO | Source: Ambulatory Visit | Attending: Radiation Oncology | Admitting: Radiation Oncology

## 2020-01-24 DIAGNOSIS — C50411 Malignant neoplasm of upper-outer quadrant of right female breast: Secondary | ICD-10-CM | POA: Insufficient documentation

## 2020-01-24 DIAGNOSIS — Z170 Estrogen receptor positive status [ER+]: Secondary | ICD-10-CM | POA: Diagnosis not present

## 2020-01-24 DIAGNOSIS — Z510 Encounter for antineoplastic radiation therapy: Secondary | ICD-10-CM | POA: Diagnosis present

## 2020-01-25 ENCOUNTER — Ambulatory Visit
Admission: RE | Admit: 2020-01-25 | Discharge: 2020-01-25 | Disposition: A | Discharge status: Home / Self Care | Payer: Medicare PPO | Source: Ambulatory Visit | Attending: Radiation Oncology | Admitting: Radiation Oncology

## 2020-01-25 ENCOUNTER — Other Ambulatory Visit: Payer: Self-pay

## 2020-01-25 DIAGNOSIS — Z510 Encounter for antineoplastic radiation therapy: Secondary | ICD-10-CM | POA: Diagnosis not present

## 2020-01-29 ENCOUNTER — Ambulatory Visit
Admission: RE | Admit: 2020-01-29 | Discharge: 2020-01-29 | Disposition: A | Discharge status: Home / Self Care | Payer: Medicare PPO | Source: Ambulatory Visit | Attending: Radiation Oncology | Admitting: Radiation Oncology

## 2020-01-29 ENCOUNTER — Other Ambulatory Visit: Payer: Self-pay

## 2020-01-29 DIAGNOSIS — Z510 Encounter for antineoplastic radiation therapy: Secondary | ICD-10-CM | POA: Diagnosis not present

## 2020-01-30 ENCOUNTER — Other Ambulatory Visit: Payer: Self-pay

## 2020-01-30 ENCOUNTER — Ambulatory Visit
Admission: RE | Admit: 2020-01-30 | Discharge: 2020-01-30 | Disposition: A | Discharge status: Home / Self Care | Payer: Medicare PPO | Source: Ambulatory Visit | Attending: Radiation Oncology | Admitting: Radiation Oncology

## 2020-01-30 DIAGNOSIS — Z510 Encounter for antineoplastic radiation therapy: Secondary | ICD-10-CM | POA: Diagnosis not present

## 2020-01-31 ENCOUNTER — Ambulatory Visit
Admission: RE | Admit: 2020-01-31 | Discharge: 2020-01-31 | Disposition: A | Discharge status: Home / Self Care | Payer: Medicare PPO | Source: Ambulatory Visit | Attending: Radiation Oncology | Admitting: Radiation Oncology

## 2020-01-31 DIAGNOSIS — Z510 Encounter for antineoplastic radiation therapy: Secondary | ICD-10-CM | POA: Diagnosis not present

## 2020-02-01 ENCOUNTER — Other Ambulatory Visit: Payer: Self-pay

## 2020-02-01 ENCOUNTER — Ambulatory Visit
Admission: RE | Admit: 2020-02-01 | Discharge: 2020-02-01 | Disposition: A | Discharge status: Home / Self Care | Payer: Medicare PPO | Source: Ambulatory Visit | Attending: Radiation Oncology | Admitting: Radiation Oncology

## 2020-02-01 DIAGNOSIS — Z510 Encounter for antineoplastic radiation therapy: Secondary | ICD-10-CM | POA: Diagnosis not present

## 2020-02-01 DIAGNOSIS — Z170 Estrogen receptor positive status [ER+]: Secondary | ICD-10-CM

## 2020-02-01 MED ORDER — RADIAPLEXRX EX GEL: Freq: Once | CUTANEOUS | Status: AC

## 2020-02-04 ENCOUNTER — Ambulatory Visit
Admission: RE | Admit: 2020-02-04 | Discharge: 2020-02-04 | Disposition: A | Discharge status: Home / Self Care | Payer: Medicare PPO | Source: Ambulatory Visit | Attending: Radiation Oncology | Admitting: Radiation Oncology

## 2020-02-04 ENCOUNTER — Other Ambulatory Visit: Payer: Self-pay

## 2020-02-04 DIAGNOSIS — Z510 Encounter for antineoplastic radiation therapy: Secondary | ICD-10-CM | POA: Diagnosis not present

## 2020-02-05 ENCOUNTER — Ambulatory Visit
Admission: RE | Admit: 2020-02-05 | Discharge: 2020-02-05 | Disposition: A | Discharge status: Home / Self Care | Payer: Medicare PPO | Source: Ambulatory Visit | Attending: Radiation Oncology | Admitting: Radiation Oncology

## 2020-02-05 DIAGNOSIS — Z510 Encounter for antineoplastic radiation therapy: Secondary | ICD-10-CM | POA: Diagnosis not present

## 2020-02-06 ENCOUNTER — Other Ambulatory Visit: Payer: Self-pay

## 2020-02-06 ENCOUNTER — Ambulatory Visit
Admission: RE | Admit: 2020-02-06 | Discharge: 2020-02-06 | Disposition: A | Discharge status: Home / Self Care | Payer: Medicare PPO | Source: Ambulatory Visit | Attending: Radiation Oncology | Admitting: Radiation Oncology

## 2020-02-06 DIAGNOSIS — Z510 Encounter for antineoplastic radiation therapy: Secondary | ICD-10-CM | POA: Diagnosis not present

## 2020-02-07 ENCOUNTER — Other Ambulatory Visit: Payer: Self-pay

## 2020-02-07 ENCOUNTER — Ambulatory Visit
Admission: RE | Admit: 2020-02-07 | Discharge: 2020-02-07 | Disposition: A | Discharge status: Home / Self Care | Payer: Medicare PPO | Source: Ambulatory Visit | Attending: Radiation Oncology | Admitting: Radiation Oncology

## 2020-02-07 DIAGNOSIS — Z510 Encounter for antineoplastic radiation therapy: Secondary | ICD-10-CM | POA: Diagnosis not present

## 2020-02-08 ENCOUNTER — Ambulatory Visit
Admission: RE | Admit: 2020-02-08 | Discharge: 2020-02-08 | Disposition: A | Discharge status: Home / Self Care | Payer: Medicare PPO | Source: Ambulatory Visit | Attending: Radiation Oncology | Admitting: Radiation Oncology

## 2020-02-08 ENCOUNTER — Other Ambulatory Visit: Payer: Self-pay

## 2020-02-08 ENCOUNTER — Ambulatory Visit: Payer: Medicare PPO

## 2020-02-08 DIAGNOSIS — Z510 Encounter for antineoplastic radiation therapy: Secondary | ICD-10-CM | POA: Diagnosis not present

## 2020-02-11 ENCOUNTER — Ambulatory Visit
Admission: RE | Admit: 2020-02-11 | Discharge: 2020-02-11 | Disposition: A | Discharge status: Home / Self Care | Payer: Medicare PPO | Source: Ambulatory Visit | Attending: Radiation Oncology | Admitting: Radiation Oncology

## 2020-02-11 ENCOUNTER — Other Ambulatory Visit: Payer: Self-pay

## 2020-02-11 DIAGNOSIS — Z510 Encounter for antineoplastic radiation therapy: Secondary | ICD-10-CM | POA: Diagnosis not present

## 2020-02-12 ENCOUNTER — Other Ambulatory Visit: Payer: Self-pay

## 2020-02-12 ENCOUNTER — Ambulatory Visit: Payer: Medicare PPO

## 2020-02-12 ENCOUNTER — Ambulatory Visit
Admission: RE | Admit: 2020-02-12 | Discharge: 2020-02-12 | Disposition: A | Discharge status: Home / Self Care | Payer: Medicare PPO | Source: Ambulatory Visit | Attending: Radiation Oncology | Admitting: Radiation Oncology

## 2020-02-12 ENCOUNTER — Encounter: Payer: Self-pay | Admitting: *Deleted

## 2020-02-12 DIAGNOSIS — Z510 Encounter for antineoplastic radiation therapy: Secondary | ICD-10-CM | POA: Diagnosis not present

## 2020-02-13 ENCOUNTER — Ambulatory Visit
Admission: RE | Admit: 2020-02-13 | Discharge: 2020-02-13 | Disposition: A | Discharge status: Home / Self Care | Payer: Medicare PPO | Source: Ambulatory Visit | Attending: Radiation Oncology | Admitting: Radiation Oncology

## 2020-02-13 ENCOUNTER — Ambulatory Visit: Payer: Medicare PPO

## 2020-02-13 ENCOUNTER — Other Ambulatory Visit: Payer: Self-pay

## 2020-02-13 DIAGNOSIS — Z510 Encounter for antineoplastic radiation therapy: Secondary | ICD-10-CM | POA: Diagnosis not present

## 2020-02-14 ENCOUNTER — Other Ambulatory Visit: Payer: Self-pay

## 2020-02-14 ENCOUNTER — Ambulatory Visit
Admission: RE | Admit: 2020-02-14 | Discharge: 2020-02-14 | Disposition: A | Discharge status: Home / Self Care | Payer: Medicare PPO | Source: Ambulatory Visit | Attending: Radiation Oncology | Admitting: Radiation Oncology

## 2020-02-14 ENCOUNTER — Encounter: Payer: Self-pay | Admitting: Radiation Oncology

## 2020-02-14 ENCOUNTER — Ambulatory Visit: Payer: Medicare PPO

## 2020-02-14 DIAGNOSIS — Z510 Encounter for antineoplastic radiation therapy: Secondary | ICD-10-CM | POA: Diagnosis not present

## 2020-02-15 ENCOUNTER — Ambulatory Visit: Payer: Medicare PPO

## 2020-02-18 ENCOUNTER — Ambulatory Visit: Payer: Medicare PPO

## 2020-02-25 ENCOUNTER — Ambulatory Visit: Payer: Medicare PPO | Admitting: Physical Therapy

## 2020-02-26 ENCOUNTER — Encounter (HOSPITAL_BASED_OUTPATIENT_CLINIC_OR_DEPARTMENT_OTHER): Payer: Self-pay

## 2020-02-26 ENCOUNTER — Ambulatory Visit (HOSPITAL_COMMUNITY)
Admission: EM | Admit: 2020-02-26 | Discharge: 2020-02-26 | Discharge status: Against Medical Advice | Payer: Medicare PPO

## 2020-02-26 ENCOUNTER — Other Ambulatory Visit: Payer: Self-pay

## 2020-02-26 ENCOUNTER — Emergency Department (HOSPITAL_BASED_OUTPATIENT_CLINIC_OR_DEPARTMENT_OTHER)
Admission: EM | Admit: 2020-02-26 | Discharge: 2020-02-26 | Disposition: A | Discharge status: Home / Self Care | Payer: Medicare PPO | Attending: Emergency Medicine | Admitting: Emergency Medicine

## 2020-02-26 DIAGNOSIS — R000 Tachycardia, unspecified: Secondary | ICD-10-CM | POA: Insufficient documentation

## 2020-02-26 DIAGNOSIS — L509 Urticaria, unspecified: Secondary | ICD-10-CM | POA: Diagnosis present

## 2020-02-26 MED ORDER — METHYLPREDNISOLONE SODIUM SUCC 125 MG IJ SOLR
125.0000 mg | Freq: Once | INTRAMUSCULAR | Status: AC
  Administered 2020-02-26: 125 mg via INTRAVENOUS
  Filled 2020-02-26: qty 2

## 2020-02-26 MED ORDER — FAMOTIDINE IN NACL 20-0.9 MG/50ML-% IV SOLN
20.0000 mg | Freq: Once | INTRAVENOUS | Status: AC
  Administered 2020-02-26: 20 mg via INTRAVENOUS
  Filled 2020-02-26: qty 50

## 2020-02-26 MED ORDER — PREDNISONE 10 MG (21) PO TBPK
ORAL_TABLET | Freq: Every day | ORAL | 0 refills | Status: DC
Start: 2020-02-26 — End: 2020-12-15

## 2020-02-26 MED ORDER — SODIUM CHLORIDE 0.9 % IV BOLUS
500.0000 mL | Freq: Once | INTRAVENOUS | Status: AC
  Administered 2020-02-26: 500 mL via INTRAVENOUS

## 2020-02-26 NOTE — ED Provider Notes (Signed)
[facility] EMERGENCY DEPARTMENT Provider Note   CSN: [id number] Arrival date & time: 02/26/20  1655     History Chief Complaint  Patient presents with  . Urticaria  . Shortness of Breath    Krista Butler is a 69 y.o. female.  The history is provided by the patient.  Urticaria This is a new problem. Episode onset: 3 days. The problem occurs constantly. The problem has been gradually worsening. Pertinent negatives include no chest pain, no abdominal pain, no headaches and no shortness of breath. Nothing aggravates the symptoms. Relieved by: benadryl, hydrocortisone cream. The treatment provided mild relief.       Past Medical History:  Diagnosis Date  . Allergy    medicated for this  . Arm fracture    LEFT arm; as a child  . Breast cancer ([place])    left  . Diverticulosis   . Family history of breast cancer   . Family history of kidney cancer   . GERD (gastroesophageal reflux disease)   . History of radiation therapy 12/25/12-02/08/13   64.4 Gy to left breast  . Osteoporosis   . Personal history of radiation therapy   . Thyroid disease   . Wears glasses     Patient Active Problem List   Diagnosis Date Noted  . Genetic testing 12/05/2019  . Family history of breast cancer   . Family history of kidney cancer   . Malignant neoplasm of upper-outer quadrant of right breast in female, estrogen receptor positive ([place]) 11/21/2019  . Malignant neoplasm of upper-inner quadrant of left breast in female, estrogen receptor positive ([place]) 11/09/2012  . DIVERTICULOSIS-COLON 02/02/2010  . DYSPHAGIA UNSPECIFIED 02/02/2010  . DYSPHAGIA 02/02/2010    Past Surgical History:  Procedure Laterality Date  . BREAST BIOPSY  1997   lt breast cyst  . BREAST LUMPECTOMY Left 2014  . BREAST LUMPECTOMY WITH NEEDLE LOCALIZATION Left 11/23/2012   Procedure: BREAST LUMPECTOMY WITH NEEDLE LOCALIZATION;  Surgeon: Rolm Bookbinder, MD;  Location: [facility];  Service:  General;  Laterality: Left;  . BREAST LUMPECTOMY WITH RADIOACTIVE SEED AND SENTINEL LYMPH NODE BIOPSY Right 12/26/2019   Procedure: RIGHT BREAST LUMPECTOMY WITH RADIOACTIVE SEED AND RIGHT AXILLARY SENTINEL LYMPH NODE BIOPSY;  Surgeon: Rolm Bookbinder, MD;  Location: [facility];  Service: General;  Laterality: Right;  . COLONOSCOPY    . TONSILLECTOMY    . UPPER GI ENDOSCOPY  2012     OB History   No obstetric history on file.    Obstetric Comments  47 at age of first period Less than a year use of bcp Premarin use for less than a year.        Family History  Problem Relation Age of Onset  . Heart disease Mother   . Stroke Father   . Stroke Maternal Grandmother   . Stroke Maternal Grandfather   . Heart Problems Paternal Grandfather   . Breast cancer Cousin        dx. younger than 93 (paternal cousin)  . Cancer Cousin        dx. in her 51s, unknown type (paternal cousin)  . Kidney cancer Cousin 67       (maternal cousin)  . Other Cousin        genetic testing - unknown results (maternal cousin)  . Colon cancer Neg Hx   . Esophageal cancer Neg Hx   . Pancreatic cancer Neg Hx   . Stomach cancer Neg Hx   .  Liver disease Neg Hx     Social History   Tobacco Use  . Smoking status: Never Smoker  . Smokeless tobacco: Never Used  Vaping Use  . Vaping Use: Never used  Substance Use Topics  . Alcohol use: Yes    Alcohol/week: 2.0 standard drinks    Types: 2 Glasses of wine per week    Comment: occ  . Drug use: No    Home Medications Prior to Admission medications   Medication Sig Start Date End Date Taking? Authorizing Provider  anastrozole (ARIMIDEX) 1 MG tablet Take 1 tablet (1 mg total) by mouth daily. Start March 10, 2020 01/11/20   Magrinat, [name], MD  Biotin 1000 MCG tablet Take 1,000 mcg by mouth daily.    [provider]  levothyroxine (SYNTHROID, LEVOTHROID) 175 MCG tablet Take 175 mcg by mouth daily before breakfast.    [provider]  predniSONE (STERAPRED UNI-PAK 21 TAB) 10 MG (21) TBPK tablet Take by mouth daily. Take 6 tabs by mouth daily  for 2 days, then 5 tabs for 2 days, then 4 tabs for 2 days, then 3 tabs for 2 days, 2 tabs for 2 days, then 1 tab by mouth daily for 2 days 02/26/20   Lennice Sites, DO  Probiotic Product (PROBIOTIC DAILY) CAPS Take 1 capsule by mouth daily.    [provider]  traMADol (ULTRAM) 50 MG tablet Take 2 tablets (100 mg total) by mouth every 6 (six) hours as needed. 12/26/19   Rolm Bookbinder, MD  UNABLE TO FIND Patient takes numerous supplements and OTCs including CoQ10, vit C, loratadine, pre-biotic poweder, collagen syrup 11/26/19   Magrinat, [name], MD  zinc gluconate 50 MG tablet Take 50 mg by mouth daily. 30 mg    [provider]    Allergies    Sulfa antibiotics and Percocet [oxycodone-acetaminophen]  Review of Systems   Review of Systems  Constitutional: Negative for chills and fever.  HENT: Negative for ear pain and sore throat.   Eyes: Negative for pain and visual disturbance.  Respiratory: Negative for cough and shortness of breath.   Cardiovascular: Negative for chest pain and palpitations.  Gastrointestinal: Negative for abdominal pain and vomiting.  Genitourinary: Negative for dysuria and hematuria.  Musculoskeletal: Negative for arthralgias and back pain.  Skin: Positive for rash. Negative for color change.  Neurological: Negative for seizures, syncope and headaches.  All other systems reviewed and are negative.   Physical Exam Updated Vital Signs  ED Triage Vitals  Enc Vitals Group     BP 02/26/20 1712 115/87     Pulse Rate 02/26/20 1712 (!) 127     Resp 02/26/20 1712 18     Temp 02/26/20 1712 98.5 F (36.9 C)     Temp Source 02/26/20 1712 Oral     SpO2 02/26/20 1712 100 %     Weight 02/26/20 1711 145 lb (65.8 kg)     Height 02/26/20 1711 5\' 6"  (1.676 m)     Head Circumference --      Peak Flow --      Pain Score 02/26/20  1709 0     Pain Loc --      Pain Edu? --      Excl. in [place]? --     Physical Exam Vitals and nursing note reviewed.  Constitutional:      General: She is not in acute distress.    Appearance: She is well-developed. She is not ill-appearing.  HENT:     Head: Normocephalic and atraumatic.     Mouth/Throat:     Mouth: Mucous membranes are moist.  Eyes:     Conjunctiva/sclera: Conjunctivae normal.     Pupils: Pupils are equal, round, and reactive to light.  Cardiovascular:     Rate and Rhythm: Normal rate and regular rhythm.     Pulses: Normal pulses.     Heart sounds: Normal heart sounds. No murmur heard.   Pulmonary:     Effort: Pulmonary effort is normal. No respiratory distress.     Breath sounds: Normal breath sounds. No decreased breath sounds, wheezing, rhonchi or rales.  Abdominal:     Palpations: Abdomen is soft.     Tenderness: There is no abdominal tenderness.  Musculoskeletal:     Cervical back: Normal range of motion and neck supple.  Skin:    General: Skin is warm and dry.     Capillary Refill: Capillary refill takes less than 2 seconds.     Findings: Rash present.     Comments: Hives throughout torso, legs, some areas appear to be improving  Neurological:     Mental Status: She is alert.     ED Results / Procedures / Treatments   Labs (all labs ordered are listed, but only abnormal results are displayed) Labs Reviewed - No data to display  EKG EKG Interpretation  Date/Time:  Tuesday February 26 2020 17:22:33 EDT Ventricular Rate:  115 PR Interval:    QRS Duration: 89 QT Interval:  348 QTC Calculation: 482 R Axis:   77 Text Interpretation: Sinus tachycardia Baseline wander in lead(s) V3 Confirmed by Lennice Sites [phone]) on 02/26/2020 5:47:39 PM   Radiology No results found.  Procedures Procedures (including critical care time)  Medications Ordered in ED Medications  sodium chloride 0.9 % bolus 500 mL (500 mLs Intravenous New Bag/Given 02/26/20  1745)  methylPREDNISolone sodium succinate (SOLU-MEDROL) 125 mg/2 mL injection 125 mg (125 mg Intravenous Given 02/26/20 1745)  famotidine (PEPCID) IVPB 20 mg premix (20 mg Intravenous New Bag/Given 02/26/20 1749)    ED Course  I have reviewed the triage vital signs and the nursing notes.  Pertinent labs & imaging results that were available during my care of the patient were reviewed by me and considered in my medical decision making (see chart for details).    MDM Rules/Calculators/A&P                          SAKEENA TEALL is a 68 year old female with history of breast cancer, allergies who presents the ED with hives.  Patient started new herbal medicine on Sunday and then developed hives shortly afterwards.  Has not taken medication since.  Has been using Benadryl intermittently but continues to have some hives.  Some areas of the hives have improved.  Has been also using some topical hydrocortisone.  Had some diarrhea last night but no longer has any nausea vomiting or abdominal pain.  No real shortness of breath but she feels a little anxious.  She appears to have fairly clear breath sounds on exam.  Normal room air oxygenation.  There is no swelling of the tongue or lips.  No stridor.  Will give IV fluids, steroids, Pepcid.  She already took Benadryl.  No concern for anaphylaxis.  Patient had great improvement of rash following steroids and Pepcid.  Will prescribe steroid taper.  Understands return precautions.  Discharged in good condition.  This chart  was dictated using voice recognition software.  Despite best efforts to proofread,  errors can occur which can change the documentation meaning.    Final Clinical Impression(s) / ED Diagnoses Final diagnoses:  Hives    Rx / DC Orders ED Discharge Orders         Ordered    predniSONE (STERAPRED UNI-PAK 21 TAB) 10 MG (21) TBPK tablet  Daily     Discontinue  Reprint     02/26/20 1844           Lennice Sites, DO 02/26/20 1845

## 2020-02-26 NOTE — ED Triage Notes (Signed)
Pt c/o scattered hives started 3 days ago-" feeling like a hard time breathing" x 1 hour-NAD-steady gait

## 2020-02-28 ENCOUNTER — Emergency Department (HOSPITAL_COMMUNITY)
Admission: EM | Admit: 2020-02-28 | Discharge: 2020-02-28 | Disposition: A | Discharge status: Home / Self Care | Payer: Medicare PPO | Attending: Emergency Medicine | Admitting: Emergency Medicine

## 2020-02-28 ENCOUNTER — Encounter (HOSPITAL_COMMUNITY): Payer: Self-pay | Admitting: *Deleted

## 2020-02-28 ENCOUNTER — Other Ambulatory Visit: Payer: Self-pay

## 2020-02-28 ENCOUNTER — Emergency Department (HOSPITAL_COMMUNITY): Payer: Medicare PPO

## 2020-02-28 DIAGNOSIS — C50411 Malignant neoplasm of upper-outer quadrant of right female breast: Secondary | ICD-10-CM | POA: Insufficient documentation

## 2020-02-28 DIAGNOSIS — Z853 Personal history of malignant neoplasm of breast: Secondary | ICD-10-CM | POA: Insufficient documentation

## 2020-02-28 DIAGNOSIS — C50212 Malignant neoplasm of upper-inner quadrant of left female breast: Secondary | ICD-10-CM | POA: Diagnosis not present

## 2020-02-28 DIAGNOSIS — R531 Weakness: Secondary | ICD-10-CM | POA: Diagnosis not present

## 2020-02-28 DIAGNOSIS — U071 COVID-19: Secondary | ICD-10-CM | POA: Diagnosis not present

## 2020-02-28 DIAGNOSIS — T782XXA Anaphylactic shock, unspecified, initial encounter: Secondary | ICD-10-CM

## 2020-02-28 DIAGNOSIS — Z923 Personal history of irradiation: Secondary | ICD-10-CM | POA: Insufficient documentation

## 2020-02-28 DIAGNOSIS — T7840XA Allergy, unspecified, initial encounter: Secondary | ICD-10-CM | POA: Diagnosis present

## 2020-02-28 DIAGNOSIS — Z79899 Other long term (current) drug therapy: Secondary | ICD-10-CM | POA: Insufficient documentation

## 2020-02-28 LAB — BASIC METABOLIC PANEL
Anion gap: 10 (ref 5–15)
BUN: 12 mg/dL (ref 8–23)
CO2: 26 mmol/L (ref 22–32)
Calcium: 8.9 mg/dL (ref 8.9–10.3)
Chloride: 103 mmol/L (ref 98–111)
Creatinine, Ser: 0.72 mg/dL (ref 0.44–1.00)
GFR calc Af Amer: >60 mL/min (ref >60)
GFR calc non Af Amer: >60 mL/min (ref >60)
Glucose, Bld: 94 mg/dL (ref 70–99)
Potassium: 3.5 mmol/L (ref 3.5–5.1)
Sodium: 139 mmol/L (ref 135–145)

## 2020-02-28 LAB — URINALYSIS, ROUTINE W REFLEX MICROSCOPIC
Bilirubin Urine: NEGATIVE
Glucose, UA: NEGATIVE mg/dL
Ketones, ur: 5 mg/dL — AB
Nitrite: NEGATIVE
Protein, ur: NEGATIVE mg/dL
Specific Gravity, Urine: 1.006 (ref 1.005–1.030)
pH: 6 (ref 5.0–8.0)

## 2020-02-28 LAB — CBC
HCT: 41.3 % (ref 36.0–46.0)
Hemoglobin: 13.4 g/dL (ref 12.0–15.0)
MCH: 30.3 pg (ref 26.0–34.0)
MCHC: 32.4 g/dL (ref 30.0–36.0)
MCV: 93.4 fL (ref 80.0–100.0)
Platelets: 184 10*3/uL (ref 150–400)
RBC: 4.42 MIL/uL (ref 3.87–5.11)
RDW: 12.2 % (ref 11.5–15.5)
WBC: 8 10*3/uL (ref 4.0–10.5)
nRBC: 0 % (ref 0.0–0.2)

## 2020-02-28 LAB — SARS CORONAVIRUS 2 BY RT PCR (HOSPITAL ORDER, PERFORMED IN ~~LOC~~ HOSPITAL LAB): SARS Coronavirus 2: POSITIVE — AB

## 2020-02-28 MED ORDER — METHYLPREDNISOLONE SODIUM SUCC 125 MG IJ SOLR
125.0000 mg | Freq: Once | INTRAMUSCULAR | Status: AC
  Administered 2020-02-28: 125 mg via INTRAVENOUS
  Filled 2020-02-28: qty 2

## 2020-02-28 MED ORDER — SODIUM CHLORIDE 0.9 % IV BOLUS
1000.0000 mL | Freq: Once | INTRAVENOUS | Status: AC
  Administered 2020-02-28: 1000 mL via INTRAVENOUS

## 2020-02-28 MED ORDER — EPINEPHRINE 0.3 MG/0.3ML IJ SOAJ
INTRAMUSCULAR | Status: AC
  Filled 2020-02-28: qty 0.3

## 2020-02-28 MED ORDER — BENZONATATE 100 MG PO CAPS
100.0000 mg | ORAL_CAPSULE | Freq: Three times a day (TID) | ORAL | 0 refills | Status: DC | PRN
Start: 2020-02-28 — End: 2020-12-15

## 2020-02-28 MED ORDER — SODIUM CHLORIDE 0.9% FLUSH
3.0000 mL | Freq: Once | INTRAVENOUS | Status: AC
  Administered 2020-02-28: 3 mL via INTRAVENOUS

## 2020-02-28 MED ORDER — EPINEPHRINE 0.3 MG/0.3ML IJ SOAJ
0.3000 mg | INTRAMUSCULAR | 0 refills | Status: DC | PRN
Start: 2020-02-28 — End: 2020-12-15

## 2020-02-28 MED ORDER — EPINEPHRINE 0.3 MG/0.3ML IJ SOAJ
0.3000 mg | Freq: Once | INTRAMUSCULAR | Status: AC
  Administered 2020-02-28: 0.3 mg via INTRAMUSCULAR
  Filled 2020-02-28: qty 0.3

## 2020-02-28 NOTE — ED Triage Notes (Signed)
To ED via [facility] for weakness after waking this am. Pt started taking an over the counter immunity booster - started having whelps/hives on Sunday night. Seen at [facility] for same on Tuesday - given IV and meds. Pt is taking prednisone as prescribed by [facility]. Whelps continue to itch. 25mg  Benadryl given enroute by EMS. PIV started by EMS in right [facility]. Pt is alert and oriented. No cp. No sob.

## 2020-02-28 NOTE — Discharge Instructions (Signed)
You have been diagnosed with COVID-19 infection.  Please follow instruction below with appropriate self quarantine.  You were also treated for anaphylactic reaction likely from your supplementation.  You may continue taking prednisone, and Benadryl as needed as previously prescribed.  Take Tessalon Perles as needed for cough.  If you develop worsening of your symptoms (shortness of breath, throat swelling, lightheadedness), do not hesitate to use an EpiPen and return to the ER for further care.

## 2020-02-28 NOTE — ED Provider Notes (Signed)
[facility] EMERGENCY DEPARTMENT Provider Note   CSN: [id number] Arrival date & time: 02/28/20  9767     History Chief Complaint  Patient presents with  . Weakness  . Allergic Reaction    Krista Butler is a 69 y.o. female.  The history is provided by the patient and medical records. No language interpreter was used.  Weakness Allergic Reaction    69 year old female significant history of breast cancers, thyroid disease, GERD, presenting for evaluations of urticaria. 5 days ago patient started a new herbal medication and developed hives shortly afterward. She discontinued the medication and subsequently was seen in the ED 2 days ago for urticaria. She was given Benadryl, prednisone as well as topical hydrocortisone. Symptoms did improve and patient subsequently discharged home with steroid taper.  Last night patient states she felt lightheadedness, weak, throat irritation with increasing cough.  This morning she did not feel any better thus prompting this ER visit.  EMS did arrive, patient received Benadryl, Pepcid, and IV fluid prior to arrival.  She was noted to have low blood pressure on initial EMS exam.  Patient has not had Covid vaccination yet.  She cannot recall any other environmental changes.  She does not complain of tongue swelling, wheezing, abdominal cramping, nausea and vomiting.  She was having difficulty sleeping last night due to itchiness.  No other infectious symptoms.  Past Medical History:  Diagnosis Date  . Allergy    medicated for this  . Arm fracture    LEFT arm; as a child  . Breast cancer ([place])    left  . Diverticulosis   . Family history of breast cancer   . Family history of kidney cancer   . GERD (gastroesophageal reflux disease)   . History of radiation therapy 12/25/12-02/08/13   64.4 Gy to left breast  . Osteoporosis   . Personal history of radiation therapy   . Thyroid disease   . Wears glasses     Patient Active Problem  List   Diagnosis Date Noted  . Genetic testing 12/05/2019  . Family history of breast cancer   . Family history of kidney cancer   . Malignant neoplasm of upper-outer quadrant of right breast in female, estrogen receptor positive ([place]) 11/21/2019  . Malignant neoplasm of upper-inner quadrant of left breast in female, estrogen receptor positive ([place]) 11/09/2012  . DIVERTICULOSIS-COLON 02/02/2010  . DYSPHAGIA UNSPECIFIED 02/02/2010  . DYSPHAGIA 02/02/2010    Past Surgical History:  Procedure Laterality Date  . BREAST BIOPSY  1997   lt breast cyst  . BREAST LUMPECTOMY Left 2014  . BREAST LUMPECTOMY WITH NEEDLE LOCALIZATION Left 11/23/2012   Procedure: BREAST LUMPECTOMY WITH NEEDLE LOCALIZATION;  Surgeon: Rolm Bookbinder, MD;  Location: [facility];  Service: General;  Laterality: Left;  . BREAST LUMPECTOMY WITH RADIOACTIVE SEED AND SENTINEL LYMPH NODE BIOPSY Right 12/26/2019   Procedure: RIGHT BREAST LUMPECTOMY WITH RADIOACTIVE SEED AND RIGHT AXILLARY SENTINEL LYMPH NODE BIOPSY;  Surgeon: Rolm Bookbinder, MD;  Location: [place] [facility];  Service: General;  Laterality: Right;  . COLONOSCOPY    . TONSILLECTOMY    . UPPER GI ENDOSCOPY  2012     OB History   No obstetric history on file.    Obstetric Comments  60 at age of first period Less than a year use of bcp Premarin use for less than a year.        Family History  Problem Relation Age of Onset  .  Heart disease Mother   . Stroke Father   . Stroke Maternal Grandmother   . Stroke Maternal Grandfather   . Heart Problems Paternal Grandfather   . Breast cancer Cousin        dx. younger than 78 (paternal cousin)  . Cancer Cousin        dx. in her 96s, unknown type (paternal cousin)  . Kidney cancer Cousin 60       (maternal cousin)  . Other Cousin        genetic testing - unknown results (maternal cousin)  . Colon cancer Neg Hx   . Esophageal cancer Neg Hx   . Pancreatic cancer Neg Hx   .  Stomach cancer Neg Hx   . Liver disease Neg Hx     Social History   Tobacco Use  . Smoking status: Never Smoker  . Smokeless tobacco: Never Used  Vaping Use  . Vaping Use: Never used  Substance Use Topics  . Alcohol use: Yes    Alcohol/week: 2.0 standard drinks    Types: 2 Glasses of wine per week    Comment: occ  . Drug use: No    Home Medications Prior to Admission medications   Medication Sig Start Date End Date Taking? Authorizing Provider  anastrozole (ARIMIDEX) 1 MG tablet Take 1 tablet (1 mg total) by mouth daily. Start March 10, 2020 01/11/20   Magrinat, [name], MD  Biotin 1000 MCG tablet Take 1,000 mcg by mouth daily.    [provider]  levothyroxine (SYNTHROID, LEVOTHROID) 175 MCG tablet Take 175 mcg by mouth daily before breakfast.    [provider]  predniSONE (STERAPRED UNI-PAK 21 TAB) 10 MG (21) TBPK tablet Take by mouth daily. Take 6 tabs by mouth daily  for 2 days, then 5 tabs for 2 days, then 4 tabs for 2 days, then 3 tabs for 2 days, 2 tabs for 2 days, then 1 tab by mouth daily for 2 days 02/26/20   Lennice Sites, DO  Probiotic Product (PROBIOTIC DAILY) CAPS Take 1 capsule by mouth daily.    [provider]  traMADol (ULTRAM) 50 MG tablet Take 2 tablets (100 mg total) by mouth every 6 (six) hours as needed. 12/26/19   Rolm Bookbinder, MD  UNABLE TO FIND Patient takes numerous supplements and OTCs including CoQ10, vit C, loratadine, pre-biotic poweder, collagen syrup 11/26/19   Magrinat, [name], MD  zinc gluconate 50 MG tablet Take 50 mg by mouth daily. 30 mg    [provider]    Allergies    Sulfa antibiotics and Percocet [oxycodone-acetaminophen]  Review of Systems   Review of Systems  Neurological: Positive for weakness.  All other systems reviewed and are negative.   Physical Exam Updated Vital Signs BP (!) 84/76   Pulse (!) 124   Temp 97.9 F (36.6 C) (Oral)   Resp 17   Ht 5' 6.5" (1.689 m)   Wt 66.2 kg    SpO2 99%   BMI 23.21 kg/m   Physical Exam Vitals and nursing note reviewed.  Constitutional:      General: She is not in acute distress.    Appearance: She is well-developed.  HENT:     Head: Atraumatic.     Mouth/Throat:     Mouth: Mucous membranes are moist.     Comments: Throat exam unremarkable, no mucosal edema no tongue edema.  No stridor or trismus. Eyes:     Conjunctiva/sclera: Conjunctivae normal.  Cardiovascular:  Rate and Rhythm: Tachycardia present.     Pulses: Normal pulses.     Heart sounds: Normal heart sounds.  Pulmonary:     Effort: Pulmonary effort is normal.     Breath sounds: Normal breath sounds.  Abdominal:     Palpations: Abdomen is soft.     Tenderness: There is no abdominal tenderness.  Musculoskeletal:     Cervical back: Neck supple.  Skin:    Findings: Rash (Urticarial rash noted throughout body at different stages.) present.  Neurological:     Mental Status: She is alert and oriented to person, place, and time.     GCS: GCS eye subscore is 4. GCS verbal subscore is 5. GCS motor subscore is 6.  Psychiatric:        Mood and Affect: Mood normal.     ED Results / Procedures / Treatments   Labs (all labs ordered are listed, but only abnormal results are displayed) Labs Reviewed  SARS CORONAVIRUS 2 BY RT PCR ([facility]) - Abnormal; Notable for the following components:      Result Value   SARS Coronavirus 2 POSITIVE (*)    All other components within normal limits  URINALYSIS, ROUTINE W REFLEX MICROSCOPIC - Abnormal; Notable for the following components:   APPearance HAZY (*)    Hgb urine dipstick SMALL (*)    Ketones, ur 5 (*)    Leukocytes,Ua MODERATE (*)    Bacteria, UA RARE (*)    All other components within normal limits  BASIC METABOLIC PANEL  CBC  CBG MONITORING, ED    EKG EKG Interpretation  Date/Time:  Thursday February 28 2020 09:05:29 EDT Ventricular Rate:  102 PR  Interval:  148 QRS Duration: 74 QT Interval:  384 QTC Calculation: 500 R Axis:   71 Text Interpretation: Sinus tachycardia Otherwise normal ECG When compared to prior, similar appearance. No STEMI Confirmed by Antony Blackbird [phone]) on 02/28/2020 12:17:23 PM   Radiology DG Chest 2 View  Result Date: 02/28/2020 CLINICAL DATA:  Pain EXAM: CHEST - 2 VIEW COMPARISON:  2012 FINDINGS: The heart size and mediastinal contours are within normal limits. Both lungs are clear. No pleural effusion or pneumothorax. The visualized skeletal structures are unremarkable. Surgical clips overlie the breasts and right axilla. IMPRESSION: No acute process in the chest. Electronically Signed   By: Macy Mis M.D.   On: 02/28/2020 09:34    Procedures .Critical Care Performed by: Domenic Moras, PA-C Authorized by: Domenic Moras, PA-C   Critical care provider statement:    Critical care time (minutes):  30   Critical care was time spent personally by me on the following activities:  Discussions with consultants, evaluation of patient's response to treatment, examination of patient, ordering and performing treatments and interventions, ordering and review of laboratory studies, ordering and review of radiographic studies, pulse oximetry, re-evaluation of patient's condition, obtaining history from patient or surrogate and review of old charts   (including critical care time)  Medications Ordered in ED Medications  EPINEPHrine (EPI-PEN) 0.3 mg/0.3 mL injection (has no administration in time range)  sodium chloride flush (NS) 0.9 % injection 3 mL (3 mLs Intravenous Given 02/28/20 1246)  sodium chloride 0.9 % bolus 1,000 mL (0 mLs Intravenous Stopped 02/28/20 1433)  EPINEPHrine (EPI-PEN) injection 0.3 mg (0.3 mg Intramuscular Given 02/28/20 1223)  methylPREDNISolone sodium succinate (SOLU-MEDROL) 125 mg/2 mL injection 125 mg (125 mg Intravenous Given 02/28/20 1222)  sodium chloride 0.9 % bolus 1,000  mL (1,000 mLs Intravenous  Bolus from Bag 02/28/20 1419)    ED Course  I have reviewed the triage vital signs and the nursing notes.  Pertinent labs & imaging results that were available during my care of the patient were reviewed by me and considered in my medical decision making (see chart for details).    MDM Rules/Calculators/A&P                          BP (!) 117/55   Pulse (!) 120   Temp 97.9 F (36.6 C) (Oral)   Resp 13   Ht 5' 6.5" (1.689 m)   Wt 66.2 kg   SpO2 95%   BMI 23.21 kg/m   Final Clinical Impression(s) / ED Diagnoses Final diagnoses:  COVID-19 virus infection  Anaphylaxis, initial encounter    Rx / DC Orders ED Discharge Orders         Ordered    EPINEPHrine 0.3 mg/0.3 mL IJ SOAJ injection  As needed     Discontinue  Reprint     02/28/20 1615    benzonatate (TESSALON) 100 MG capsule  3 times daily PRN     Discontinue  Reprint     02/28/20 1615         12:19 PM Patient with ongoing urticaria for the past 5 days after taking an herbal supplementation that she normally does not take.  Symptoms started shortly after taking that supplements which may triggers her urticaria.  She is here today due to feeling lightheadedness and weakness.  Patient was found to be hypotensive and tachycardic.  Initial blood pressure was 84/76 and heart rate of 124.  I am concerning for potential anaphylactic reaction therefore will give epinephrine IM as well as IV fluids and Solu-Medrol.  She has had Benadryl and Pepcid prior to arrival.  Initially patient states 5 days ago she felt bad and was having a mild cough.  She has not been vaccinated for Covid, therefore will obtain COVID-19 screening as well as check UA.  Her chest x-ray here is unremarkable.  3:24 PM COVID-19 test came back positive.  Patient made aware of finding.  UA shows moderate leukocyte esterase but no significant signs of infection.  Labs otherwise reassuring.  Chest x-ray unremarkable, EKG with sinus tachycardia but otherwise no  evidence of A. fib.  After receiving several boluses of fluid as well as epinephrine and close monitoring, patient states she felt much better.  She is stable for discharge.  Encouraged patient to quarantine and recommend patient to encourage her family members for Covid vaccination to decrease risk of spreading.    Krista Butler was evaluated in Emergency Department on 02/28/2020 for the symptoms described in the history of present illness. She was evaluated in the context of the global COVID-19 pandemic, which necessitated consideration that the patient might be at risk for infection with the SARS-CoV-2 virus that causes COVID-19. Institutional protocols and algorithms that pertain to the evaluation of patients at risk for COVID-19 are in a state of rapid change based on information released by regulatory bodies including the CDC and federal and state organizations. These policies and algorithms were followed during the patient's care in the ED.    Domenic Moras, PA-C 02/28/20 1618    Tegeler, Gwenyth Allegra, MD 02/28/20 [phone]

## 2020-02-29 ENCOUNTER — Other Ambulatory Visit (HOSPITAL_COMMUNITY): Payer: Self-pay | Admitting: Oncology

## 2020-02-29 ENCOUNTER — Ambulatory Visit (HOSPITAL_COMMUNITY)
Admission: RE | Admit: 2020-02-29 | Discharge: 2020-02-29 | Disposition: A | Discharge status: Home / Self Care | Payer: Medicare Other | Source: Ambulatory Visit | Attending: Pulmonary Disease | Admitting: Pulmonary Disease

## 2020-02-29 DIAGNOSIS — Z23 Encounter for immunization: Secondary | ICD-10-CM | POA: Insufficient documentation

## 2020-02-29 DIAGNOSIS — U071 COVID-19: Secondary | ICD-10-CM | POA: Insufficient documentation

## 2020-02-29 MED ORDER — SODIUM CHLORIDE 0.9 % IV SOLN: INTRAVENOUS | Status: DC | PRN

## 2020-02-29 MED ORDER — ALBUTEROL SULFATE HFA 108 (90 BASE) MCG/ACT IN AERS: 2.0000 | INHALATION_SPRAY | Freq: Once | RESPIRATORY_TRACT | Status: DC | PRN

## 2020-02-29 MED ORDER — METHYLPREDNISOLONE SODIUM SUCC 125 MG IJ SOLR: 125.0000 mg | Freq: Once | INTRAMUSCULAR | Status: DC | PRN

## 2020-02-29 MED ORDER — DIPHENHYDRAMINE HCL 50 MG/ML IJ SOLN: 50.0000 mg | Freq: Once | INTRAMUSCULAR | Status: DC | PRN

## 2020-02-29 MED ORDER — EPINEPHRINE 0.3 MG/0.3ML IJ SOAJ: 0.3000 mg | Freq: Once | INTRAMUSCULAR | Status: DC | PRN

## 2020-02-29 MED ORDER — SODIUM CHLORIDE 0.9 % IV SOLN
1200.0000 mg | Freq: Once | INTRAVENOUS | Status: AC
  Administered 2020-02-29: 1200 mg via INTRAVENOUS
  Filled 2020-02-29: qty 10

## 2020-02-29 MED ORDER — FAMOTIDINE IN NACL 20-0.9 MG/50ML-% IV SOLN: 20.0000 mg | Freq: Once | INTRAVENOUS | Status: DC | PRN

## 2020-02-29 NOTE — Discharge Instructions (Signed)

## 2020-02-29 NOTE — Progress Notes (Signed)
  Diagnosis: COVID-19  Physician: patrick [id number]  Procedure: Covid Infusion Clinic Med: casirivimab\imdevimab infusion - Provided patient with casirivimab\imdevimab fact sheet for patients, parents and caregivers prior to infusion.  Complications: No immediate complications noted.  Discharge: Discharged home   Krista Butler 02/29/2020

## 2020-02-29 NOTE — Progress Notes (Signed)
I connected by phone with  Krista Butler on 02/29/2020 at 11:30pm to discuss the potential use of an new treatment for mild to moderate COVID-19 viral infection in non-hospitalized patients.   This patient is a age/sex that meets the FDA criteria for Emergency Use Authorization of casirivimab\imdevimab.  Has a (+) direct SARS-CoV-2 viral test result 1. Has mild or moderate COVID-19  2. Is ? 69 years of age and weighs ? 40 kg 3. Is NOT hospitalized due to COVID-19 4. Is NOT requiring oxygen therapy or requiring an increase in baseline oxygen flow rate due to COVID-19 5. Is within 10 days of symptom onset 6. Has at least one of the high risk factor(s) for progression to severe COVID-19 and/or hospitalization as defined in EUA. ? Specific high risk criteria :age   Symptom onset 02/24/2020.   I have spoken and communicated the following to the patient or parent/caregiver:   1. FDA has authorized the emergency use of casirivimab\imdevimab for the treatment of mild to moderate COVID-19 in adults and pediatric patients with positive results of direct SARS-CoV-2 viral testing who are 51 years of age and older weighing at least 40 kg, and who are at high risk for progressing to severe COVID-19 and/or hospitalization.   2. The significant known and potential risks and benefits of casirivimab\imdevimab, and the extent to which such potential risks and benefits are unknown.   3. Information on available alternative treatments and the risks and benefits of those alternatives, including clinical trials.   4. Patients treated with casirivimab\imdevimab should continue to self-isolate and use infection control measures (e.g., wear mask, isolate, social distance, avoid sharing personal items, clean and disinfect "high touch" surfaces, and frequent handwashing) according to CDC guidelines.    5. The patient or parent/caregiver has the option to accept or refuse casirivimab\imdevimab .   After reviewing this  information with the patient, The patient agreed to proceed with receiving casirivimab\imdevimab infusion and will be provided a copy of the Fact sheet prior to receiving the infusion.Rulon Abide, AGNP-C [phone] ([place])

## 2020-03-08 NOTE — Progress Notes (Signed)
  Radiation Oncology         [phone] ________________________________  Name: Krista Butler MRN: [id number]  Date: 02/14/2020  DOB: November 04, 1950  End of Treatment Note  Diagnosis:   right-sided breast cancer     Indication for treatment:  Curative       Radiation treatment dates:   01/21/20 - 02/14/20  Site/dose:   The patient initially received a dose of 42.56 Gy in 16 fractions to the breast using whole-breast tangent fields. This was delivered using a 3-D conformal technique. The patient then received a boost to the seroma. This delivered an additional 8 Gy in 65fractions using a 3 field photon technique due to the depth of the seroma. The total dose was 50.56 Gy.  Narrative: The patient tolerated radiation treatment relatively well.   The patient had some expected skin irritation as she progressed during treatment.    Plan: The patient has completed radiation treatment. The patient will return to radiation oncology clinic for routine followup in one month. I advised the patient to call or return sooner if they have any questions or concerns related to their recovery or treatment. ________________________________  Jodelle Gross, M.D., Ph.D.

## 2020-03-10 ENCOUNTER — Ambulatory Visit: Payer: Medicare PPO | Admitting: Physical Therapy

## 2020-03-17 ENCOUNTER — Telehealth: Payer: Self-pay | Admitting: Radiation Oncology

## 2020-03-17 ENCOUNTER — Encounter: Payer: Self-pay | Admitting: Physical Therapy

## 2020-03-17 NOTE — Telephone Encounter (Signed)
  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   Interval Since Last Radiation:  5 weeks   01/21/20 - 02/14/20: The patient initially received a dose of 42.56 Gy in 16 fractions to the breast using whole-breast tangent fields. This was delivered using a 3-D conformal technique. The patient then received a boost to the seroma. This delivered an additional 8 Gy in 81fractions using a 3 field photon technique due to the depth of the seroma. The total dose was 50.56 Gy.  12/25/12 - 7/17/14The patient was initially treated to 50 Gy at 2 Gy per fraction using whole-breast tangent fields. The patient then received a 14.4 Gy boost to the seroma cavity using an en face electron field. This utilized 12 MeV electorns. The total dose was 64.4 Gy.  Narrative:  The patient was contacted today for routine follow-up. During treatment she did very well with radiotherapy and did not have significant desquamation. She reports she feels that the treatment went well but did develop covid in early August with a profound rash.   Impression/Plan: 1. 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  The patient has been doing well since completion of radiotherapy. We discussed that we would be happy to continue to follow her as needed, but she will also continue to follow up with Dr. Jana Hakim in medical oncology. She was counseled on skin care as well as measures to avoid sun exposure to this area.  2. Survivorship with History of High Grade, ER/PR positive DCIS of the left breast. We discussed the importance of survivorship evaluation and encouraged her to attend her upcoming visit with that clinic. 3. Recent Covid Diagnosis. The patient is  out of the infectious window but did have an impressive rash and this is likely from immune mediated response. This will be followed expectantly.    Carola Rhine, PAC

## 2020-03-24 ENCOUNTER — Other Ambulatory Visit: Payer: Self-pay

## 2020-03-24 ENCOUNTER — Ambulatory Visit: Payer: Medicare PPO | Attending: General Surgery | Admitting: Physical Therapy

## 2020-03-24 DIAGNOSIS — L599 Disorder of the skin and subcutaneous tissue related to radiation, unspecified: Secondary | ICD-10-CM

## 2020-03-24 DIAGNOSIS — Z483 Aftercare following surgery for neoplasm: Secondary | ICD-10-CM | POA: Insufficient documentation

## 2020-03-24 DIAGNOSIS — R293 Abnormal posture: Secondary | ICD-10-CM | POA: Insufficient documentation

## 2020-03-24 DIAGNOSIS — M6281 Muscle weakness (generalized): Secondary | ICD-10-CM | POA: Insufficient documentation

## 2020-03-24 NOTE — Patient Instructions (Signed)
First of all, check with your insurance company to see if provider is in network    [facility] (for wigs and compression sleeves / gloves/gauntlets )  [address] [place], [place] [address] [phone]  Will file some insurances --- call for appointment   [facility] (for mastectomy prosthetics and garments) [address] [place], [place] [address] [phone] Will file some insurances --- call for appointment  [facility]  [address]  [place], [place] [address] [phone] Lower extremity garments  [facility] [address] [place], [place]  [address] [phone]  Cathy Rubel ( Medicaid certified lymphedema fitter) [phone] [email or web address]  Melissa Meares  [facility]  [phone]  [facility] [address] [place], [place] [address] [phone]  Other Resources: National Lymphedema Network:  www.lymphnet.org www.Klosetraining.com for patient articles and self manual lymph drainage information www.lymphedemablog.com has informative articles.  www.compressionguru.com www.lymphedemaproducts.com [email or web address] [email or web address] 

## 2020-03-24 NOTE — Therapy (Signed)
[place], [place], [address] Phone: [phone]   Fax:  [phone]  Physical Therapy Evaluation  Patient Details  Name: Krista Butler MRN: [id number] Date of Birth: 16-Aug-1950 Referring Provider (PT): Dr. Donne Hazel    Encounter Date: 03/24/2020   PT End of Session - 03/24/20 1308    Visit Number 1    Number of Visits 9    Date for PT Re-Evaluation 05/09/20    PT Start Time 1105    PT Stop Time 1205    PT Time Calculation (min) 60 min    Activity Tolerance Patient tolerated treatment well    Behavior During Therapy [facility] for tasks assessed/performed           Past Medical History:  Diagnosis Date  . Allergy    medicated for this  . Arm fracture    LEFT arm; as a child  . Breast cancer ([place])    left  . Diverticulosis   . Family history of breast cancer   . Family history of kidney cancer   . GERD (gastroesophageal reflux disease)   . History of radiation therapy 12/25/12-02/08/13   64.4 Gy to left breast  . Osteoporosis   . Personal history of radiation therapy   . Thyroid disease   . Wears glasses     Past Surgical History:  Procedure Laterality Date  . BREAST BIOPSY  1997   lt breast cyst  . BREAST LUMPECTOMY Left 2014  . BREAST LUMPECTOMY WITH NEEDLE LOCALIZATION Left 11/23/2012   Procedure: BREAST LUMPECTOMY WITH NEEDLE LOCALIZATION;  Surgeon: Rolm Bookbinder, MD;  Location: [place];  Service: General;  Laterality: Left;  . BREAST LUMPECTOMY WITH RADIOACTIVE SEED AND SENTINEL LYMPH NODE BIOPSY Right 12/26/2019   Procedure: RIGHT BREAST LUMPECTOMY WITH RADIOACTIVE SEED AND RIGHT AXILLARY SENTINEL LYMPH NODE BIOPSY;  Surgeon: Rolm Bookbinder, MD;  Location: [place];  Service: General;  Laterality: Right;  . COLONOSCOPY    . TONSILLECTOMY    . UPPER GI ENDOSCOPY  2012    There were no vitals filed for this visit.    Subjective Assessment -  03/24/20 1139    Subjective Pt wants to get back into exercise after having treamtent for right breast cancer.  She wants to get stronger .    Pertinent History right breast cancer with lumpectomy with 6 nodes removed on 12/26/2019 followed by radiation completed 02/14/2020.  Past history of left breast cancer in 2014 with lumpectomy with no nodes removed and radiation, osteoporosis, history of pinched nerves in cervical area with tingling in hands,  pt recently had covid    Patient Stated Goals to get stronger and learn about lymphedema risk    Currently in Pain? Yes    Pain Score 2     Pain Location Neck    Pain Orientation Posterior    Pain Descriptors / Indicators Aching;Numbness    Pain Type Chronic pain    Pain Radiating Towards sometimes to hands mostly to neck    Pain Onset More than a month ago    Pain Frequency Intermittent    Aggravating Factors  possibly arm exercises aggravates it    Pain Relieving Factors tyleonol, heat packs    Effect of Pain on Daily Activities not really              [facility] PT Assessment - 03/24/20 0001      Assessment   Medical Diagnosis right breast  cancer     Referring Provider (PT) Dr. Donne Hazel     Onset Date/Surgical Date 12/04/19    Hand Dominance Right      Precautions   Precautions Other (comment)    Precaution Comments at risk for right lymphedema , osteoporosis       Restrictions   Weight Bearing Restrictions No      Balance Screen   Has the patient fallen in the past 6 months No    Has the patient had a decrease in activity level because of a fear of falling?  No    Is the patient reluctant to leave their home because of a fear of falling?  No      Home Ecologist residence    Living Arrangements Spouse/significant other    Available Help at Discharge Available PRN/intermittently      Prior Function   Level of [place] Retired    Leisure regular exercises, walking,  water aerobics,wants to get back to [employer]   Overall Cognitive Status Within Functional Limits for tasks assessed      Observation/Other Assessments   Observations thin female with scar at right lateral breast with visible congestion, darkened skin, well healed incision in left breast compression marks on both sides and lateral trunk from underwire and bra band     Quick DASH  29.55      Sensation   Light Touch Impaired by gross assessment    Additional Comments Pt reports internittent numbness in hands likely form neck issues       Coordination   Gross Motor Movements are Fluid and Coordinated Yes      Posture/Postural Control   Posture/Postural Control Postural limitations    Postural Limitations Rounded Shoulders;Forward head      ROM / Strength   AROM / PROM / Strength AROM;Strength      AROM   AROM Assessment Site Shoulder    Right/Left Shoulder Right;Left    Right Shoulder Flexion 170 Degrees    Right Shoulder ABduction 165 Degrees    Left Shoulder Flexion 165 Degrees    Left Shoulder ABduction 170 Degrees      Strength   Overall Strength Deficits    Overall Strength Comments pt reports weakness in her hands that she has had for a long time and pain in her neck after doing strength exercises in water aerobics       Palpation   Palpation comment pt with firmness in right lateral breast distal to scar              LYMPHEDEMA/ONCOLOGY QUESTIONNAIRE - 03/24/20 0001      Type   Cancer Type right and left breast cancer       Surgeries   Lumpectomy Date 12/26/19   left breast in 2014   Number Lymph Nodes Removed 6   from right , 0 from left      Treatment   Active Chemotherapy Treatment No    Past Chemotherapy Treatment No    Active Radiation Treatment No    Past Radiation Treatment Yes    Date 02/13/20   on right, 2014 on left    Body Site breast and axilla     Current Hormone Treatment Yes    Drug Name anastrozole     Past Hormone Therapy  Yes    Date --   for 5 years  Drug Name tamoxifen      What other symptoms do you have   Are you Having Heaviness or Tightness Yes   in right breast and axilla   Are you having Pain Yes    Are you having pitting edema No    Is it Hard or Difficult finding clothes that fit No    Do you have infections No    Is there Decreased scar mobility Yes    Stemmer Sign No      Right Upper Extremity Lymphedema   10 cm Proximal to Olecranon Process 26 cm    Olecranon Process 24 cm    15 cm Proximal to Ulnar Styloid Process 21 cm    Just Proximal to Ulnar Styloid Process 14.7 cm    Across Hand at [employer] 18 cm    At [place] of 2nd Digit 5.6 cm      Left Upper Extremity Lymphedema   10 cm Proximal to Olecranon Process 25.7 cm    Olecranon Process 24 cm    15 cm Proximal to Ulnar Styloid Process 21 cm    Just Proximal to Ulnar Styloid Process 14.5 cm    Across Hand at [employer] 18 cm    At [place] of 2nd Digit 5.6 cm                 Quick Dash - 03/24/20 0001    Open a tight or new jar Severe difficulty    Do heavy household chores (wash walls, wash floors) Moderate difficulty    Carry a shopping bag or briefcase Mild difficulty    Wash your back Mild difficulty    Use a knife to cut food No difficulty    Recreational activities in which you take some force or impact through your arm, shoulder, or hand (golf, hammering, tennis) Moderate difficulty    During the past week, to what extent has your arm, shoulder or hand problem interfered with your normal social activities with family, friends, neighbors, or groups? Slightly    During the past week, to what extent has your arm, shoulder or hand problem limited your work or other regular daily activities Modererately    Arm, shoulder, or hand pain. None    Tingling (pins and needles) in your arm, shoulder, or hand Mild    Difficulty Sleeping No difficulty    DASH Score 29.55 %            Objective measurements completed  on examination: See above findings.       [facility] Adult PT Treatment/Exercise - 03/24/20 0001      Self-Care   Self-Care Other Self-Care Comments    Other Self-Care Comments  Pt given information about ABC class, how to get a compression bra, [employer] programs                   PT Education - 03/24/20 1307    Education Details where to get a compression bra, also gave pt a script and asked her to take it with her    Person(s) Educated Patient    Methods Explanation;Handout    Comprehension Verbalized understanding               PT Long Term Goals - 03/24/20 1325      PT LONG TERM GOAL #1   Title Pt will report she has an understanding of lymphedema risk reduction practices    Time 6    Period  Weeks    Status New      PT LONG TERM GOAL #2   Title Pt will report the fullness in her right breast is decreased by 75%    Time 6    Period Weeks    Status New      PT LONG TERM GOAL #3   Title Pt will report she is able to manage the swelling in her right breast with self MLD and compression    Time 6    Period Weeks      PT LONG TERM GOAL #4   Title Pt will report she is safely able to do Pilates exercise at home being mindful of osteoporosis precautions    Time 6    Period Weeks    Status New                  Plan - 03/24/20 1311    Clinical Impression Statement Pt presents to PT after right  lumpectomy with 6 nodes removed and radiation for recurrent breast cancer after having lumpectomy with no nodes removed and radiation for left breast cancer in 2014. She has congestion with fullness and tendernedd in her right lateral breast. She also reports that she has osteoporosis and pain in her neck with numbness in her hands. She wants to learn what precautions she needs to take for exercise because of her osteoporosis and wants to do Pilates. She was given information about ABC class to learn about lymphedema and risk reduction as well as information  about offerrings from the [facility],    Personal Factors and Comorbidities Comorbidity 3+    Comorbidities previous left breast cancer, radiation to both sides of chest, osteoporosis, cervical pain with numbness in hands    Examination-Activity Limitations Carry    Stability/Clinical Decision Making Stable/Uncomplicated    Clinical Decision Making Low    Rehab Potential Excellent    PT Frequency 2x / week    PT Duration 6 weeks   appointments may be limited, pt going on vacation   PT Treatment/Interventions ADLs/Self Care Home Management;Therapeutic exercise;Therapeutic activities;Neuromuscular re-education;Patient/family education;Orthotic Fit/Training;Manual lymph drainage;Manual techniques;Compression bandaging;Scar mobilization;Passive range of motion    PT Next Visit Plan see if pt got compression bra, begin instruction in and perform manual lymph drainage to right breast, teach lymphedema risk reduction or make sure she attends ABC class, teach osteoporosis prescautions and exercise, coordinate Pilates exercise sessions with Cheryln Manly    Recommended Other Services ABC class, Pilates    Consulted and Agree with Plan of Care Patient           Patient will benefit from skilled therapeutic intervention in order to improve the following deficits and impairments:  Decreased skin integrity, Decreased scar mobility, Decreased knowledge of precautions, Increased edema, Postural dysfunction, Pain, Impaired UE functional use, Increased fascial restricitons, Decreased strength, Decreased safety awareness, Decreased activity tolerance  Visit Diagnosis: Aftercare following surgery for neoplasm - Plan: PT plan of care cert/re-cert  Disorder of the skin and subcutaneous tissue related to radiation, unspecified  Muscle weakness (generalized) - Plan: PT plan of care cert/re-cert  Abnormal posture - Plan: PT plan of care cert/re-cert     Problem List Patient Active Problem List    Diagnosis Date Noted  . Genetic testing 12/05/2019  . Family history of breast cancer   . Family history of kidney cancer   . Malignant neoplasm of upper-outer quadrant of right breast in female, estrogen receptor positive ([place]) 11/21/2019  .  Malignant neoplasm of upper-inner quadrant of left breast in female, estrogen receptor positive ([place]) 11/09/2012  . DIVERTICULOSIS-COLON 02/02/2010  . DYSPHAGIA UNSPECIFIED 02/02/2010  . DYSPHAGIA 02/02/2010   Donato Heinz. Owens Shark PT  Norwood Levo 03/24/2020, 1:31 PM  [place], [place], [address] Phone: [phone]   Fax:  [phone]  Name: Krista Butler MRN: [id number] Date of Birth: 1951-04-19

## 2020-03-26 ENCOUNTER — Other Ambulatory Visit: Payer: Self-pay

## 2020-03-26 ENCOUNTER — Ambulatory Visit: Payer: Medicare PPO | Attending: General Surgery | Admitting: Physical Therapy

## 2020-03-26 ENCOUNTER — Encounter: Payer: Self-pay | Admitting: Physical Therapy

## 2020-03-26 DIAGNOSIS — L599 Disorder of the skin and subcutaneous tissue related to radiation, unspecified: Secondary | ICD-10-CM | POA: Diagnosis present

## 2020-03-26 DIAGNOSIS — M6281 Muscle weakness (generalized): Secondary | ICD-10-CM | POA: Insufficient documentation

## 2020-03-26 DIAGNOSIS — Z483 Aftercare following surgery for neoplasm: Secondary | ICD-10-CM

## 2020-03-26 DIAGNOSIS — R293 Abnormal posture: Secondary | ICD-10-CM | POA: Diagnosis present

## 2020-03-26 NOTE — Therapy (Signed)
[place], [place], [address] Phone: [phone]   Fax:  [phone]  Physical Therapy Treatment  Patient Details  Name: DELLE Butler MRN: [id number] Date of Birth: 10-25-1950 Referring Provider (PT): Dr. Donne Hazel    Encounter Date: 03/26/2020   PT End of Session - 03/26/20 0959    Visit Number 2    Number of Visits 9    Date for PT Re-Evaluation 05/09/20    PT Start Time 0906    PT Stop Time 0958    PT Time Calculation (min) 52 min    Activity Tolerance Patient tolerated treatment well    Behavior During Therapy [facility] for tasks assessed/performed           Past Medical History:  Diagnosis Date   Allergy    medicated for this   Arm fracture    LEFT arm; as a child   Breast cancer ([place])    left   Diverticulosis    Family history of breast cancer    Family history of kidney cancer    GERD (gastroesophageal reflux disease)    History of radiation therapy 12/25/12-02/08/13   64.4 Gy to left breast   Osteoporosis    Personal history of radiation therapy    Thyroid disease    Wears glasses     Past Surgical History:  Procedure Laterality Date   BREAST BIOPSY  1997   lt breast cyst   BREAST LUMPECTOMY Left 2014   BREAST LUMPECTOMY WITH NEEDLE LOCALIZATION Left 11/23/2012   Procedure: BREAST LUMPECTOMY WITH NEEDLE LOCALIZATION;  Surgeon: Rolm Bookbinder, MD;  Location: [place];  Service: General;  Laterality: Left;   BREAST LUMPECTOMY WITH RADIOACTIVE SEED AND SENTINEL LYMPH NODE BIOPSY Right 12/26/2019   Procedure: RIGHT BREAST LUMPECTOMY WITH RADIOACTIVE SEED AND RIGHT AXILLARY SENTINEL LYMPH NODE BIOPSY;  Surgeon: Rolm Bookbinder, MD;  Location: [facility];  Service: General;  Laterality: Right;   COLONOSCOPY     TONSILLECTOMY     UPPER GI ENDOSCOPY  2012    There were no vitals filed for this visit.   Subjective Assessment -  03/26/20 0908    Subjective My breast is about the same as it was.    Pertinent History right breast cancer with lumpectomy with 6 nodes removed on 12/26/2019 followed by radiation completed 02/14/2020.  Past history of left breast cancer in 2014 with lumpectomy with no nodes removed and radiation, osteoporosis, history of pinched nerves in cervical area with tingling in hands,  pt recently had covid    Patient Stated Goals to get stronger and learn about lymphedema risk    Currently in Pain? No/denies    Pain Score 0-No pain                             [facility] Adult PT Treatment/Exercise - 03/26/20 0001      Manual Therapy   Manual Therapy Manual Lymphatic Drainage (MLD);Myofascial release;Edema management    Edema Management assessed pt's sports bra for compression- educated pt that she needs a bra with more compression as her current bra was very stretchy    Myofascial Release along lumpectomy scar    Manual Lymphatic Drainage (MLD) short neck, superficial and deep abdominals, right inguinal nodes and establishment of axillo inguinal pathway, left axillary nodes and establishment of inter axillary pathway, R breast moving fluid towards pathways then retracing all steps- verbally  educated pt throughout about anatomy and physiology of lymphatic system, correct skin stretch technique and sequence                       PT Long Term Goals - 03/24/20 1325      PT LONG TERM GOAL #1   Title Pt will report she has an understanding of lymphedema risk reduction practices    Time 6    Period Weeks    Status New      PT LONG TERM GOAL #2   Title Pt will report the fullness in her right breast is decreased by 75%    Time 6    Period Weeks    Status New      PT LONG TERM GOAL #3   Title Pt will report she is able to manage the swelling in her right breast with self MLD and compression    Time 6    Period Weeks      PT LONG TERM GOAL #4   Title Pt will report she  is safely able to do Pilates exercise at home being mindful of osteoporosis precautions    Time 6    Period Weeks    Status New                 Plan - 03/26/20 0959    Clinical Impression Statement Began MLD to R breast and verbally instructed pt throughout on correct technique and skin stretch. Will provide handout and have pt return demonstrate at next session. Began scar mobilization to scar tissue at R lumpectomy scar in area of tightness and fibrosis.    PT Frequency 2x / week    PT Duration 6 weeks    PT Treatment/Interventions ADLs/Self Care Home Management;Therapeutic exercise;Therapeutic activities;Neuromuscular re-education;Patient/family education;Orthotic Fit/Training;Manual lymph drainage;Manual techniques;Compression bandaging;Scar mobilization;Passive range of motion    PT Next Visit Plan see if pt got compression bra, continue instruction in and perform manual lymph drainage to right breast, teach lymphedema risk reduction or make sure she attends ABC class, teach osteoporosis prescautions and exercise, coordinate Pilates exercise sessions with Jenn Paa    Consulted and Agree with Plan of Care Patient           Patient will benefit from skilled therapeutic intervention in order to improve the following deficits and impairments:  Decreased skin integrity, Decreased scar mobility, Decreased knowledge of precautions, Increased edema, Postural dysfunction, Pain, Impaired UE functional use, Increased fascial restricitons, Decreased strength, Decreased safety awareness, Decreased activity tolerance  Visit Diagnosis: Aftercare following surgery for neoplasm  Disorder of the skin and subcutaneous tissue related to radiation, unspecified     Problem List Patient Active Problem List   Diagnosis Date Noted   Genetic testing 12/05/2019   Family history of breast cancer    Family history of kidney cancer    Malignant neoplasm of upper-outer quadrant of right breast in  female, estrogen receptor positive ([place]) 11/21/2019   Malignant neoplasm of upper-inner quadrant of left breast in female, estrogen receptor positive ([place]) 11/09/2012   DIVERTICULOSIS-COLON 02/02/2010   DYSPHAGIA UNSPECIFIED 02/02/2010   DYSPHAGIA 02/02/2010    Krista Butler [facility] 03/26/2020, 10:02 AM  [place], [place], [address] Phone: [phone]   Fax:  [phone]  Name: TORUNN CHANCELLOR MRN: [id number] Date of Birth: 1951/04/14  Manus Gunning, PT 03/26/20 10:02 AM

## 2020-04-03 ENCOUNTER — Other Ambulatory Visit: Payer: Self-pay

## 2020-04-03 ENCOUNTER — Ambulatory Visit: Payer: Medicare PPO | Admitting: Rehabilitation

## 2020-04-03 ENCOUNTER — Encounter: Payer: Self-pay | Admitting: Rehabilitation

## 2020-04-03 DIAGNOSIS — Z483 Aftercare following surgery for neoplasm: Secondary | ICD-10-CM

## 2020-04-03 DIAGNOSIS — M6281 Muscle weakness (generalized): Secondary | ICD-10-CM

## 2020-04-03 DIAGNOSIS — L599 Disorder of the skin and subcutaneous tissue related to radiation, unspecified: Secondary | ICD-10-CM

## 2020-04-03 DIAGNOSIS — R293 Abnormal posture: Secondary | ICD-10-CM

## 2020-04-03 NOTE — Patient Instructions (Signed)

## 2020-04-03 NOTE — Therapy (Signed)
[place], [place], [address] Phone: [phone]   Fax:  [phone]  Physical Therapy Treatment  Patient Details  Name: Krista Butler MRN: [id number] Date of Birth: February 25, 1951 Referring Provider (PT): Dr. Donne Hazel    Encounter Date: 04/03/2020   PT End of Session - 04/03/20 0851    Visit Number 3    Number of Visits 9    Date for PT Re-Evaluation 05/09/20    PT Start Time 0800    PT Stop Time 0849    PT Time Calculation (min) 49 min    Activity Tolerance Patient tolerated treatment well    Behavior During Therapy [facility] for tasks assessed/performed           Past Medical History:  Diagnosis Date  . Allergy    medicated for this  . Arm fracture    LEFT arm; as a child  . Breast cancer ([place])    left  . Diverticulosis   . Family history of breast cancer   . Family history of kidney cancer   . GERD (gastroesophageal reflux disease)   . History of radiation therapy 12/25/12-02/08/13   64.4 Gy to left breast  . Osteoporosis   . Personal history of radiation therapy   . Thyroid disease   . Wears glasses     Past Surgical History:  Procedure Laterality Date  . BREAST BIOPSY  1997   lt breast cyst  . BREAST LUMPECTOMY Left 2014  . BREAST LUMPECTOMY WITH NEEDLE LOCALIZATION Left 11/23/2012   Procedure: BREAST LUMPECTOMY WITH NEEDLE LOCALIZATION;  Surgeon: Rolm Bookbinder, MD;  Location: [place];  Service: General;  Laterality: Left;  . BREAST LUMPECTOMY WITH RADIOACTIVE SEED AND SENTINEL LYMPH NODE BIOPSY Right 12/26/2019   Procedure: RIGHT BREAST LUMPECTOMY WITH RADIOACTIVE SEED AND RIGHT AXILLARY SENTINEL LYMPH NODE BIOPSY;  Surgeon: Rolm Bookbinder, MD;  Location: [place];  Service: General;  Laterality: Right;  . COLONOSCOPY    . TONSILLECTOMY    . UPPER GI ENDOSCOPY  2012    There were no vitals filed for this visit.   Subjective Assessment -  04/03/20 0800    Subjective nothing new. I was unable to get a compression bra.    Pertinent History right breast cancer with lumpectomy with 6 nodes removed on 12/26/2019 followed by radiation completed 02/14/2020.  Past history of left breast cancer in 2014 with lumpectomy with no nodes removed and radiation, osteoporosis, history of pinched nerves in cervical area with tingling in hands,  pt recently had covid    Patient Stated Goals to get stronger and learn about lymphedema risk    Currently in Pain? No/denies   I get some scar tissue region pain at night                            [facility] Adult PT Treatment/Exercise - 04/03/20 0001      Manual Therapy   Manual therapy comments encouraged pt to still get a compression bra    Edema Management made pt small chip pack circle to use around incision prior to MLD    Myofascial Release along lumpectomy scar    Manual Lymphatic Drainage (MLD) focus on self MLD instruction with handout performed in supine HOB elevated: omitted interaxillary due to location of edema and for pt ease; 5 breaths, circles at the collarbone, Rt axillary nodes, Rt inguinal nodes, Rt axilloinguinal anastamosis  and then work on lateral breast towards anastamosis                       PT Long Term Goals - 03/24/20 1325      PT LONG TERM GOAL #1   Title Pt will report she has an understanding of lymphedema risk reduction practices    Time 6    Period Weeks    Status New      PT LONG TERM GOAL #2   Title Pt will report the fullness in her right breast is decreased by 75%    Time 6    Period Weeks    Status New      PT LONG TERM GOAL #3   Title Pt will report she is able to manage the swelling in her right breast with self MLD and compression    Time 6    Period Weeks      PT LONG TERM GOAL #4   Title Pt will report she is safely able to do Pilates exercise at home being mindful of osteoporosis precautions    Time 6    Period Weeks     Status New                 Plan - 04/03/20 8891    Clinical Impression Statement continued Rt breast MLD with mostly lumpectomy incision scar tissue/fibrosis and mild edema present laterally.  Considerable softening after MT today with pt reporting everything felt much looser.  Discussed starting osteoporosis based exercises tomorrow if breast needs less work time.    PT Frequency 2x / week    PT Duration 6 weeks    PT Treatment/Interventions ADLs/Self Care Home Management;Therapeutic exercise;Therapeutic activities;Neuromuscular re-education;Patient/family education;Orthotic Fit/Training;Manual lymph drainage;Manual techniques;Compression bandaging;Scar mobilization;Passive range of motion    PT Next Visit Plan see if pt got compression bra, any MLD questions? continue instruction in and perform manual lymph drainage to right breast, begin osteoporosis based TE    Consulted and Agree with Plan of Care Patient           Patient will benefit from skilled therapeutic intervention in order to improve the following deficits and impairments:     Visit Diagnosis: Aftercare following surgery for neoplasm  Disorder of the skin and subcutaneous tissue related to radiation, unspecified  Muscle weakness (generalized)  Abnormal posture     Problem List Patient Active Problem List   Diagnosis Date Noted  . Genetic testing 12/05/2019  . Family history of breast cancer   . Family history of kidney cancer   . Malignant neoplasm of upper-outer quadrant of right breast in female, estrogen receptor positive ([place]) 11/21/2019  . Malignant neoplasm of upper-inner quadrant of left breast in female, estrogen receptor positive ([place]) 11/09/2012  . DIVERTICULOSIS-COLON 02/02/2010  . DYSPHAGIA UNSPECIFIED 02/02/2010  . DYSPHAGIA 02/02/2010    Stark Bray 04/03/2020, 8:54 AM  [place], [place],  [address] Phone: [phone]   Fax:  [phone]  Name: Krista Butler MRN: [id number] Date of Birth: Aug 10, 1950

## 2020-04-04 ENCOUNTER — Encounter: Payer: Self-pay | Admitting: Rehabilitation

## 2020-04-04 ENCOUNTER — Ambulatory Visit: Payer: Medicare PPO | Admitting: Rehabilitation

## 2020-04-04 DIAGNOSIS — R293 Abnormal posture: Secondary | ICD-10-CM

## 2020-04-04 DIAGNOSIS — L599 Disorder of the skin and subcutaneous tissue related to radiation, unspecified: Secondary | ICD-10-CM

## 2020-04-04 DIAGNOSIS — Z483 Aftercare following surgery for neoplasm: Secondary | ICD-10-CM

## 2020-04-04 DIAGNOSIS — M6281 Muscle weakness (generalized): Secondary | ICD-10-CM

## 2020-04-04 NOTE — Therapy (Signed)
[place], [place], [address] Phone: [phone]   Fax:  [phone]  Physical Therapy Treatment  Patient Details  Name: Krista Butler MRN: [id number] Date of Birth: 1950-08-17 Referring Provider (PT): Dr. Donne Hazel    Encounter Date: 04/04/2020   PT End of Session - 04/04/20 0957    Visit Number 4    Number of Visits 9    Date for PT Re-Evaluation 05/09/20    PT Start Time 0900    PT Stop Time 0954    PT Time Calculation (min) 54 min    Activity Tolerance Patient tolerated treatment well    Behavior During Therapy [facility] for tasks assessed/performed           Past Medical History:  Diagnosis Date  . Allergy    medicated for this  . Arm fracture    LEFT arm; as a child  . Breast cancer ([place])    left  . Diverticulosis   . Family history of breast cancer   . Family history of kidney cancer   . GERD (gastroesophageal reflux disease)   . History of radiation therapy 12/25/12-02/08/13   64.4 Gy to left breast  . Osteoporosis   . Personal history of radiation therapy   . Thyroid disease   . Wears glasses     Past Surgical History:  Procedure Laterality Date  . BREAST BIOPSY  1997   lt breast cyst  . BREAST LUMPECTOMY Left 2014  . BREAST LUMPECTOMY WITH NEEDLE LOCALIZATION Left 11/23/2012   Procedure: BREAST LUMPECTOMY WITH NEEDLE LOCALIZATION;  Surgeon: Rolm Bookbinder, MD;  Location: [facility];  Service: General;  Laterality: Left;  . BREAST LUMPECTOMY WITH RADIOACTIVE SEED AND SENTINEL LYMPH NODE BIOPSY Right 12/26/2019   Procedure: RIGHT BREAST LUMPECTOMY WITH RADIOACTIVE SEED AND RIGHT AXILLARY SENTINEL LYMPH NODE BIOPSY;  Surgeon: Rolm Bookbinder, MD;  Location: [facility];  Service: General;  Laterality: Right;  . COLONOSCOPY    . TONSILLECTOMY    . UPPER GI ENDOSCOPY  2012    There were no vitals filed for this visit.   Subjective Assessment -  04/04/20 0900    Subjective I have no questions on the massage.  It feels good today    Pertinent History right breast cancer with lumpectomy with 6 nodes removed on 12/26/2019 followed by radiation completed 02/14/2020.  Past history of left breast cancer in 2014 with lumpectomy with no nodes removed and radiation, osteoporosis, history of pinched nerves in cervical area with tingling in hands,  pt recently had covid    Patient Stated Goals to get stronger and learn about lymphedema risk    Currently in Pain? No/denies                             [facility] Adult PT Treatment/Exercise - 04/04/20 0001      Exercises   Exercises Other Exercises      Pilates   Other Pilates Phase 1 post surgery pilates performed today with handout; diaphragmatic breaths with hands on ribcage x 10, PPT x 5, heel slide x 5 bil with TrA activation, bridge x 10, supine scapular elevation and depression with TrA x 10, bil shoulder cane flexion with TrA x 10.  Pt will try these and move to the next level if reported easy      Manual Therapy   Manual therapy comments Pt called for the  bra and nobody called back     Myofascial Release along lumpectomy scar    Manual Lymphatic Drainage (MLD) omitted interaxillary due to location of edema and for pt ease; 5 breaths, circles at the collarbone, Rt axillary nodes, Rt inguinal nodes, Rt axilloinguinal anastamosis and then work on lateral breast towards anastamosis                       PT Long Term Goals - 03/24/20 1325      PT LONG TERM GOAL #1   Title Pt will report she has an understanding of lymphedema risk reduction practices    Time 6    Period Weeks    Status New      PT LONG TERM GOAL #2   Title Pt will report the fullness in her right breast is decreased by 75%    Time 6    Period Weeks    Status New      PT LONG TERM GOAL #3   Title Pt will report she is able to manage the swelling in her right breast with self MLD and  compression    Time 6    Period Weeks      PT LONG TERM GOAL #4   Title Pt will report she is safely able to do Pilates exercise at home being mindful of osteoporosis precautions    Time 6    Period Weeks    Status New                 Plan - 04/04/20 6979    Clinical Impression Statement Continued Rt breast scar tissue/fibrosis work along with MLD post release with softening quickly of scar tissue area.  Started phase 1 pilates post breast surgery with pt enjoying new exercises and will most likely report this is easy and ready to move on to the next step on next visit. (Phase 2 supine pilates template)    PT Frequency 2x / week    PT Duration 6 weeks    PT Treatment/Interventions ADLs/Self Care Home Management;Therapeutic exercise;Therapeutic activities;Neuromuscular re-education;Patient/family education;Orthotic Fit/Training;Manual lymph drainage;Manual techniques;Compression bandaging;Scar mobilization;Passive range of motion    PT Next Visit Plan see if pt got compression bra, continue instruction and perform manual lymph drainage to right breast, how was pilates? advance?    Consulted and Agree with Plan of Care Patient           Patient will benefit from skilled therapeutic intervention in order to improve the following deficits and impairments:     Visit Diagnosis: Aftercare following surgery for neoplasm  Disorder of the skin and subcutaneous tissue related to radiation, unspecified  Muscle weakness (generalized)  Abnormal posture     Problem List Patient Active Problem List   Diagnosis Date Noted  . Genetic testing 12/05/2019  . Family history of breast cancer   . Family history of kidney cancer   . Malignant neoplasm of upper-outer quadrant of right breast in female, estrogen receptor positive ([place]) 11/21/2019  . Malignant neoplasm of upper-inner quadrant of left breast in female, estrogen receptor positive ([place]) 11/09/2012  . DIVERTICULOSIS-COLON  02/02/2010  . DYSPHAGIA UNSPECIFIED 02/02/2010  . DYSPHAGIA 02/02/2010    Stark Bray 04/04/2020, 10:00 AM  [place], [place], [address] Phone: [phone]   Fax:  [phone]  Name: Krista Butler MRN: [id number] Date of Birth: 1951-01-08

## 2020-04-04 NOTE — Patient Instructions (Signed)
Access Code: [email or web address]: [email or web address]: 09/10/2021Prepared by: Marcene Brawn TevisExercises  Supine Posterior Pelvic Tilt - 1 x daily - 7 x weekly - 10 reps - 1 sets - 2-3 second hold  Hooklying Rib Cage Breathing - 1 x daily - 7 x weekly - 10 reps - 1 sets  Supine Lower Trapezius Strengthening - 1 x daily - 7 x weekly - 5 reps - 1 sets  Supine Bilateral Shoulder Protraction - 1 x daily - 7 x weekly - 10 reps - 1 sets  Pilates Bridge - 1 x daily - 7 x weekly - 10 reps - 1 sets  Supine Transversus Abdominis Bracing with Heel Slide - 1 x daily - 7 x weekly - 5-10 reps - 1 sets  Supine Shoulder Flexion with Dowel - 1 x daily - 7 x weekly - 5-10 reps - 1 sets

## 2020-04-10 ENCOUNTER — Other Ambulatory Visit: Payer: Self-pay

## 2020-04-10 ENCOUNTER — Ambulatory Visit: Payer: Medicare PPO

## 2020-04-10 DIAGNOSIS — Z483 Aftercare following surgery for neoplasm: Secondary | ICD-10-CM | POA: Diagnosis not present

## 2020-04-10 DIAGNOSIS — L599 Disorder of the skin and subcutaneous tissue related to radiation, unspecified: Secondary | ICD-10-CM

## 2020-04-10 DIAGNOSIS — M6281 Muscle weakness (generalized): Secondary | ICD-10-CM

## 2020-04-10 DIAGNOSIS — R293 Abnormal posture: Secondary | ICD-10-CM

## 2020-04-10 NOTE — Therapy (Signed)
[place], [place], [address] Phone: [phone]   Fax:  [phone]  Physical Therapy Treatment  Patient Details  Name: Krista Butler MRN: [id number] Date of Birth: 11-07-1950 Referring Provider (PT): Dr. Donne Hazel    Encounter Date: 04/10/2020   PT End of Session - 04/10/20 1211    Visit Number 5    Number of Visits 9    Date for PT Re-Evaluation 05/09/20    PT Start Time 1108    PT Stop Time 1210    PT Time Calculation (min) 62 min    Activity Tolerance Patient tolerated treatment well    Behavior During Therapy [facility] for tasks assessed/performed           Past Medical History:  Diagnosis Date  . Allergy    medicated for this  . Arm fracture    LEFT arm; as a child  . Breast cancer ([place])    left  . Diverticulosis   . Family history of breast cancer   . Family history of kidney cancer   . GERD (gastroesophageal reflux disease)   . History of radiation therapy 12/25/12-02/08/13   64.4 Gy to left breast  . Osteoporosis   . Personal history of radiation therapy   . Thyroid disease   . Wears glasses     Past Surgical History:  Procedure Laterality Date  . BREAST BIOPSY  1997   lt breast cyst  . BREAST LUMPECTOMY Left 2014  . BREAST LUMPECTOMY WITH NEEDLE LOCALIZATION Left 11/23/2012   Procedure: BREAST LUMPECTOMY WITH NEEDLE LOCALIZATION;  Surgeon: Rolm Bookbinder, MD;  Location: [facility];  Service: General;  Laterality: Left;  . BREAST LUMPECTOMY WITH RADIOACTIVE SEED AND SENTINEL LYMPH NODE BIOPSY Right 12/26/2019   Procedure: RIGHT BREAST LUMPECTOMY WITH RADIOACTIVE SEED AND RIGHT AXILLARY SENTINEL LYMPH NODE BIOPSY;  Surgeon: Rolm Bookbinder, MD;  Location: [facility];  Service: General;  Laterality: Right;  . COLONOSCOPY    . TONSILLECTOMY    . UPPER GI ENDOSCOPY  2012    There were no vitals filed for this visit.   Subjective Assessment -  04/10/20 1112    Subjective I got my compression bra and have been wearing that. I can tell it compresses more than my sports bra but it's good. Also doing the self MLD and I think it's going well. Not as good as you guys, but not bad.    Pertinent History right breast cancer with lumpectomy with 6 nodes removed on 12/26/2019 followed by radiation completed 02/14/2020.  Past history of left breast cancer in 2014 with lumpectomy with no nodes removed and radiation, osteoporosis, history of pinched nerves in cervical area with tingling in hands,  pt recently had covid    Patient Stated Goals to get stronger and learn about lymphedema risk    Currently in Pain? No/denies                             [facility] Adult PT Treatment/Exercise - 04/10/20 0001      Lumbar Exercises: Supine   Pelvic Tilt 10 reps;5 seconds    Clam 5 reps   with pelvic tilt   Heel Slides 5 reps   with pelvict tilt   Bent Knee Raise 5 reps   with pelvic tilt   Dead Bug 5 reps   with pelvic tilt     Shoulder Exercises: Supine  Horizontal ABduction Strengthening;Both;5 reps;Theraband    Theraband Level (Shoulder Horizontal ABduction) Level 2 (Red)    Horizontal ABduction Limitations Pt returned therapist demo for each of supine scapular series    External Rotation Strengthening;Both;5 reps;Theraband    Theraband Level (Shoulder External Rotation) Level 2 (Red)    Flexion Strengthening;Both;5 reps;Theraband   NArrow and WIde Grip, 5x each   Theraband Level (Shoulder Flexion) Level 2 (Red)    Diagonals Strengthening;Right;Left;5 reps;Theraband    Theraband Level (Shoulder Diagonals) Level 2 (Red)      Manual Therapy   Manual Therapy Myofascial release;Manual Lymphatic Drainage (MLD);Passive ROM    Myofascial Release along lumpectomy scar and at Rt axilla at end range of flexion and abduction    Manual Lymphatic Drainage (MLD) omitted interaxillary due to location of edema and for pt ease; 5 breaths, short  neck, Rt inguinal nodes, Rt axillo-inguinal anastamosis and then work on lateral breast towards anastamosis    Passive ROM In Supine to Rt shoulder into flexion and abduction for MFR                       PT Long Term Goals - 03/24/20 1325      PT LONG TERM GOAL #1   Title Pt will report she has an understanding of lymphedema risk reduction practices    Time 6    Period Weeks    Status New      PT LONG TERM GOAL #2   Title Pt will report the fullness in her right breast is decreased by 75%    Time 6    Period Weeks    Status New      PT LONG TERM GOAL #3   Title Pt will report she is able to manage the swelling in her right breast with self MLD and compression    Time 6    Period Weeks      PT LONG TERM GOAL #4   Title Pt will report she is safely able to do Pilates exercise at home being mindful of osteoporosis precautions    Time 6    Period Weeks    Status New                 Plan - 04/10/20 1322    Clinical Impression Statement Continued with manual therapy working to decrease myofascial restrictions at lumpectomy incision, along with MLD to Rt lateral breast. Then progressed HEP to include supine scpular series with red theraband (issue green next asa pt will be ready to progress quickly), and supine stabilization exercises. Pt reports being challenged by these and happy to be able to exercise again. Also educated her about safe "low and slow" progression with weights.    Personal Factors and Comorbidities Comorbidity 3+    Comorbidities previous left breast cancer, radiation to both sides of chest, osteoporosis, cervical pain with numbness in hands    Examination-Activity Limitations Carry    Stability/Clinical Decision Making Stable/Uncomplicated    Rehab Potential Excellent    PT Frequency 2x / week    PT Duration 6 weeks    PT Treatment/Interventions ADLs/Self Care Home Management;Therapeutic exercise;Therapeutic activities;Neuromuscular  re-education;Patient/family education;Orthotic Fit/Training;Manual lymph drainage;Manual techniques;Compression bandaging;Scar mobilization;Passive range of motion    PT Next Visit Plan Add bil UE 3 way raises as pt would like to learn exs with weights, issue green theraband so pt can have on hand when ready to progress at home; review HEP issued today and  cont manual therapy as doen today to decrease tightness at Rt axilla and lumpectomy incision.    PT Home Exercise Plan [email or web address] for pilates, self MLD; supine scapular series and core stabilizations    Consulted and Agree with Plan of Care Patient           Patient will benefit from skilled therapeutic intervention in order to improve the following deficits and impairments:  Decreased skin integrity, Decreased scar mobility, Decreased knowledge of precautions, Increased edema, Postural dysfunction, Pain, Impaired UE functional use, Increased fascial restricitons, Decreased strength, Decreased safety awareness, Decreased activity tolerance  Visit Diagnosis: Aftercare following surgery for neoplasm  Disorder of the skin and subcutaneous tissue related to radiation, unspecified  Muscle weakness (generalized)  Abnormal posture     Problem List Patient Active Problem List   Diagnosis Date Noted  . Genetic testing 12/05/2019  . Family history of breast cancer   . Family history of kidney cancer   . Malignant neoplasm of upper-outer quadrant of right breast in female, estrogen receptor positive ([place]) 11/21/2019  . Malignant neoplasm of upper-inner quadrant of left breast in female, estrogen receptor positive ([place]) 11/09/2012  . DIVERTICULOSIS-COLON 02/02/2010  . DYSPHAGIA UNSPECIFIED 02/02/2010  . DYSPHAGIA 02/02/2010    Otelia Limes, PTA 04/10/2020, 1:27 PM  [place], [place], [address] Phone: [phone]   Fax:  [phone]  Name: CHINA DEITRICK MRN: [id number] Date of Birth: May 31, 1951

## 2020-04-10 NOTE — Patient Instructions (Signed)
Over Head Pull: Narrow and Wide Grip   Cancer Rehab [phone]   On back, knees bent, feet flat, band across thighs, elbows straight but relaxed. Pull hands apart (start). Keeping elbows straight, bring arms up and over head, hands toward floor. Keep pull steady on band. Hold momentarily. Return slowly, keeping pull steady, back to start. Then do same with a wider grip on the band (past shoulder width) Repeat _5-10__ times. Band color __red____   Side Pull: Double Arm   On back, knees bent, feet flat. Arms perpendicular to body, shoulder level, elbows straight but relaxed. Pull arms out to sides, elbows straight. Resistance band comes across collarbones, hands toward floor. Hold momentarily. Slowly return to starting position. Repeat _5-10__ times. Band color _red____   Sword   On back, knees bent, feet flat, left hand on left hip, right hand above left. Pull right arm DIAGONALLY (hip to shoulder) across chest. Bring right arm along head toward floor. Hold momentarily. Slowly return to starting position. Repeat _5-10__ times. Do with left arm. Band color _red_____   Shoulder Rotation: Double Arm   On back, knees bent, feet flat, elbows tucked at sides, bent 90, hands palms up. Pull hands apart and down toward floor, keeping elbows near sides. Hold momentarily. Slowly return to starting position. Repeat _5-10__ times. Band color __red____    PELVIC TILT: Posterior    Tighten abdominals, flatten low back. __10_ reps per set, hold __5__ seconds,  _1-2__ sets per day.  Then holding pelvic tilt for following leg exercises:  1. Open and close bent knees like a clam 2. Alternate marching 3. Alternate heel slides or leg kicks 4. Dead bug (opposite arm and leg lift) Work towards __2-3__ sets of __10__ reps. Once these become easier you can progress to a bridge in lieu of pelvic tilt.   Then try Reverse Curl:  Bringing knees over hips and perform pelvic tilt.

## 2020-04-21 ENCOUNTER — Ambulatory Visit: Payer: Medicare PPO | Admitting: Physical Therapy

## 2020-04-21 ENCOUNTER — Other Ambulatory Visit: Payer: Self-pay

## 2020-04-21 DIAGNOSIS — Z483 Aftercare following surgery for neoplasm: Secondary | ICD-10-CM

## 2020-04-21 DIAGNOSIS — R293 Abnormal posture: Secondary | ICD-10-CM

## 2020-04-21 DIAGNOSIS — M6281 Muscle weakness (generalized): Secondary | ICD-10-CM

## 2020-04-21 DIAGNOSIS — L599 Disorder of the skin and subcutaneous tissue related to radiation, unspecified: Secondary | ICD-10-CM

## 2020-04-21 NOTE — Therapy (Signed)
[place], [place], [address] Phone: [phone]   Fax:  [phone]  Physical Therapy Treatment  Patient Details  Name: Krista Butler MRN: [id number] Date of Birth: 10-Dec-1950 Referring Provider (PT): Dr. Donne Hazel    Encounter Date: 04/21/2020   PT End of Session - 04/21/20 0823    Visit Number 6    Number of Visits 9    Date for PT Re-Evaluation 05/09/20    PT Start Time 0805    PT Stop Time 0850    PT Time Calculation (min) 45 min    Activity Tolerance Patient tolerated treatment well    Behavior During Therapy [facility] for tasks assessed/performed           Past Medical History:  Diagnosis Date  . Allergy    medicated for this  . Arm fracture    LEFT arm; as a child  . Breast cancer ([place])    left  . Diverticulosis   . Family history of breast cancer   . Family history of kidney cancer   . GERD (gastroesophageal reflux disease)   . History of radiation therapy 12/25/12-02/08/13   64.4 Gy to left breast  . Osteoporosis   . Personal history of radiation therapy   . Thyroid disease   . Wears glasses     Past Surgical History:  Procedure Laterality Date  . BREAST BIOPSY  1997   lt breast cyst  . BREAST LUMPECTOMY Left 2014  . BREAST LUMPECTOMY WITH NEEDLE LOCALIZATION Left 11/23/2012   Procedure: BREAST LUMPECTOMY WITH NEEDLE LOCALIZATION;  Surgeon: Rolm Bookbinder, MD;  Location: [facility];  Service: General;  Laterality: Left;  . BREAST LUMPECTOMY WITH RADIOACTIVE SEED AND SENTINEL LYMPH NODE BIOPSY Right 12/26/2019   Procedure: RIGHT BREAST LUMPECTOMY WITH RADIOACTIVE SEED AND RIGHT AXILLARY SENTINEL LYMPH NODE BIOPSY;  Surgeon: Rolm Bookbinder, MD;  Location: [facility];  Service: General;  Laterality: Right;  . COLONOSCOPY    . TONSILLECTOMY    . UPPER GI ENDOSCOPY  2012    There were no vitals filed for this visit.   Subjective Assessment -  04/21/20 0812    Subjective Pt has been wearing her compression bra several hours a day and has been using the chip pack some, but she has not noticed much difference at all since she started .  She says that she notices some difference in the softness in her tissues after her treatments here, but  then it comes back . She was at the beach last week, so didn't do much exercise with her arms other than stretching. She did alot walking on the beach                             [facility] Adult PT Treatment/Exercise - 04/21/20 0001      Exercises   Exercises Shoulder;Other Exercises    Other Exercises  gave pt information about long covid exercise study at [facility]       Shoulder Exercises: Sidelying   ABduction AROM;Right;5 reps   with deep breaths at the top      Manual Therapy   Manual Therapy Myofascial release;Manual Lymphatic Drainage (MLD);Passive ROM    Edema Management gave pt a small dotted foam on cellona patch to wear inside bra and adjusted straps of bra so that she could get better compression at firm areas     Manual Lymphatic  Drainage (MLD) short neck, superficial and deep abdominals, right inguinal nodes and establishment of axillo inguinal pathway, left axillary nodes and establishment of inter axillary pathway, R breast moving fluid towards pathways then retracing all steps- the to sidelyin for posterior interaxillay anastamosis , back and right lateral chest                   PT Education - 04/21/20 0902    Education Details lymphedema risk reduction,    Person(s) Educated Patient    Methods Explanation    Comprehension Verbalized understanding               PT Long Term Goals - 04/21/20 0903      PT LONG TERM GOAL #1   Title Pt will report she has an understanding of lymphedema risk reduction practices    Status Achieved      PT LONG TERM GOAL #2   Title Pt will report the fullness in her right breast is decreased by 75%    Time 6    Period  Weeks      PT LONG TERM GOAL #3   Title Pt will report she is able to manage the swelling in her right breast with self MLD and compression    Time 6    Period Weeks    Status On-going      PT LONG TERM GOAL #4   Title Pt will report she is safely able to do Pilates exercise at home being mindful of osteoporosis precautions    Time 6    Period Weeks    Status Achieved                 Plan - 04/21/20 0900    Clinical Impression Statement Pt continues to have congestion around scar and lateral chest and breast.  Tissue palpably softer after treatment.  Educated pt about lymphedema risk reduction and encouraged her to sign up for ABC class. Reviewed osteoporosis precations for exercise.    Personal Factors and Comorbidities Comorbidity 3+    Comorbidities previous left breast cancer, radiation to both sides of chest, osteoporosis, cervical pain with numbness in hands    Examination-Activity Limitations Carry    Stability/Clinical Decision Making Stable/Uncomplicated    Rehab Potential Excellent    PT Frequency 2x / week    PT Duration 6 weeks    PT Treatment/Interventions ADLs/Self Care Home Management;Therapeutic exercise;Therapeutic activities;Neuromuscular re-education;Patient/family education;Orthotic Fit/Training;Manual lymph drainage;Manual techniques;Compression bandaging;Scar mobilization;Passive range of motion    PT Next Visit Plan see how chip pack is working and do MLD if needed Teach Strength ABC program prior to d/c or Add bil UE 3 way raises as pt would like to learn exs with weights, issue green theraband so pt can have on hand when ready to progress at home; review HEP issued today and cont manual therapy as doen today to decrease tightness at Rt axilla and lumpectomy incision.    PT Home Exercise Plan [email or web address] for pilates, self MLD; supine scapular series and core stabilizations           Patient will benefit from skilled therapeutic intervention in order to  improve the following deficits and impairments:  Decreased skin integrity, Decreased scar mobility, Decreased knowledge of precautions, Increased edema, Postural dysfunction, Pain, Impaired UE functional use, Increased fascial restricitons, Decreased strength, Decreased safety awareness, Decreased activity tolerance  Visit Diagnosis: Disorder of the skin and subcutaneous tissue related to radiation, unspecified  Aftercare following surgery for  neoplasm  Muscle weakness (generalized)  Abnormal posture     Problem List Patient Active Problem List   Diagnosis Date Noted  . Genetic testing 12/05/2019  . Family history of breast cancer   . Family history of kidney cancer   . Malignant neoplasm of upper-outer quadrant of right breast in female, estrogen receptor positive ([place]) 11/21/2019  . Malignant neoplasm of upper-inner quadrant of left breast in female, estrogen receptor positive ([place]) 11/09/2012  . DIVERTICULOSIS-COLON 02/02/2010  . DYSPHAGIA UNSPECIFIED 02/02/2010  . DYSPHAGIA 02/02/2010   Donato Heinz. Owens Shark PT  Norwood Levo 04/21/2020, 9:04 AM  [place], [place], [address] Phone: [phone]   Fax:  [phone]  Name: Krista Butler MRN: [id number] Date of Birth: 05-06-1951

## 2020-04-23 ENCOUNTER — Ambulatory Visit: Payer: Medicare PPO | Admitting: Physical Therapy

## 2020-04-23 ENCOUNTER — Other Ambulatory Visit: Payer: Self-pay

## 2020-04-23 DIAGNOSIS — L599 Disorder of the skin and subcutaneous tissue related to radiation, unspecified: Secondary | ICD-10-CM

## 2020-04-23 DIAGNOSIS — M6281 Muscle weakness (generalized): Secondary | ICD-10-CM

## 2020-04-23 DIAGNOSIS — Z483 Aftercare following surgery for neoplasm: Secondary | ICD-10-CM | POA: Diagnosis not present

## 2020-04-23 DIAGNOSIS — R293 Abnormal posture: Secondary | ICD-10-CM

## 2020-04-23 NOTE — Therapy (Signed)
[place], [place], [address] Phone: [phone]   Fax:  [phone]  Physical Therapy Treatment  Patient Details  Name: Krista Butler MRN: [id number] Date of Birth: 02-23-1951 Referring Provider (PT): Dr. Donne Hazel    Encounter Date: 04/23/2020   PT End of Session - 04/23/20 1918    Visit Number 7    Number of Visits 9    Date for PT Re-Evaluation 05/09/20    PT Start Time 1400    PT Stop Time 1445    PT Time Calculation (min) 45 min    Activity Tolerance Patient tolerated treatment well    Behavior During Therapy [facility] for tasks assessed/performed           Past Medical History:  Diagnosis Date  . Allergy    medicated for this  . Arm fracture    LEFT arm; as a child  . Breast cancer ([place])    left  . Diverticulosis   . Family history of breast cancer   . Family history of kidney cancer   . GERD (gastroesophageal reflux disease)   . History of radiation therapy 12/25/12-02/08/13   64.4 Gy to left breast  . Osteoporosis   . Personal history of radiation therapy   . Thyroid disease   . Wears glasses     Past Surgical History:  Procedure Laterality Date  . BREAST BIOPSY  1997   lt breast cyst  . BREAST LUMPECTOMY Left 2014  . BREAST LUMPECTOMY WITH NEEDLE LOCALIZATION Left 11/23/2012   Procedure: BREAST LUMPECTOMY WITH NEEDLE LOCALIZATION;  Surgeon: Rolm Bookbinder, MD;  Location: [place];  Service: General;  Laterality: Left;  . BREAST LUMPECTOMY WITH RADIOACTIVE SEED AND SENTINEL LYMPH NODE BIOPSY Right 12/26/2019   Procedure: RIGHT BREAST LUMPECTOMY WITH RADIOACTIVE SEED AND RIGHT AXILLARY SENTINEL LYMPH NODE BIOPSY;  Surgeon: Rolm Bookbinder, MD;  Location: [place];  Service: General;  Laterality: Right;  . COLONOSCOPY    . TONSILLECTOMY    . UPPER GI ENDOSCOPY  2012    There were no vitals filed for this visit.   Subjective Assessment -  04/23/20 1408    Subjective Pt has been doing her self massage and stretching her shoulder. She says that she has been wearing the chip pack but it is hard to say if she feels any difference.    Pertinent History right breast cancer with lumpectomy with 6 nodes removed on 12/26/2019 followed by radiation completed 02/14/2020.  Past history of left breast cancer in 2014 with lumpectomy with no nodes removed and radiation, osteoporosis, history of pinched nerves in cervical area with tingling in hands,  pt recently had covid    Patient Stated Goals to get stronger and learn about lymphedema risk    Currently in Pain? No/denies                             [facility] Adult PT Treatment/Exercise - 04/23/20 0001      Exercises   Exercises Shoulder    Other Exercises  began instruction in Strength ABC program up to supine stretches and core work.       Shoulder Exercises: Pulleys   Flexion 2 minutes    Scaption 2 minutes      Shoulder Exercises: Therapy Ball   Flexion Both;5 reps    Flexion Limitations extra stetch at the top  Manual Therapy   Manual Therapy Manual Lymphatic Drainage (MLD)    Edema Management moved chip pack down on breast as she has had good results at scar     Manual Lymphatic Drainage (MLD) short neck, superficial and deep abdominals, right inguinal nodes and establishment of axillo inguinal pathway, left axillary nodes and establishment of inter axillary pathway, R breast moving fluid towards pathways then retracing all steps- the to sidelyin for posterior interaxillay anastamosis , back and right lateral chest                   PT Education - 04/23/20 1917    Education Details strength ABC program    Person(s) Educated Patient    Methods Explanation;Handout;Demonstration    Comprehension Need further instruction               PT Long Term Goals - 04/21/20 0903      PT LONG TERM GOAL #1   Title Pt will report she has an understanding  of lymphedema risk reduction practices    Status Achieved      PT LONG TERM GOAL #2   Title Pt will report the fullness in her right breast is decreased by 75%    Time 6    Period Weeks      PT LONG TERM GOAL #3   Title Pt will report she is able to manage the swelling in her right breast with self MLD and compression    Time 6    Period Weeks    Status On-going      PT LONG TERM GOAL #4   Title Pt will report she is safely able to do Pilates exercise at home being mindful of osteoporosis precautions    Time 6    Period Weeks    Status Achieved                 Plan - 04/23/20 1918    Clinical Impression Statement Pt has had good results from compression of chip pack and compression bra with skin much softer with less congestion around scar.  She has some fullness in her breast so foam patch was moved to that area.  Began instruction in Strength ABC with UE and LE stretches.  Pt is improving and on track to discharge after 2 more visits as planned    Personal Factors and Comorbidities Comorbidity 3+    Comorbidities previous left breast cancer, radiation to both sides of chest, osteoporosis, cervical pain with numbness in hands    Examination-Activity Limitations Carry    Stability/Clinical Decision Making Stable/Uncomplicated    Clinical Decision Making Low    Rehab Potential Excellent    PT Frequency 2x / week    PT Treatment/Interventions ADLs/Self Care Home Management;Therapeutic exercise;Therapeutic activities;Neuromuscular re-education;Patient/family education;Orthotic Fit/Training;Manual lymph drainage;Manual techniques;Compression bandaging;Scar mobilization;Passive range of motion    PT Next Visit Plan see how chip pack is working and do MLD if needed continue with  Strength ABC program prior to d/c    Consulted and Agree with Plan of Care Patient           Patient will benefit from skilled therapeutic intervention in order to improve the following deficits and  impairments:  Decreased skin integrity, Decreased scar mobility, Decreased knowledge of precautions, Increased edema, Postural dysfunction, Pain, Impaired UE functional use, Increased fascial restricitons, Decreased strength, Decreased safety awareness, Decreased activity tolerance  Visit Diagnosis: Disorder of the skin and subcutaneous tissue related to radiation, unspecified  Aftercare following surgery for neoplasm  Muscle weakness (generalized)  Abnormal posture     Problem List Patient Active Problem List   Diagnosis Date Noted  . Genetic testing 12/05/2019  . Family history of breast cancer   . Family history of kidney cancer   . Malignant neoplasm of upper-outer quadrant of right breast in female, estrogen receptor positive ([place]) 11/21/2019  . Malignant neoplasm of upper-inner quadrant of left breast in female, estrogen receptor positive ([place]) 11/09/2012  . DIVERTICULOSIS-COLON 02/02/2010  . DYSPHAGIA UNSPECIFIED 02/02/2010  . DYSPHAGIA 02/02/2010   Donato Heinz. Owens Shark PT  Krista Butler 04/23/2020, [place], [place], [address] Phone: [phone]   Fax:  [phone]  Name: Krista Butler MRN: [id number] Date of Birth: 24-Sep-1950

## 2020-04-28 ENCOUNTER — Encounter: Payer: Self-pay | Admitting: Physical Therapy

## 2020-04-28 ENCOUNTER — Other Ambulatory Visit: Payer: Self-pay

## 2020-04-28 ENCOUNTER — Ambulatory Visit: Payer: Medicare PPO | Attending: General Surgery | Admitting: Physical Therapy

## 2020-04-28 DIAGNOSIS — Z483 Aftercare following surgery for neoplasm: Secondary | ICD-10-CM | POA: Diagnosis present

## 2020-04-28 DIAGNOSIS — L599 Disorder of the skin and subcutaneous tissue related to radiation, unspecified: Secondary | ICD-10-CM | POA: Diagnosis not present

## 2020-04-28 DIAGNOSIS — M6281 Muscle weakness (generalized): Secondary | ICD-10-CM | POA: Insufficient documentation

## 2020-04-28 DIAGNOSIS — R293 Abnormal posture: Secondary | ICD-10-CM | POA: Insufficient documentation

## 2020-04-28 NOTE — Therapy (Signed)
[place], [place], [address] Phone: [phone]   Fax:  [phone]  Physical Therapy Treatment  Patient Details  Name: Krista Butler MRN: [id number] Date of Birth: [birth year] Referring Provider (PT): Dr. Donne Hazel    Encounter Date: 04/28/2020   PT End of Session - 04/28/20 1328    Visit Number 8    Number of Visits 9    Date for PT Re-Evaluation 05/09/20    PT Start Time 0900    PT Stop Time 0945    PT Time Calculation (min) 45 min    Activity Tolerance Patient tolerated treatment well    Behavior During Therapy [facility] for tasks assessed/performed           Past Medical History:  Diagnosis Date  . Allergy    medicated for this  . Arm fracture    LEFT arm; as a child  . Breast cancer ([place])    left  . Diverticulosis   . Family history of breast cancer   . Family history of kidney cancer   . GERD (gastroesophageal reflux disease)   . History of radiation therapy 12/25/12-02/08/13   64.4 Gy to left breast  . Osteoporosis   . Personal history of radiation therapy   . Thyroid disease   . Wears glasses     Past Surgical History:  Procedure Laterality Date  . BREAST BIOPSY  1997   lt breast cyst  . BREAST LUMPECTOMY Left 2014  . BREAST LUMPECTOMY WITH NEEDLE LOCALIZATION Left 11/23/2012   Procedure: BREAST LUMPECTOMY WITH NEEDLE LOCALIZATION;  Surgeon: Rolm Bookbinder, MD;  Location: [place];  Service: General;  Laterality: Left;  . BREAST LUMPECTOMY WITH RADIOACTIVE SEED AND SENTINEL LYMPH NODE BIOPSY Right 12/26/2019   Procedure: RIGHT BREAST LUMPECTOMY WITH RADIOACTIVE SEED AND RIGHT AXILLARY SENTINEL LYMPH NODE BIOPSY;  Surgeon: Rolm Bookbinder, MD;  Location: [place];  Service: General;  Laterality: Right;  . COLONOSCOPY    . TONSILLECTOMY    . UPPER GI ENDOSCOPY  2012    There were no vitals filed for this visit.   Subjective Assessment -  04/28/20 0910    Subjective Pt says she is wearing her compression bra and chip pack and states she knows how to use that to control the swelling in her breast. She feels like she has accomplished her goals for PT and is ready to start considering discharge from PT    Pertinent History right breast cancer with lumpectomy with 6 nodes removed on 12/26/2019 followed by radiation completed 02/14/2020.  Past history of left breast cancer in 2014 with lumpectomy with no nodes removed and radiation, osteoporosis, history of pinched nerves in cervical area with tingling in hands,  pt recently had covid    Patient Stated Goals to get stronger and learn about lymphedema risk    Currently in Pain? No/denies                             [facility] Adult PT Treatment/Exercise - 04/28/20 0001      Self-Care   Self-Care Other Self-Care Comments    Other Self-Care Comments  Pt had not signed up for ABC class yet to reviewed lymphedema risk reduction and pt acknowledged understading.  She does not plan on airplane travel so do not think she needs a prophylactic sleeve at this point       Exercises  Exercises Lumbar    Other Exercises  completed instruction of Stength After Breast Cancer program .  Pt had good form throughout all exercises . She has the most difficutly with quadruped " superwoman" exercises and was suggested that she do each arm and leg separately to begin to strenthen especially hip extension before she add alternating arm and leg. Extra time spent to explain how to progress resistance and reps and pt acknowledged understanding       Lumbar Exercises: Standing   Other Standing Lumbar Exercises Progress plank from elbows on wall for 30 sec, to countertop for 30 sec, to bed height for 30 seconds and pt able to do all very well. She tried plank on mat on elbows and toes and hand no dificulty for 10 sec                  PT Education - 04/28/20 1328    Education Details  lymphedema risk reduction, Strength ABC program    Person(s) Educated Patient    Methods Explanation;Demonstration    Comprehension Returned demonstration;Verbalized understanding               PT Long Term Goals - 04/28/20 0914      PT LONG TERM GOAL #1   Title Pt will report she has an understanding of lymphedema risk reduction practices    Baseline She has still to register for teh ABC Class, but precautions were reviewed    Status Achieved      PT LONG TERM GOAL #2   Title Pt will report the fullness in her right breast is decreased by 75%    Status Achieved      PT LONG TERM GOAL #3   Title Pt will report she is able to manage the swelling in her right breast with self MLD and compression    Status Achieved      PT LONG TERM GOAL #4   Title Pt will report she is safely able to do Pilates exercise at home being mindful of osteoporosis precautions    Status Achieved                 Plan - 04/28/20 1328    Clinical Impression Statement Pt is doing very well and is managing her breast fullness with chip pack and compression bra.  She was instructed in all exercise of Strength ABC program and was able to do all with good form and says she understands how to progress weights and reps. She would like to come back in about a month for recheck to make sure she had maintained the gains she has made in her lymphedema managment and to make sure the exercises are going well so discharge was deferred until then.    Comorbidities previous left breast cancer, radiation to both sides of chest, osteoporosis, cervical pain with numbness in hands    Rehab Potential Excellent    PT Frequency 2x / week    PT Duration 6 weeks    PT Treatment/Interventions ADLs/Self Care Home Management;Therapeutic exercise;Therapeutic activities;Neuromuscular re-education;Patient/family education;Orthotic Fit/Training;Manual lymph drainage;Manual techniques;Compression bandaging;Scar mobilization;Passive  range of motion    PT Next Visit Plan reassess, Discharge if no problems    PT Home Exercise Plan [id number] for pilates, self MLD; supine scapular series and core stabilizations, strength ABC program    Consulted and Agree with Plan of Care Patient           Patient will benefit from skilled therapeutic intervention  in order to improve the following deficits and impairments:  Decreased skin integrity, Decreased scar mobility, Decreased knowledge of precautions, Increased edema, Postural dysfunction, Pain, Impaired UE functional use, Increased fascial restricitons, Decreased strength, Decreased safety awareness, Decreased activity tolerance  Visit Diagnosis: Disorder of the skin and subcutaneous tissue related to radiation, unspecified  Aftercare following surgery for neoplasm  Muscle weakness (generalized)  Abnormal posture     Problem List Patient Active Problem List   Diagnosis Date Noted  . Genetic testing 12/05/2019  . Family history of breast cancer   . Family history of kidney cancer   . Malignant neoplasm of upper-outer quadrant of right breast in female, estrogen receptor positive ([place]) 11/21/2019  . Malignant neoplasm of upper-inner quadrant of left breast in female, estrogen receptor positive ([place]) 11/09/2012  . DIVERTICULOSIS-COLON 02/02/2010  . DYSPHAGIA UNSPECIFIED 02/02/2010  . DYSPHAGIA 02/02/2010   Donato Heinz. Owens Shark PT  Norwood Levo 04/28/2020, 1:32 PM  [place], [place], [address] Phone: [phone]   Fax:  [phone]  Name: Krista Butler MRN: [id number] Date of Birth: Dec 06, 1950

## 2020-04-30 ENCOUNTER — Encounter: Payer: Medicare PPO | Admitting: Physical Therapy

## 2020-05-06 ENCOUNTER — Telehealth: Payer: Self-pay

## 2020-05-06 NOTE — Telephone Encounter (Signed)
Pt called stating she would like to r/s appt with Dr. Jana Hakim on 10/19 to another date. Message sent to scheduling to arrange this.

## 2020-05-13 ENCOUNTER — Ambulatory Visit: Payer: Medicare PPO | Admitting: Oncology

## 2020-05-13 ENCOUNTER — Other Ambulatory Visit: Payer: Medicare PPO

## 2020-05-23 ENCOUNTER — Other Ambulatory Visit: Payer: Self-pay

## 2020-05-23 ENCOUNTER — Other Ambulatory Visit: Payer: Self-pay | Admitting: *Deleted

## 2020-05-23 ENCOUNTER — Inpatient Hospital Stay (HOSPITAL_BASED_OUTPATIENT_CLINIC_OR_DEPARTMENT_OTHER): Payer: Medicare PPO | Admitting: Oncology

## 2020-05-23 ENCOUNTER — Inpatient Hospital Stay: Payer: Medicare PPO | Attending: Oncology

## 2020-05-23 VITALS — BP 109/64 | HR 78 | Temp 97.4°F | Resp 18 | Ht 66.5 in | Wt 143.4 lb

## 2020-05-23 DIAGNOSIS — Z923 Personal history of irradiation: Secondary | ICD-10-CM | POA: Diagnosis not present

## 2020-05-23 DIAGNOSIS — M810 Age-related osteoporosis without current pathological fracture: Secondary | ICD-10-CM | POA: Diagnosis not present

## 2020-05-23 DIAGNOSIS — C50411 Malignant neoplasm of upper-outer quadrant of right female breast: Secondary | ICD-10-CM

## 2020-05-23 DIAGNOSIS — C50212 Malignant neoplasm of upper-inner quadrant of left female breast: Secondary | ICD-10-CM | POA: Diagnosis not present

## 2020-05-23 DIAGNOSIS — Z79811 Long term (current) use of aromatase inhibitors: Secondary | ICD-10-CM | POA: Insufficient documentation

## 2020-05-23 DIAGNOSIS — C50911 Malignant neoplasm of unspecified site of right female breast: Secondary | ICD-10-CM | POA: Diagnosis not present

## 2020-05-23 DIAGNOSIS — Z170 Estrogen receptor positive status [ER+]: Secondary | ICD-10-CM | POA: Diagnosis not present

## 2020-05-23 DIAGNOSIS — Z86000 Personal history of in-situ neoplasm of breast: Secondary | ICD-10-CM | POA: Diagnosis not present

## 2020-05-23 LAB — CMP (CANCER CENTER ONLY)
ALT: 19 U/L (ref 0–44)
AST: 26 U/L (ref 15–41)
Albumin: 3.9 g/dL (ref 3.5–5.0)
Alkaline Phosphatase: 98 U/L (ref 38–126)
Anion gap: 9 (ref 5–15)
BUN: 16 mg/dL (ref 8–23)
CO2: 24 mmol/L (ref 22–32)
Calcium: 9.3 mg/dL (ref 8.9–10.3)
Chloride: 105 mmol/L (ref 98–111)
Creatinine: 0.7 mg/dL (ref 0.44–1.00)
GFR, Estimated: >60 mL/min (ref >60)
Glucose, Bld: 90 mg/dL (ref 70–99)
Potassium: 4.4 mmol/L (ref 3.5–5.1)
Sodium: 138 mmol/L (ref 135–145)
Total Bilirubin: 0.5 mg/dL (ref 0.3–1.2)
Total Protein: 7.1 g/dL (ref 6.5–8.1)

## 2020-05-23 LAB — CBC WITH DIFFERENTIAL (CANCER CENTER ONLY)
Abs Immature Granulocytes: 0.01 10*3/uL (ref 0.00–0.07)
Basophils Absolute: 0.1 10*3/uL (ref 0.0–0.1)
Basophils Relative: 1 %
Eosinophils Absolute: 0.1 10*3/uL (ref 0.0–0.5)
Eosinophils Relative: 2 %
HCT: 39.2 % (ref 36.0–46.0)
Hemoglobin: 13.1 g/dL (ref 12.0–15.0)
Immature Granulocytes: 0 %
Lymphocytes Relative: 32 %
Lymphs Abs: 1.5 10*3/uL (ref 0.7–4.0)
MCH: 30.6 pg (ref 26.0–34.0)
MCHC: 33.4 g/dL (ref 30.0–36.0)
MCV: 91.6 fL (ref 80.0–100.0)
Monocytes Absolute: 0.5 10*3/uL (ref 0.1–1.0)
Monocytes Relative: 9 %
Neutro Abs: 2.7 10*3/uL (ref 1.7–7.7)
Neutrophils Relative %: 56 %
Platelet Count: 248 10*3/uL (ref 150–400)
RBC: 4.28 MIL/uL (ref 3.87–5.11)
RDW: 12.5 % (ref 11.5–15.5)
WBC Count: 4.8 10*3/uL (ref 4.0–10.5)
nRBC: 0 % (ref 0.0–0.2)

## 2020-05-23 NOTE — Progress Notes (Signed)
Krista Butler  [phone] [phone]    D: Krista Butler  DOB: 1951/01/30  MR#: [id number]  PZW#:[id number]   Patient Care Team: Reynold Bowen, MD as PCP - General (Endocrinology) Trevaris Pennella, [name], MD as Consulting Physician (Oncology) Kyung Rudd, MD as Consulting Physician (Radiation Oncology) Rolm Bookbinder, MD as Consulting Physician (General Surgery) Molli Posey, MD as Consulting Physician (Obstetrics and Gynecology) OTHER MD:   CHIEF COMPLAINT: new estrogen receptor positive right breast cancer; history of noninvasive left breast cancer.  CURRENT THERAPY: anastrozole   INTERVAL HISTORY: Krista Butler returns today for follow up of her newly diagnosed right breast cancer.   Since her last visit, she underwent radiation therapy from 01/21/2020 through 02/14/2020 under Dr. Lisbeth Renshaw.  She tested positive for Covid-19 on 8/5/021.  She did well with that with no residuals that she is aware of   She started anastrozole on 03/10/2020.  She has noted some hot flashes which however do not occur every day, more 2 or 3 times a week.  They do not wake her up at night.  Vaginal dryness has been an issue.  It is perhaps a little bit more of an issue now.   REVIEW OF SYSTEMS:   Krista Butler is exercising chiefly by walking.  She is not walking as much as she would like.  There are some knee issues which are holding her back.  Aside from that a detailed review of systems today was stable   COVID 19 VACCINATION STATUS:   RIGHT BREAST CANCER HISTORY: From Krista original intake note:   She underwent bilateral diagnostic mammography with tomography and right breast ultrasonography at [facility] [place] on 11/08/2019 showing: breast density category D; suspicious 2.5 cm right breast mass at 10 o'clock, muscle involvement cannot be excluded; no evidence of left breast malignancy or lymphadenopathy.  She proceeded to biopsy on 11/13/2019 of Krista right breast area in question.  Pathology from Krist[facility] procedure ([id number]) revealed: invasive mammary carcinoma, grade 2, e-cadherin negative. Prognostic indicators significant for: estrogen receptor 95% positive, progesterone receptor 95% positive, both with a strong staining intensity. Proliferation marker Ki67 of 20%. Her2 negative by immunohistochemistry (1+).   History of left breast cancer: From Dr Dana Allan earlier note:  #1Patient noted some thickening of Krista right breast. She did not have any symptoms in Krista left breast. Krista thickening did not show any discrete mass or distortion on Krista right. However Krista patient was found to have numerous bilateral calcifications. Within Krista left breast there was a new linear/branching calcifications measuring 12 mm. This was suspicious for malignancy. There are also 1.1 cm suspicious calcifications in a heterogeneous group within Krista upper inner quadrant of Krista right breast. She underwent stereotactic biopsy of both of these areas  #2The biopsy on Krista right was negative and only noted to be a fibroadenoma with associated coarse calcifications. 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  #3 patient had a MRI performed that showed a clot area of enhancement in Krista retroareolar region of Krista left breast measuring 1 cm in maximum does mention corresponding to Krista known DCIS.  #4 patient is now status post left-sided lumpectomy on 11/23/2012. Her pathology revealed DCIS grade 3. Margins were negative with Krista closest margin being 1 mm from Krista inferior posterior margins. Tumor was ER ER positive PR positive.  #5 status post completion of radiation therapy to Krista left breast on 02/08/2013. #6 patient  will proceed with after her radiation with chemoprevention with tamoxifen 20 mg daily. But this will begin after she completes radiation therapy.  #7 NSABP B. 43 clinical study enrollment. He did meet with Krista  nurse and her tissue will be sent for HER-2 testing. HER-2 negative and therefore did not enroll on B. 43 trial  #8 patient began adjuvant curative intent tamoxifen 20 mg daily starting 02/26/2013. Total of 5 years of therapy is planned.   MEDICAL HISTORY: Past Medical History:  Diagnosis Date  . Allergy    medicated for this  . Arm fracture    LEFT arm; as a child  . Breast cancer ([place])    left  . Diverticulosis   . Family history of breast cancer   . Family history of kidney cancer   . GERD (gastroesophageal reflux disease)   . History of radiation therapy 12/25/12-02/08/13   64.4 Gy to left breast  . Osteoporosis   . Personal history of radiation therapy   . Thyroid disease   . Wears glasses     SURGICAL HISTORY:  Past Surgical History:  Procedure Laterality Date  . BREAST BIOPSY  1997   lt breast cyst  . BREAST LUMPECTOMY Left 2014  . BREAST LUMPECTOMY WITH NEEDLE LOCALIZATION Left 11/23/2012   Procedure: BREAST LUMPECTOMY WITH NEEDLE LOCALIZATION;  Surgeon: Rolm Bookbinder, MD;  Location: [facility];  Service: General;  Laterality: Left;  . BREAST LUMPECTOMY WITH RADIOACTIVE SEED AND SENTINEL LYMPH NODE BIOPSY Right 12/26/2019   Procedure: RIGHT BREAST LUMPECTOMY WITH RADIOACTIVE SEED AND RIGHT AXILLARY SENTINEL LYMPH NODE BIOPSY;  Surgeon: Rolm Bookbinder, MD;  Location: [facility];  Service: General;  Laterality: Right;  . COLONOSCOPY    . TONSILLECTOMY    . UPPER GI ENDOSCOPY  2012    FAMILY HISTORY: Family History  Problem Relation Age of Onset  . Heart disease Mother   . Stroke Father   . Stroke Maternal Grandmother   . Stroke Maternal Grandfather   . Heart Problems Paternal Grandfather   . Breast cancer Cousin        dx. younger than 66 (paternal cousin)  . Cancer Cousin        dx. in her 57s, unknown type (paternal cousin)  . Kidney cancer Cousin 106       (maternal cousin)  . Other Cousin        genetic testing -  unknown results (maternal cousin)  . Colon cancer Neg Hx   . Esophageal cancer Neg Hx   . Pancreatic cancer Neg Hx   . Stomach cancer Neg Hx   . Liver disease Neg Hx    Krista patient's father died at Krista age of 84 following a stroke in Krista setting of severe heart disease. Krista patient's mother died also from heart disease at Krista age of 110. Krista patient had no brothers, one sister. There is no history of breast or ovarian cancer in Krista family.   GYN HISTORY: Menarche age 51, first live birth age 51. Krista patient is GX P2. She went through menopause in her early 72s. She took hormone replacement briefly (approximately 2 years).   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 Her husband of 40+ years, Zenia Resides, used to work for [employer] but is now a Chief Strategy Officer. Daughter Geroge Baseman is a homemaker in [place]. Son Lovey Newcomer is a tool and dye Mining engineer. Krista patient has 5 grandchildren. She has one great  grandchild. She attends Krista home church in [place].   ADVANCED DIRECTIVES: In Krista absence of any documentation to Krista contrary, Krista patient's spouse is their HCPOA.    HEALTH MAINTENANCE: Social History   Tobacco Use  . Smoking status: Never Smoker  . Smokeless tobacco: Never Used  Vaping Use  . Vaping Use: Never used  Substance Use Topics  . Alcohol use: Yes    Alcohol/week: 2.0 standard drinks    Types: 2 Glasses of wine per week    Comment: occ  . Drug use: No     Colonoscopy  Pap smear  DEXA scan  Lipid panel  Current Outpatient Medications  Medication Sig Dispense Refill  . anastrozole (ARIMIDEX) 1 MG tablet Take 1 tablet (1 mg total) by mouth daily. Start March 10, 2020 90 tablet 4  . benzonatate (TESSALON) 100 MG capsule Take 1 capsule (100 mg total) by mouth 3 (three) times daily as needed for cough. 21 capsule 0  . Biotin 1000 MCG tablet Take 1,000 mcg by mouth daily.    Marland Kitchen EPINEPHrine 0.3 mg/0.3 mL IJ SOAJ injection Inject 0.3  mLs (0.3 mg total) into Krista muscle as needed for anaphylaxis. 1 each 0  . levothyroxine (SYNTHROID, LEVOTHROID) 175 MCG tablet Take 175 mcg by mouth daily before breakfast.    . predniSONE (STERAPRED UNI-PAK 21 TAB) 10 MG (21) TBPK tablet Take by mouth daily. Take 6 tabs by mouth daily  for 2 days, then 5 tabs for 2 days, then 4 tabs for 2 days, then 3 tabs for 2 days, 2 tabs for 2 days, then 1 tab by mouth daily for 2 days 42 tablet 0  . Probiotic Product (PROBIOTIC DAILY) CAPS Take 1 capsule by mouth daily.    . traMADol (ULTRAM) 50 MG tablet Take 2 tablets (100 mg total) by mouth every 6 (six) hours as needed. 10 tablet 0  . UNABLE TO FIND Patient takes numerous supplements and OTCs including CoQ10, vit C, loratadine, pre-biotic poweder, collagen syrup    . zinc gluconate 50 MG tablet Take 50 mg by mouth daily. 30 mg     No current facility-administered medications for this visit.    PHYSICAL EXAMINATION:  white woman who appears stated age  Blood pressure 109/64, pulse 78, temperature (!) 97.4 F (36.3 C), temperature source Tympanic, resp. rate 18, height 5' 6.5" (1.689 m), weight 143 lb 6.4 oz (65 kg), SpO2 95 %. Body mass index is 22.8 kg/m.   ECOG PERFORMANCE STATUS: 1 - Symptomatic but completely ambulatory  Sclerae unicteric, EOMs intact Wearing a mask No cervical or supraclavicular adenopathy Lungs no rales or rhonchi Heart regular rate and rhythm Abd soft, nontender, positive bowel sounds MSK no focal spinal tenderness, no upper extremity lymphedema Neuro: nonfocal, well oriented, appropriate affect Breasts: Krista right breast is status post lumpectomy and radiation.  There is very minimal hyperpigmentation.  Krista cosmetic result is good.  There is no evidence of local recurrence.  Both axillae are benign.   LABORATORY DATA: Lab Results  Component Value Date   WBC 4.8 05/23/2020   HGB 13.1 05/23/2020   HCT 39.2 05/23/2020   MCV 91.6 05/23/2020   PLT 248 05/23/2020       Chemistry      Component Value Date/Time   NA 138 05/23/2020 1216   NA 142 06/13/2013 1317   K 4.4 05/23/2020 1216   K 4.0 06/13/2013 1317   CL 105 05/23/2020 1216   CL 102 11/15/2012  1224   CO2 24 05/23/2020 1216   CO2 27 06/13/2013 1317   BUN 16 05/23/2020 1216   BUN 13.0 06/13/2013 1317   CREATININE 0.70 05/23/2020 1216   CREATININE 0.7 06/13/2013 1317      Component Value Date/Time   CALCIUM 9.3 05/23/2020 1216   CALCIUM 10.1 06/13/2013 1317   ALKPHOS 98 05/23/2020 1216   ALKPHOS 81 06/13/2013 1317   AST 26 05/23/2020 1216   AST 22 06/13/2013 1317   ALT 19 05/23/2020 1216   ALT 16 06/13/2013 1317   BILITOT 0.5 05/23/2020 1216   BILITOT 0.39 06/13/2013 1317       RESULTS: No results found.   ASSESSMENT:  69 y.o. Krista Butler, [place] woman:  LEFT BREAST CANCER: (1) status post left lumpectomy 11/23/2012 for ductal carcinoma in situ, grade 3, estrogen receptor 100% positive, progesterone receptor 0% positive, with -4 very close margins  (2) considered  NSABP B-43  but was found to be HER-2 negative ([id number])  (3) completed adjuvant radiation therapy on 02/08/2013.  (4) tamoxifen 20 mg daily started August 2014, discontinued August 2019  RIGHT BREAST CANCER: (5) status post right breast upper outer quadrant biopsy 11/13/2019 for a clinical T2 N0, stage IB invasive lobular carcinoma, E-cadherin negative, estrogen and progesterone receptor positive, with an MIB-1 of 20% and no HER-2 amplification  (6) genetics testing 12/03/2019 through [facility] STAT + Multi-Cancer panels found no deleterious mutations in ATM, BRCA1, BRCA2, CDH1, CHEK2, PALB2, PTEN, STK11 and TP53. Krista Multi-Cancer Panel offered by [facility] includes sequencing and/or deletion duplication testing of Krista following 85 genes: AIP, ALK, APC, ATM, AXIN2,BAP1,  BARD1, BLM, BMPR1A, BRCA1, BRCA2, BRIP1, CASR, CDC73, CDH1, CDK4, CDKN1B, CDKN1C, CDKN2A (p14ARF), CDKN2A (p16INK4a), CEBPA, CHEK2, CTNNA1,  DICER1, DIS3L2, EGFR (c.2369C>T, p.Thr790Met variant only), EPCAM (Deletion/duplication testing only), FH, FLCN, GATA2, GPC3, GREM1 (Promoter region deletion/duplication testing only), HOXB13 (c.251G>A, p.Gly84Glu), HRAS, KIT, MAX, MEN1, MET, MITF (c.952G>A, p.Glu318Lys variant only), MLH1, MSH2, MSH3, MSH6, MUTYH, NBN, NF1, NF2, NTHL1, PALB2, PDGFRA, PHOX2B, PMS2, POLD1, POLE, POT1, PRKAR1A, PTCH1, PTEN, RAD50, RAD51C, RAD51D, RB1, RECQL4, RET, RNF43, RUNX1, SDHAF2, SDHA (sequence changes only), SDHB, SDHC, SDHD, SMAD4, SMARCA4, SMARCB1, SMARCE1, STK11, SUFU, TERC, TERT, TMEM127, TP53, TSC1, TSC2, VHL, WRN and WT1.  (7) Oncotype testing on Krista 11/13/2019 biopsy showed a score of 22, predicting a recurrence outside Krista breast in Krista next 9 years of 8% if Krista patient only systemic therapy was tamoxifen or an aromatase inhibitor for 5 years.  It also predicted no benefit from chemotherapy.  (8) status post right lumpectomy and sentinel lymph node sampling 12/26/2019 for a pT1c pN0, stage 1A invasive lobular carcinoma, with negative margins.  (a) a total of 6 axillary lymph nodes were removed.  (9) adjuvant radiation6/28/21 - 02/14/20 Site/dose:   Krista patient initially received a dose of 42.56 Gy in 16 fractions to Krista breast using whole-breast tangent fields. This was delivered using a 3-D conformal technique. Krista patient then received a boost to Krista seroma. This delivered an additional 8 Gy in 72factions using a 3 field photon technique due to Krista depth of Krista seroma. Krista total dose was 50.56 Gy.   (10) anastrozole started 03/10/2020  (a) osteoporosis: considering denosumab/Prolia   (b) referred to pelvic rehab 05/23/2020   PLAN:  Krista Butler tolerating anastrozole well and Krista plan will be to continue that a minimum of 5 years.  She does have baseline osteoporosis.  We again discussed bisphosphonates and denosumab today.  She is  thinking about denosumab/Prolia and she may receive that through her  gynecologist office or here or at her primary care physicians if she decides to pursue it.  We did discuss Krista possible toxicity side effects and complications of this agent including Krista rare possibility of osteonecrosis of Krista jaw.  I have not however had any patient on Prolia developed this complication.  I have seen it on Xgeva and zoledronate.  She is interested in Krista pelvic rehab program and I have placed Krista referral.  She is going to have her next mammogram in March.  She will return to see me in April.  At that time if everything is going well we will probably start yearly follow-up  Total encounter time 25 minutes.*      Teonia Yager, [name], MD  05/23/20 6:53 PM Medical Oncology and Hematology [facility] [place],  [address] Tel. [phone]    Fax. [phone]   I, Wilburn Mylar, am acting as scribe for Dr. [name]. Takela Varden.  I, Lurline Del MD, have reviewed Krista above documentation for accuracy and completeness, and I agree with Krista above.   *Total Encounter Time as defined by [facility] Centers for Medicare and Medicaid Services includes, in addition to Krista face-to-face time of a patient visit (documented in Krista note above) non-face-to-face time: obtaining and reviewing outside history, ordering and reviewing medications, tests or procedures, care coordination (communications with other health care professionals or caregivers) and documentation in Krista medical record.

## 2020-05-27 ENCOUNTER — Ambulatory Visit: Payer: Medicare PPO | Admitting: Physical Therapy

## 2020-06-12 ENCOUNTER — Ambulatory Visit: Payer: Medicare PPO | Admitting: Physical Therapy

## 2020-07-06 ENCOUNTER — Encounter: Payer: Self-pay | Admitting: Oncology

## 2020-07-06 ENCOUNTER — Other Ambulatory Visit: Payer: Self-pay | Admitting: Oncology

## 2020-07-30 ENCOUNTER — Other Ambulatory Visit: Payer: Self-pay

## 2020-07-30 ENCOUNTER — Ambulatory Visit: Payer: Medicare PPO | Attending: Oncology | Admitting: Physical Therapy

## 2020-07-30 ENCOUNTER — Encounter: Payer: Self-pay | Admitting: Physical Therapy

## 2020-07-30 DIAGNOSIS — C50411 Malignant neoplasm of upper-outer quadrant of right female breast: Secondary | ICD-10-CM | POA: Insufficient documentation

## 2020-07-30 DIAGNOSIS — R293 Abnormal posture: Secondary | ICD-10-CM | POA: Diagnosis present

## 2020-07-30 DIAGNOSIS — Z170 Estrogen receptor positive status [ER+]: Secondary | ICD-10-CM | POA: Insufficient documentation

## 2020-07-30 DIAGNOSIS — M6281 Muscle weakness (generalized): Secondary | ICD-10-CM | POA: Diagnosis not present

## 2020-07-30 NOTE — Patient Instructions (Addendum)
Moisturizers . They are used in the vagina to hydrate the mucous membrane that make up the vaginal canal. . Designed to keep a more normal acid balance (ph) . Once placed in the vagina, it will last between two to three days.  . Use 2-3 times per week at bedtime  . Ingredients to avoid is glycerin and fragrance, can increase chance of infection . Should not be used just before sex due to causing irritation . Most are gels administered either in a tampon-shaped applicator or as a vaginal suppository. They are non-hormonal.   Types of Moisturizers(internal use)  . Vitamin E vaginal suppositories- Whole foods, Amazon . Moist Again . Coconut oil- can break down condoms . Julva- (Do no use if on Tamoxifen) amazon . Yes moisturizer- amazon . NeuEve Silk , NeuEve Silver for menopausal or over 65 (if have severe vaginal atrophy or cancer treatments use NeuEve Silk for  1 month than move to [employer])- [employer], ShapeConsultant.com.cy . Olive and Bee intimate cream- www.oliveandbee.com.au . Mae vaginal moisturizer- Amazon . Aloe .    Creams to use externally on the Vulva area  Marathon Oil (good for for cancer patients that had radiation to the area)- [place] or [employer].https://garcia-valdez.org/  V-magic cream - amazon  Julva-amazon  Vital "V Wild Yam salve ( help moisturize and help with thinning vulvar area, does have Beeswax  MoodMaid Botanical Pro-Meno Wild Yam Cream- Amazon  Desert Harvest Gele  Cleo by Zane Herald labial moisturizer (Amazon,   Coconut or olive oil  aloe   Things to avoid in the vaginal area . Do not use things to irritate the vulvar area . No lotions just specialized creams for the vulva area- Neogyn, V-magic, No soaps; can use Aveeno or Calendula cleanser if needed. Must be gentle . No deodorants . No douches . Good to sleep without underwear to let the vaginal area to air out . No scrubbing: spread the lips to let warm water rinse over labias and pat  dry  Lubrication . Used for intercourse to reduce friction . Avoid ones that have glycerin, warming gels, tingling gels, icing or cooling gel, scented . May need to be reapplied once or several times during sexual activity . Can be applied to both partners genitals prior to vaginal penetration to minimize friction or irritation . Prevent irritation and mucosal tears that cause post coital pain and increased the risk of vaginal and urinary tract infections . Oil-based lubricants cannot be used with condoms due to breaking them down.  Least likely to irritate vaginal tissue.  . Plant based-lubes are safe . Silicone-based lubrication are thicker and last long and used for post-menopausal women Types of Lubricants . Good Clean Love (water based)-[employer], Target, Walmart, CVS . Slippery Stuff(water based) [employer] . Sylk (water based) [employer], CVS . Blossom Organics- drug store; www.blossom-organics.com . Leatrice Jewels- Drug store . Coconut oil- will breakdown condoms, least irritating . Aloe Vera- least irritating . Sliquid Natural H20 (water based)-Walgreen's, good if frequent UTI's . Wet Platinum- (Silicone) Target, Walgreen's . Yes Lubricant- Amazon . KY Jelly, Replens, and Astroglide kills good Bacteria (lactobadilli)  Things to avoid in the vaginal area . Do not use things to irritate the vulvar area . No lotions . No soaps; can use Aveeno or Calendula cleanser if needed. Must be gentle . No deodorants . No douches . Good to sleep without underwear to let the vaginal area to air out . No scrubbing: spread the lips to let warm water  rinse over labias and pat dry Access Code: [email or web address] URL: [email or web address] Date: 07/30/2020 Prepared by: Dwana Curd  Exercises Supine Diaphragmatic Breathing - 3 x daily - 7 x weekly - 1 sets - 10 reps Supine Hip Internal and External Rotation - 1 x daily - 7 x weekly - 1 sets - 10 reps - 5 sec hold Supine Pelvic Floor Stretch -  1 x daily - 7 x weekly - 1 sets - 3 reps - 30 sec hold Standing Hamstring Stretch with Step - 1 x daily - 7 x weekly - 1 sets - 3 reps - 30 sec hold

## 2020-07-30 NOTE — Therapy (Signed)
[facility] [address] [place], [place], [address] Phone: [phone]   Fax:  [phone]  Physical Therapy Evaluation  Patient Details  Name: Krista Butler MRN: [id number] Date of Birth: [birth year] Referring Provider (PT): Dr. Jana Hakim   Encounter Date: 07/30/2020   PT End of Session - 07/30/20 1447    Visit Number 1    Number of Visits 3    Date for PT Re-Evaluation 08/27/20    Authorization Type [employer] m[employer]    PT Start Time 1403    PT Stop Time 1443    PT Time Calculation (min) 40 min    Activity Tolerance Patient tolerated treatment well    Behavior During Therapy [facility] for tasks assessed/performed           Past Medical History:  Diagnosis Date  . Allergy    medicated for this  . Arm fracture    LEFT arm; as a child  . Breast cancer ([place])    left  . Diverticulosis   . Family history of breast cancer   . Family history of kidney cancer   . GERD (gastroesophageal reflux disease)   . History of radiation therapy 12/25/12-02/08/13   64.4 Gy to left breast  . Osteoporosis   . Personal history of radiation therapy   . Thyroid disease   . Wears glasses     Past Surgical History:  Procedure Laterality Date  . BREAST BIOPSY  1997   lt breast cyst  . BREAST LUMPECTOMY Left 2014  . BREAST LUMPECTOMY WITH NEEDLE LOCALIZATION Left 11/23/2012   Procedure: BREAST LUMPECTOMY WITH NEEDLE LOCALIZATION;  Surgeon: Rolm Bookbinder, MD;  Location: [place] [facility];  Service: General;  Laterality: Left;  . BREAST LUMPECTOMY WITH RADIOACTIVE SEED AND SENTINEL LYMPH NODE BIOPSY Right 12/26/2019   Procedure: RIGHT BREAST LUMPECTOMY WITH RADIOACTIVE SEED AND RIGHT AXILLARY SENTINEL LYMPH NODE BIOPSY;  Surgeon: Rolm Bookbinder, MD;  Location: [place];  Service: General;  Laterality: Right;  . COLONOSCOPY    . TONSILLECTOMY    . UPPER GI ENDOSCOPY  2012    There were no vitals filed for  this visit.    Subjective Assessment - 07/30/20 1107    Subjective Pt states she had dryness brefore using the medication but is worse now.  I feel it just nagging/burning.  Pt states she also has to get to the bathroom a little quicker. Leakage just a little with urge    Pertinent History right breast cancer with lumpectomy with 6 nodes removed on 12/26/2019 followed by radiation completed 02/14/2020.  Past history of left breast cancer in 2014 with lumpectomy with no nodes removed and radiation, osteoporosis, history of pinched nerves in cervical area with tingling in hands,  pt recently had covid    Currently in Pain? Yes    Pain Score 4     Pain Location Vagina    Pain Orientation Lower;Medial    Pain Descriptors / Indicators Discomfort;Burning    Pain Type Acute pain    Pain Radiating Towards since August 21    Pain Onset More than a month ago    Pain Frequency Intermittent    Aggravating Factors  being on meds    Effect of Pain on Daily Activities intercourse    Multiple Pain Sites No              [facility] PT Assessment - 07/30/20 0001      Assessment  Medical Diagnosis right breast cancer     Referring Provider (PT) Dr. Darnelle Catalan    Onset Date/Surgical Date --   Aug 2021   Hand Dominance Right    Prior Therapy Not pelvic floor      Precautions   Precautions Other (comment)    Precaution Comments at risk for right lymphedema , osteoporosis       Restrictions   Weight Bearing Restrictions No      Balance Screen   Has the patient fallen in the past 6 months No      Home Environment   Living Environment Private residence    Living Arrangements Spouse/significant other    Available Help at Discharge Available PRN/intermittently      Prior Function   Level of Independence Independent    Vocation Retired    Leisure regular exercises, walking, water aerobics,wants to get back to [employer]   Overall Cognitive Status Within Functional Limits for tasks  assessed      Coordination   Gross Motor Movements are Fluid and Coordinated Yes      Posture/Postural Control   Posture/Postural Control Postural limitations    Postural Limitations Rounded Shoulders;Forward head      ROM / Strength   AROM / PROM / Strength PROM      AROM   Right Shoulder Flexion --    Right Shoulder ABduction --    Left Shoulder Flexion --    Left Shoulder ABduction --      PROM   Overall PROM Comments hip ER/IR 30% limited on Rt      Strength   Overall Strength Deficits    Overall Strength Comments grossly WFL; lower abdomen reduced tone and tight/weak diaphragm      Flexibility   Soft Tissue Assessment /Muscle Length yes    Hamstrings 70% Lt; 60% Rt      Palpation   Palpation comment lumbar tightness      Ambulation/Gait   Gait Pattern Within Functional Limits                      Objective measurements completed on examination: See above findings.     Pelvic Floor Special Questions - 07/30/20 0001    Prior Pelvic/Prostate Exam Yes    Prior Pregnancies Yes    Number of Pregnancies 2    Number of Vaginal Deliveries 2    Marinoff Scale pain prevents any attempts at intercourse    Urinary Leakage Yes    How often very small drips occasionally    Urinary urgency Yes    Skin Integrity Intact   dryness   Pelvic Floor Internal Exam pt identity confirmed and internal assessment with consent    Exam Type Vaginal    Palpation fascial restriction Lt side>Rt    Strength weak squeeze, no lift    Strength # of reps --   7 quick flicks   Strength # of seconds 2    Tone high            [facility] Adult PT Treatment/Exercise - 07/30/20 0001      Self-Care   Self-Care Other Self-Care Comments    Other Self-Care Comments  moisture, lubs, intial HEP as seen in chart                  PT Education - 07/30/20 1147    Education Details Access Code: [email or web address], moisture and lub handouts  Person(s) Educated Patient    Methods  Explanation;Demonstration;Verbal cues;Handout    Comprehension Verbalized understanding;Returned demonstration            PT Short Term Goals - 07/30/20 1506      PT SHORT TERM GOAL #1   Title ind with moisturizers    Time 4    Period Weeks    Status New    Target Date 08/27/20      PT SHORT TERM GOAL #2   Title ind with intial HEP    Time 4    Period Weeks    Status New    Target Date 08/27/20             PT Long Term Goals - 07/30/20 1503      PT LONG TERM GOAL #1   Title Pt will report she has an understanding of how to stretch and care for soft tissue for pelvic floor health    Time 8    Period Weeks    Status New    Target Date 09/24/20      PT LONG TERM GOAL #2   Title Pt will be ind in HEP for posture strength and reduced risks from osteoporosis    Time 8    Period Weeks    Status New    Target Date 09/24/20      PT LONG TERM GOAL #3   Title Pt will report no pain during normal daily activities    Time 8    Period Weeks    Status New    Target Date 09/24/20      PT LONG TERM GOAL #4   Title Pt will report 1/3 at most on Marinoff scale    Baseline 3/3    Time 8    Period Weeks    Status New    Target Date 09/24/20                  Plan - 07/30/20 1448    Clinical Impression Statement Pt presents to clinic today due to vaginal dryness.  She also reports slight increase in urgency and expresses desire to strengthen to reduce risks associated with osteoporosis.  Pt has tension in Rt LE tighter more than Lt with bilat h/s and Rt glutes and piriformis tension.  Pt has some fascial restrictions in vaginal canal Lt>Rt and increased tone on the Rt side of the levators in the pelvic floor. Pt able to reduce muscle tone when cued to do diaphragmatic breathing but has a hard time coordinating due to tension in the diaphragm.  Pt has muscle weakness 2/5 and holding for 2 seconds demonstrating some reduced strength and endurance.  Vaginal dryness is  present, no observable POP.  Pt will benefit from 2 follow up visits to educate on how to strengthen and techniques on improving pelvic floor soft tissues in order to regain ability to be intimate with her husband as well as normal daily tasks without pain.    Personal Factors and Comorbidities Comorbidity 3+    Comorbidities previous left breast cancer, radiation to both sides of chest, osteoporosis, cervical pain with numbness in hands    Examination-Activity Limitations Toileting    Examination-Participation Restrictions Interpersonal Relationship;Community Activity    Stability/Clinical Decision Making Stable/Uncomplicated    Clinical Decision Making Low    Rehab Potential Excellent    PT Frequency 1x / week    PT Duration 8 weeks    PT Treatment/Interventions ADLs/Self Care Home  Management;Therapeutic exercise;Therapeutic activities;Neuromuscular re-education;Patient/family education;Orthotic Fit/Training;Manual lymph drainage;Manual techniques;Compression bandaging;Scar mobilization;Passive range of motion    PT Next Visit Plan f/u on moisture, stretches and breathing; posture and core strength; discuss dilators    PT Home Exercise Plan Access Code: [email or web address]    Consulted and Agree with Plan of Care Patient           Patient will benefit from skilled therapeutic intervention in order to improve the following deficits and impairments:  Decreased skin integrity,Decreased scar mobility,Decreased knowledge of precautions,Postural dysfunction,Pain,Increased fascial restricitons,Decreased strength,Decreased activity tolerance,Decreased coordination,Impaired flexibility  Visit Diagnosis: Abnormal posture  Muscle weakness (generalized)     Problem List Patient Active Problem List   Diagnosis Date Noted  . Genetic testing 12/05/2019  . Family history of breast cancer   . Family history of kidney cancer   . Malignant neoplasm of upper-outer quadrant of right breast in female, estrogen  receptor positive ([place]) 11/21/2019  . Malignant neoplasm of upper-inner quadrant of left breast in female, estrogen receptor positive ([place]) 11/09/2012  . DIVERTICULOSIS-COLON 02/02/2010  . DYSPHAGIA UNSPECIFIED 02/02/2010  . DYSPHAGIA 02/02/2010    Jule Ser, PT 07/30/2020, 3:13 PM  [facility] [address], [place], [place], [address] Phone: [phone]   Fax:  [phone]  Name: NAKKIA CARVAJAL MRN: [id number] Date of Birth: 06/02/1951

## 2020-08-12 ENCOUNTER — Encounter: Payer: Medicare PPO | Admitting: Physical Therapy

## 2020-08-13 ENCOUNTER — Ambulatory Visit: Payer: Medicare PPO | Admitting: Physical Therapy

## 2020-08-13 ENCOUNTER — Encounter: Payer: Self-pay | Admitting: Physical Therapy

## 2020-08-13 ENCOUNTER — Other Ambulatory Visit: Payer: Self-pay

## 2020-08-13 DIAGNOSIS — M6281 Muscle weakness (generalized): Secondary | ICD-10-CM

## 2020-08-13 DIAGNOSIS — R293 Abnormal posture: Secondary | ICD-10-CM

## 2020-08-13 DIAGNOSIS — C50411 Malignant neoplasm of upper-outer quadrant of right female breast: Secondary | ICD-10-CM | POA: Diagnosis not present

## 2020-08-13 NOTE — Therapy (Addendum)
[facility] [address] [place], [place], [address] Phone: [phone]   Fax:  [phone]  Physical Therapy Treatment  Patient Details  Name: Krista Butler MRN: [id number] Date of Birth: 1951/04/12 Referring Provider (PT): Dr. Jana Hakim   Encounter Date: 08/13/2020   PT End of Session - 08/13/20 1144    Visit Number 2    Number of Visits 3    Date for PT Re-Evaluation 08/27/20    Authorization Type [employer] m[employer]    PT Start Time 1111   late   PT Stop Time 1147    PT Time Calculation (min) 36 min    Activity Tolerance Patient tolerated treatment well    Behavior During Therapy Krista Butler for tasks assessed/performed           Past Medical History:  Diagnosis Date  . Allergy    medicated for this  . Arm fracture    LEFT arm; as a child  . Breast cancer ([place])    left  . Diverticulosis   . Family history of breast cancer   . Family history of kidney cancer   . GERD (gastroesophageal reflux disease)   . History of radiation therapy 12/25/12-02/08/13   64.4 Gy to left breast  . Osteoporosis   . Personal history of radiation therapy   . Thyroid disease   . Wears glasses     Past Surgical History:  Procedure Laterality Date  . BREAST BIOPSY  1997   lt breast cyst  . BREAST LUMPECTOMY Left 2014  . BREAST LUMPECTOMY WITH NEEDLE LOCALIZATION Left 11/23/2012   Procedure: BREAST LUMPECTOMY WITH NEEDLE LOCALIZATION;  Surgeon: Rolm Bookbinder, MD;  Location: [facility];  Service: General;  Laterality: Left;  . BREAST LUMPECTOMY WITH RADIOACTIVE SEED AND SENTINEL LYMPH NODE BIOPSY Right 12/26/2019   Procedure: RIGHT BREAST LUMPECTOMY WITH RADIOACTIVE SEED AND RIGHT AXILLARY SENTINEL LYMPH NODE BIOPSY;  Surgeon: Rolm Bookbinder, MD;  Location: [facility];  Service: General;  Laterality: Right;  . COLONOSCOPY    . TONSILLECTOMY    . UPPER GI ENDOSCOPY  2012    There were no vitals filed  for this visit.   Subjective Assessment - 08/13/20 1203    Subjective I have been using the coconut oil.  I didn't do the breathing I forgot about that.  I feel like more tensionis on my left side    Currently in Pain? No/denies                             [facility] Adult PT Treatment/Exercise - 08/13/20 0001      Self-Care   Other Self-Care Comments  dilator samples and demo shown and ordering info given      Lumbar Exercises: Stretches   Active Hamstring Stretch Right;1 rep;Left;30 seconds    Figure 4 Stretch 2 reps;30 seconds;Supine;With overpressure    Other Lumbar Stretch Exercise adductor stretch with strap      Manual Therapy   Manual Therapy Internal Pelvic Floor    Manual therapy comments identity confirmed and internal soft tissue performed    Internal Pelvic Floor levators and cavernosis group; fascial release bilat                    PT Short Term Goals - 07/30/20 1506      PT SHORT TERM GOAL #1   Title ind with moisturizers  Time 4    Period Weeks    Status New    Target Date 08/27/20      PT SHORT TERM GOAL #2   Title ind with intial HEP    Time 4    Period Weeks    Status New    Target Date 08/27/20             PT Long Term Goals - 07/30/20 1503      PT LONG TERM GOAL #1   Title Pt will report she has an understanding of how to stretch and care for soft tissue for pelvic floor health    Time 8    Period Weeks    Status New    Target Date 09/24/20      PT LONG TERM GOAL #2   Title Pt will be ind in HEP for posture strength and reduced risks from osteoporosis    Time 8    Period Weeks    Status New    Target Date 09/24/20      PT LONG TERM GOAL #3   Title Pt will report no pain during normal daily activities    Time 8    Period Weeks    Status New    Target Date 09/24/20      PT LONG TERM GOAL #4   Title Pt will report 1/3 at most on Marinoff scale    Baseline 3/3    Time 8    Period Weeks    Status New     Target Date 09/24/20                 Plan - 08/13/20 1124    Clinical Impression Statement Pt has made progress with using moisturizer and stretching the pelvic floor.  Educated on using dilators and adding more stretching.  Pt was able to tolerate internal STM and responded well with increased ST pliability.  Continue with skilled PT to work on soft tissue impairments for return to activities and ability to be intimate with her spouse    PT Treatment/Interventions ADLs/Self Care Home Management;Therapeutic exercise;Therapeutic activities;Neuromuscular re-education;Patient/family education;Orthotic Fit/Training;Manual lymph drainage;Manual techniques;Compression bandaging;Scar mobilization;Passive range of motion    PT Next Visit Plan f/u on stretching with dilators, internal STM, core strength    PT Home Exercise Plan Access Code: [email or web address]    Consulted and Agree with Plan of Care Patient           Patient will benefit from skilled therapeutic intervention in order to improve the following deficits and impairments:  Decreased skin integrity,Decreased scar mobility,Decreased knowledge of precautions,Postural dysfunction,Pain,Increased fascial restricitons,Decreased strength,Decreased activity tolerance,Decreased coordination,Impaired flexibility  Visit Diagnosis: Abnormal posture  Muscle weakness (generalized)     Problem List Patient Active Problem List   Diagnosis Date Noted  . Genetic testing 12/05/2019  . Family history of breast cancer   . Family history of kidney cancer   . Malignant neoplasm of upper-outer quadrant of right breast in female, estrogen receptor positive ([place]) 11/21/2019  . Malignant neoplasm of upper-inner quadrant of left breast in female, estrogen receptor positive ([place]) 11/09/2012  . DIVERTICULOSIS-COLON 02/02/2010  . DYSPHAGIA UNSPECIFIED 02/02/2010  . DYSPHAGIA 02/02/2010    Jule Ser, PT 08/13/2020, 12:34 PM  [facility] [address] [place], [place], [address] Phone: [phone]   Fax:  [phone]  Name: Krista [facility] MRN: [id number] Date of Birth: May 30, 1951  PHYSICAL THERAPY DISCHARGE SUMMARY  Visits  from Start of Care: 2  Current functional level related to goals / functional outcomes: See above   Remaining deficits: See above goals   Education / Equipment: HEP  Plan: Patient agrees to discharge.  Patient goals were not met. Patient is being discharged due to not returning since the last visit.  ?????    [employer], PT 10/22/20 3:22 PM

## 2020-08-13 NOTE — Patient Instructions (Addendum)
PROTOCOL FOR VAGINAL DILATORS   1. Wash dilator with soap and water prior to insertion.    2. Lay on your back reclined. Knees are to be up and apart while on your bed or in the bathtub with warm water.   3. Lubricate the end of the dilator with a water-soluble lubricant.  4. Separate the labia.   5. Tense the pelvic floor muscles than relax; while relaxing, slide lubricated dilator( round side of dilator) into the vagina.  Insert toward the direction of your spine. Dilator should feel snug and no pain more than 3/10.   6. Tense muscles again while holding the dilator so it does not get pushed out; relax and slide it in a little further.   7. Try blowing out as if filling a balloon; this may relax the muscles and allow penetration.  Repeat blowing out to insert dilator further.  8. Keep dilator in for 10 minutes if tolerate, with the pelvic floor muscles relaxed to further stretch the canal.   9. Never force the dilator into the canal. 10. Once the dilator is comfortable start to move in and out, side to side, move your hips in different directions  11. 3-4 times per week 12. Progression of dilator.  a. When you are able to place dilator into vaginal canal and feel no pain or able to move without difficulty you are ready for the next size.  b. Before you go to the next size start with the original size for 2 minutes then use the next size up for 5 minutes. c. When the next size up is easy to use, do not have to start with the smaller size.   

## 2020-08-25 ENCOUNTER — Encounter: Payer: Medicare PPO | Admitting: Physical Therapy

## 2020-11-10 ENCOUNTER — Ambulatory Visit
Admission: RE | Admit: 2020-11-10 | Discharge: 2020-11-10 | Disposition: A | Discharge status: Home / Self Care | Payer: Medicare PPO | Source: Ambulatory Visit | Attending: Oncology | Admitting: Oncology

## 2020-11-10 ENCOUNTER — Other Ambulatory Visit: Payer: Self-pay

## 2020-11-10 DIAGNOSIS — Z170 Estrogen receptor positive status [ER+]: Secondary | ICD-10-CM

## 2020-11-10 DIAGNOSIS — C50411 Malignant neoplasm of upper-outer quadrant of right female breast: Secondary | ICD-10-CM

## 2020-11-19 ENCOUNTER — Telehealth: Payer: Self-pay | Admitting: Oncology

## 2020-11-19 NOTE — Telephone Encounter (Signed)
R/s appt per 4/27 sch msg. Pt aware.  

## 2020-11-20 ENCOUNTER — Inpatient Hospital Stay: Payer: Medicare PPO

## 2020-11-20 ENCOUNTER — Inpatient Hospital Stay: Payer: Medicare PPO | Admitting: Oncology

## 2020-12-13 NOTE — Progress Notes (Signed)
[facility]  Telephone:([phone]) [phone] Fax:(336) [phone]    D: Krista Butler  DOB: 12-16-1950  MR#: [id number]  HDQ#:[id number]   Patient Care Team: Krista Bowen, MD as PCP - General (Endocrinology) Krista Butler, Krista Dad, MD as Consulting Physician (Oncology) Krista Rudd, MD as Consulting Physician (Radiation Oncology) Krista Bookbinder, MD as Consulting Physician (General Surgery) Krista Posey, MD as Consulting Physician (Obstetrics and Gynecology) OTHER MD:   CHIEF COMPLAINT: new estrogen receptor positive right breast cancer; history of noninvasive left breast cancer.  CURRENT THERAPY: anastrozole   INTERVAL HISTORY: Krista Butler returns today for follow up of her right breast cancer.   Since her last visit, she underwent bilateral diagnostic mammography with tomography at [facility] on 11/10/2020 showing: breast density category D; no evidence of malignancy in either breast.   She continues on anastrozole.  The only side effect she experiences is mild fatigue.   REVIEW OF SYSTEMS:   Krista Butler is generally doing quite well.  Her COVID infection this month was very mild.  One of her granddaughters married a former Company secretary at "the [employer]" yesterday and it was a Editor, commissioning.  She showed me the photos.  She is active and walking, and doing some household activities.  She and her husband also do line dancing.  She does some YouTube as well.  A detailed review of systems today was otherwise stable.   COVID 19 VACCINATION STATUS: infection 02/2020 and May 2022, no vaccinations   RIGHT BREAST CANCER HISTORY: From the original intake note:   She underwent bilateral diagnostic mammography with tomography and right breast ultrasonography at [facility] on 11/08/2019 showing: breast density category D; suspicious 2.5 cm right breast mass at 10 o'clock, muscle involvement cannot be excluded; no evidence of left breast malignancy or lymphadenopathy.  She proceeded to  biopsy on 11/13/2019 of the right breast area in question. Pathology from the procedure ([id number]) revealed: invasive mammary carcinoma, grade 2, e-cadherin negative. Prognostic indicators significant for: estrogen receptor 95% positive, progesterone receptor 95% positive, both with a strong staining intensity. Proliferation marker Ki67 of 20%. Her2 negative by immunohistochemistry (1+).   History of left breast cancer: From Dr Krista Butler earlier note:  #1Patient noted some thickening of the right breast. She did not have any symptoms in the left breast. The thickening did not show any discrete mass or distortion on the right. However the patient was found to have numerous bilateral calcifications. Within the left breast there was a new linear/branching calcifications measuring 12 mm. This was suspicious for malignancy. There are also 1.1 cm suspicious calcifications in a heterogeneous group within the upper inner quadrant of the right breast. She underwent stereotactic biopsy of both of these areas  #2The biopsy on the right was negative and only noted to be a fibroadenoma with associated coarse calcifications. 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  #3 patient had a MRI performed that showed a clot area of enhancement in the retroareolar region of the left breast measuring 1 cm in maximum does mention corresponding to the known DCIS.  #4 patient is now status post left-sided lumpectomy on 11/23/2012. Her pathology revealed DCIS grade 3. Margins were negative with the closest margin being 1 mm from the inferior posterior margins. Tumor was ER ER positive PR positive.  #5 status post completion of radiation therapy to the left breast on 02/08/2013. #6 patient will proceed with after her radiation  with chemoprevention with tamoxifen 20 mg daily. But this will begin after she completes radiation therapy.  #7 NSABP  B. 43 clinical study enrollment. He did meet with the nurse and her tissue will be sent for HER-2 testing. HER-2 negative and therefore did not enroll on B. 43 trial  #8 patient began adjuvant curative intent tamoxifen 20 mg daily starting 02/26/2013. Total of 5 years of therapy is planned.   MEDICAL HISTORY: Past Medical History:  Diagnosis Date  . Allergy    medicated for this  . Arm fracture    LEFT arm; as a child  . Breast cancer ([place])    left  . Diverticulosis   . Family history of breast cancer   . Family history of kidney cancer   . GERD (gastroesophageal reflux disease)   . History of radiation therapy 12/25/12-02/08/13   64.4 Gy to left breast  . Osteoporosis   . Personal history of radiation therapy   . Thyroid disease   . Wears glasses     SURGICAL HISTORY:  Past Surgical History:  Procedure Laterality Date  . BREAST BIOPSY  1997   lt breast cyst  . BREAST LUMPECTOMY Left 2014  . BREAST LUMPECTOMY WITH NEEDLE LOCALIZATION Left 11/23/2012   Procedure: BREAST LUMPECTOMY WITH NEEDLE LOCALIZATION;  Surgeon: Krista Bookbinder, MD;  Location: [facility];  Service: General;  Laterality: Left;  . BREAST LUMPECTOMY WITH RADIOACTIVE SEED AND SENTINEL LYMPH NODE BIOPSY Right 12/26/2019   Procedure: RIGHT BREAST LUMPECTOMY WITH RADIOACTIVE SEED AND RIGHT AXILLARY SENTINEL LYMPH NODE BIOPSY;  Surgeon: Krista Bookbinder, MD;  Location: [facility];  Service: General;  Laterality: Right;  . COLONOSCOPY    . TONSILLECTOMY    . UPPER GI ENDOSCOPY  2012    FAMILY HISTORY: Family History  Problem Relation Age of Onset  . Heart disease Mother   . Stroke Father   . Stroke Maternal Grandmother   . Stroke Maternal Grandfather   . Heart Problems Paternal Grandfather   . Breast cancer Cousin        dx. younger than 64 (paternal cousin)  . Cancer Cousin        dx. in her 77s, unknown type (paternal cousin)  . Kidney cancer Cousin 59       (maternal  cousin)  . Other Cousin        genetic testing - unknown results (maternal cousin)  . Colon cancer Neg Hx   . Esophageal cancer Neg Hx   . Pancreatic cancer Neg Hx   . Stomach cancer Neg Hx   . Liver disease Neg Hx   The patient's father died at the age of 72 following a stroke in the setting of severe heart disease. The patient's mother died also from heart disease at the age of 35. The patient had no brothers, one sister. There is no history of breast or ovarian cancer in the family.   GYN HISTORY: Menarche age 4, first live birth age 65. The patient is GX P2. She went through menopause in her early 41s. She took hormone replacement briefly (approximately 2 years).   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 Her husband of 40+ years, Zenia Resides, used to work for [employer] but is now a Chief Strategy Officer. Daughter Geroge Baseman is a homemaker in [place]. Son Lovey Newcomer is a tool and dye Mining engineer. The patient has 5 grandchildren. She has one great grandchild. She attends the home church in  [place].   ADVANCED DIRECTIVES: In the absence of any documentation to the contrary, the patient's spouse is their HCPOA.    HEALTH MAINTENANCE: Social History   Tobacco Use  . Smoking status: Never Smoker  . Smokeless tobacco: Never Used  Vaping Use  . Vaping Use: Never used  Substance Use Topics  . Alcohol use: Yes    Alcohol/week: 2.0 standard drinks    Types: 2 Glasses of wine per week    Comment: occ  . Drug use: No     Colonoscopy  Pap smear  DEXA scan  Lipid panel  Current Outpatient Medications  Medication Sig Dispense Refill  . anastrozole (ARIMIDEX) 1 MG tablet Take 1 tablet (1 mg total) by mouth daily. Start March 10, 2020 90 tablet 4  . Biotin 1000 MCG tablet Take 1,000 mcg by mouth daily.    Marland Kitchen levothyroxine (SYNTHROID, LEVOTHROID) 175 MCG tablet Take 175 mcg by mouth daily before breakfast.    . Probiotic Product (PROBIOTIC DAILY)  CAPS Take 1 capsule by mouth daily.    Marland Kitchen zinc gluconate 50 MG tablet Take 50 mg by mouth daily. 30 mg     No current facility-administered medications for this visit.    PHYSICAL EXAMINATION:  white Butler who appears stated age  Blood pressure (!) 108/55, pulse 76, temperature (!) 97.5 F (36.4 C), temperature source Tympanic, resp. rate 18, weight 140 lb 4.8 oz (63.6 kg), SpO2 98 %. Body mass index is 22.31 kg/m.   ECOG PERFORMANCE STATUS: 1 - Symptomatic but completely ambulatory  Sclerae unicteric, EOMs intact Wearing a mask No cervical or supraclavicular adenopathy Lungs no rales or rhonchi Heart regular rate and rhythm Abd soft, nontender, positive bowel sounds MSK no focal spinal tenderness, no upper extremity lymphedema Neuro: nonfocal, well oriented, appropriate affect Breasts: The right breast has undergone lumpectomy and radiation.  The left breast is status post remote radiation and lumpectomy.  There is no evidence of local recurrence.  Both axillae are benign.   LABORATORY DATA: Lab Results  Component Value Date   WBC 4.5 12/15/2020   HGB 12.7 12/15/2020   HCT 37.4 12/15/2020   MCV 92.6 12/15/2020   PLT 231 12/15/2020      Chemistry      Component Value Date/Time   NA 139 12/15/2020 1449   NA 142 06/13/2013 1317   K 4.2 12/15/2020 1449   K 4.0 06/13/2013 1317   CL 103 12/15/2020 1449   CL 102 11/15/2012 1224   CO2 28 12/15/2020 1449   CO2 27 06/13/2013 1317   BUN 15 12/15/2020 1449   BUN 13.0 06/13/2013 1317   CREATININE 0.91 12/15/2020 1449   CREATININE 0.7 06/13/2013 1317      Component Value Date/Time   CALCIUM 9.4 12/15/2020 1449   CALCIUM 10.1 06/13/2013 1317   ALKPHOS 108 12/15/2020 1449   ALKPHOS 81 06/13/2013 1317   AST 24 12/15/2020 1449   AST 22 06/13/2013 1317   ALT 14 12/15/2020 1449   ALT 16 06/13/2013 1317   BILITOT 0.6 12/15/2020 1449   BILITOT 0.39 06/13/2013 1317      RESULTS: No results found.   ASSESSMENT:  70 y.o.  Krista Butler, Krista Butler:  LEFT BREAST CANCER: (1) status post left lumpectomy 11/23/2012 for ductal carcinoma in situ, grade 3, estrogen receptor 100% positive, progesterone receptor 0% positive, with -4 very close margins  (2) considered  NSABP B-43  but was found to be HER-2 negative ([id number])  (3) completed  adjuvant radiation therapy on 02/08/2013.  (4) tamoxifen 20 mg daily started August 2014, discontinued August 2019  RIGHT BREAST CANCER: (5) status post right breast upper outer quadrant biopsy 11/13/2019 for a clinical T2 N0, stage IB invasive lobular carcinoma, E-cadherin negative, estrogen and progesterone receptor positive, with an MIB-1 of 20% and no HER-2 amplification  (6) genetics testing 12/03/2019 through the [facility] Breast Cancer STAT + Multi-Cancer panels found no deleterious mutations in ATM, BRCA1, BRCA2, CDH1, CHEK2, PALB2, PTEN, STK11 and TP53. The Multi-Cancer Panel offered by [facility] includes sequencing and/or deletion duplication testing of the following 85 genes: AIP, ALK, APC, ATM, AXIN2,BAP1,  BARD1, BLM, BMPR1A, BRCA1, BRCA2, BRIP1, CASR, CDC73, CDH1, CDK4, CDKN1B, CDKN1C, CDKN2A (p14ARF), CDKN2A (p16INK4a), CEBPA, CHEK2, CTNNA1, DICER1, DIS3L2, EGFR (c.2369C>T, p.Thr790Met variant only), EPCAM (Deletion/duplication testing only), FH, FLCN, GATA2, GPC3, GREM1 (Promoter region deletion/duplication testing only), HOXB13 (c.251G>A, p.Gly84Glu), HRAS, KIT, MAX, MEN1, MET, MITF (c.952G>A, p.Glu318Lys variant only), MLH1, MSH2, MSH3, MSH6, MUTYH, NBN, NF1, NF2, NTHL1, PALB2, PDGFRA, PHOX2B, PMS2, POLD1, POLE, POT1, PRKAR1A, PTCH1, PTEN, RAD50, RAD51C, RAD51D, RB1, RECQL4, RET, RNF43, RUNX1, SDHAF2, SDHA (sequence changes only), SDHB, SDHC, SDHD, SMAD4, SMARCA4, SMARCB1, SMARCE1, STK11, SUFU, TERC, TERT, TMEM127, TP53, TSC1, TSC2, VHL, WRN and WT1.  (7) Oncotype testing on the 11/13/2019 biopsy showed a score of 22, predicting a recurrence outside the breast in the next 9 years of 8%  if the patient only systemic therapy was tamoxifen or an aromatase inhibitor for 5 years.  It also predicted no benefit from chemotherapy.  (8) status post right lumpectomy and sentinel lymph node sampling 12/26/2019 for a pT1c pN0, stage 1A invasive lobular carcinoma, with negative margins.  (a) a total of 6 axillary lymph nodes were removed.  (9) adjuvant radiation6/28/21 - 02/14/20 Site/dose:   The patient initially received a dose of 42.56 Gy in 16 fractions to the breast using whole-breast tangent fields. This was delivered using a 3-D conformal technique. The patient then received a boost to the seroma. This delivered an additional 8 Gy in 27fractions using a 3 field photon technique due to the depth of the seroma. The total dose was 50.56 Gy.   (10) anastrozole started 03/10/2020  (a) osteoporosis: considering denosumab/Prolia   (b) referred to pelvic rehab 05/23/2020   PLAN:  Krista Butler is now just about 1 year out from definitive surgery for her right-sided breast cancer, with no evidence of recurrence.  This is favorable.  She is tolerating anastrozole well.  The plan is to continue that a total of 5 years.  She does have a history of osteoporosis.  She has been offered Prolia and will discuss this further with her primary care physician Dr. Forde Dandy but at this point she is hesitant to proceed.  We discussed diet and exercise issues.  We also discussed the COVID vaccination issues.  Finally we discussed her very dense breast and specifically whether to add a yearly breast MRI to her screening.  At this point she would like to think about it a little bit more.  She will have repeat mammography and bone density April of next year and return to see [place] a year from now  Total encounter time 25 minutes.*   Krista Butler, Krista Dad, MD  12/15/20 5:03 PM Medical Oncology and Hematology [facility] [place], [place] [address] Tel. [phone]    Fax. [phone]   I,  Wilburn Mylar, am acting as scribe for Dr. Virgie Butler. Krista Butler.  Krista Butler Krista Sonoda  MD, have reviewed the above documentation for accuracy and completeness, and I agree with the above.   *Total Encounter Time as defined by the Centers for Medicare and Medicaid Services includes, in addition to the face-to-face time of a patient visit (documented in the note above) non-face-to-face time: obtaining and reviewing outside history, ordering and reviewing medications, tests or procedures, care coordination (communications with other health care professionals or caregivers) and documentation in the medical record.

## 2020-12-15 ENCOUNTER — Inpatient Hospital Stay: Payer: Medicare PPO | Attending: Oncology | Admitting: Oncology

## 2020-12-15 ENCOUNTER — Inpatient Hospital Stay: Payer: Medicare PPO

## 2020-12-15 ENCOUNTER — Other Ambulatory Visit: Payer: Self-pay

## 2020-12-15 ENCOUNTER — Other Ambulatory Visit: Payer: Self-pay | Admitting: *Deleted

## 2020-12-15 VITALS — BP 108/55 | HR 76 | Temp 97.5°F | Resp 18 | Wt 140.3 lb

## 2020-12-15 DIAGNOSIS — Z170 Estrogen receptor positive status [ER+]: Secondary | ICD-10-CM

## 2020-12-15 DIAGNOSIS — C50212 Malignant neoplasm of upper-inner quadrant of left female breast: Secondary | ICD-10-CM

## 2020-12-15 DIAGNOSIS — Z803 Family history of malignant neoplasm of breast: Secondary | ICD-10-CM | POA: Insufficient documentation

## 2020-12-15 DIAGNOSIS — Z923 Personal history of irradiation: Secondary | ICD-10-CM | POA: Diagnosis not present

## 2020-12-15 DIAGNOSIS — C50411 Malignant neoplasm of upper-outer quadrant of right female breast: Secondary | ICD-10-CM | POA: Insufficient documentation

## 2020-12-15 DIAGNOSIS — Z79811 Long term (current) use of aromatase inhibitors: Secondary | ICD-10-CM | POA: Diagnosis not present

## 2020-12-15 DIAGNOSIS — Z86000 Personal history of in-situ neoplasm of breast: Secondary | ICD-10-CM | POA: Insufficient documentation

## 2020-12-15 DIAGNOSIS — M810 Age-related osteoporosis without current pathological fracture: Secondary | ICD-10-CM | POA: Insufficient documentation

## 2020-12-15 DIAGNOSIS — Z8051 Family history of malignant neoplasm of kidney: Secondary | ICD-10-CM | POA: Diagnosis not present

## 2020-12-15 LAB — CBC WITH DIFFERENTIAL (CANCER CENTER ONLY)
Abs Immature Granulocytes: 0.01 10*3/uL (ref 0.00–0.07)
Basophils Absolute: 0.1 10*3/uL (ref 0.0–0.1)
Basophils Relative: 1 %
Eosinophils Absolute: 0.1 10*3/uL (ref 0.0–0.5)
Eosinophils Relative: 2 %
HCT: 37.4 % (ref 36.0–46.0)
Hemoglobin: 12.7 g/dL (ref 12.0–15.0)
Immature Granulocytes: 0 %
Lymphocytes Relative: 33 %
Lymphs Abs: 1.5 10*3/uL (ref 0.7–4.0)
MCH: 31.4 pg (ref 26.0–34.0)
MCHC: 34 g/dL (ref 30.0–36.0)
MCV: 92.6 fL (ref 80.0–100.0)
Monocytes Absolute: 0.5 10*3/uL (ref 0.1–1.0)
Monocytes Relative: 10 %
Neutro Abs: 2.4 10*3/uL (ref 1.7–7.7)
Neutrophils Relative %: 54 %
Platelet Count: 231 10*3/uL (ref 150–400)
RBC: 4.04 MIL/uL (ref 3.87–5.11)
RDW: 12.1 % (ref 11.5–15.5)
WBC Count: 4.5 10*3/uL (ref 4.0–10.5)
nRBC: 0 % (ref 0.0–0.2)

## 2020-12-15 LAB — CMP (CANCER CENTER ONLY)
ALT: 14 U/L (ref 0–44)
AST: 24 U/L (ref 15–41)
Albumin: 4 g/dL (ref 3.5–5.0)
Alkaline Phosphatase: 108 U/L (ref 38–126)
Anion gap: 8 (ref 5–15)
BUN: 15 mg/dL (ref 8–23)
CO2: 28 mmol/L (ref 22–32)
Calcium: 9.4 mg/dL (ref 8.9–10.3)
Chloride: 103 mmol/L (ref 98–111)
Creatinine: 0.91 mg/dL (ref 0.44–1.00)
GFR, Estimated: >60 mL/min (ref >60)
Glucose, Bld: 98 mg/dL (ref 70–99)
Potassium: 4.2 mmol/L (ref 3.5–5.1)
Sodium: 139 mmol/L (ref 135–145)
Total Bilirubin: 0.6 mg/dL (ref 0.3–1.2)
Total Protein: 7.3 g/dL (ref 6.5–8.1)

## 2020-12-16 ENCOUNTER — Telehealth: Payer: Self-pay | Admitting: Oncology

## 2020-12-16 NOTE — Telephone Encounter (Signed)
Scheduled appointment per 05/23 los. Left a detailed message.

## 2021-01-06 ENCOUNTER — Other Ambulatory Visit: Payer: Self-pay | Admitting: *Deleted

## 2021-01-06 DIAGNOSIS — Z170 Estrogen receptor positive status [ER+]: Secondary | ICD-10-CM

## 2021-01-06 NOTE — Progress Notes (Signed)
Received vm from pt stating she has decided to purse breast MRI per Dr. Jana Hakim recommendations. Orders placed, pre-auth request sent.

## 2021-01-14 ENCOUNTER — Telehealth: Payer: Self-pay

## 2021-01-14 ENCOUNTER — Other Ambulatory Visit: Payer: Self-pay

## 2021-01-14 ENCOUNTER — Ambulatory Visit
Admission: RE | Admit: 2021-01-14 | Discharge: 2021-01-14 | Disposition: A | Discharge status: Home / Self Care | Payer: Medicare PPO | Source: Ambulatory Visit | Attending: Oncology | Admitting: Oncology

## 2021-01-14 DIAGNOSIS — C50411 Malignant neoplasm of upper-outer quadrant of right female breast: Secondary | ICD-10-CM

## 2021-01-14 MED ORDER — GADOBUTROL 1 MMOL/ML IV SOLN
6.0000 mL | Freq: Once | INTRAVENOUS | Status: AC | PRN
  Administered 2021-01-14: 09:00:00 6 mL via INTRAVENOUS

## 2021-01-14 NOTE — Telephone Encounter (Signed)
-----   Message from Gardenia Phlegm, NP sent at 01/14/2021 10:59 AM EDT ----- MRI is normal, no breast malignancy or concern noted. Please notify patient of the good news!   ----- Message ----- From: Interface, Rad Results In Sent: 01/14/2021  10:52 AM EDT To: Chauncey Cruel, MD

## 2021-01-14 NOTE — Telephone Encounter (Signed)
Pt was called and given results. Pt verbalized thanks and understanding.

## 2021-01-31 ENCOUNTER — Encounter (HOSPITAL_COMMUNITY): Payer: Self-pay

## 2021-02-16 ENCOUNTER — Other Ambulatory Visit: Payer: Self-pay | Admitting: Oncology

## 2021-03-21 ENCOUNTER — Other Ambulatory Visit: Payer: Self-pay | Admitting: Oncology

## 2021-04-24 ENCOUNTER — Other Ambulatory Visit (HOSPITAL_COMMUNITY)
Admission: RE | Admit: 2021-04-24 | Discharge: 2021-04-24 | Disposition: A | Discharge status: Home / Self Care | Payer: Medicare PPO | Source: Other Acute Inpatient Hospital | Attending: General Surgery | Admitting: General Surgery

## 2021-04-24 DIAGNOSIS — Z029 Encounter for administrative examinations, unspecified: Secondary | ICD-10-CM | POA: Insufficient documentation

## 2021-04-24 LAB — CBC WITH DIFFERENTIAL/PLATELET
Abs Immature Granulocytes: 0.01 10*3/uL (ref 0.00–0.07)
Basophils Absolute: 0 10*3/uL (ref 0.0–0.1)
Basophils Relative: 1 %
Eosinophils Absolute: 0.1 10*3/uL (ref 0.0–0.5)
Eosinophils Relative: 1 %
HCT: 42 % (ref 36.0–46.0)
Hemoglobin: 14.2 g/dL (ref 12.0–15.0)
Immature Granulocytes: 0 %
Lymphocytes Relative: 33 %
Lymphs Abs: 1.7 10*3/uL (ref 0.7–4.0)
MCH: 31.3 pg (ref 26.0–34.0)
MCHC: 33.8 g/dL (ref 30.0–36.0)
MCV: 92.7 fL (ref 80.0–100.0)
Monocytes Absolute: 0.4 10*3/uL (ref 0.1–1.0)
Monocytes Relative: 8 %
Neutro Abs: 2.8 10*3/uL (ref 1.7–7.7)
Neutrophils Relative %: 57 %
Platelets: 237 10*3/uL (ref 150–400)
RBC: 4.53 MIL/uL (ref 3.87–5.11)
RDW: 11.9 % (ref 11.5–15.5)
WBC: 5 10*3/uL (ref 4.0–10.5)
nRBC: 0 % (ref 0.0–0.2)

## 2021-04-24 LAB — COMPREHENSIVE METABOLIC PANEL
ALT: 20 U/L (ref 0–44)
AST: 25 U/L (ref 15–41)
Albumin: 4 g/dL (ref 3.5–5.0)
Alkaline Phosphatase: 92 U/L (ref 38–126)
Anion gap: 6 (ref 5–15)
BUN: 13 mg/dL (ref 8–23)
CO2: 27 mmol/L (ref 22–32)
Calcium: 9.3 mg/dL (ref 8.9–10.3)
Chloride: 103 mmol/L (ref 98–111)
Creatinine, Ser: 0.65 mg/dL (ref 0.44–1.00)
GFR, Estimated: >60 mL/min (ref >60)
Glucose, Bld: 90 mg/dL (ref 70–99)
Potassium: 4.2 mmol/L (ref 3.5–5.1)
Sodium: 136 mmol/L (ref 135–145)
Total Bilirubin: 0.7 mg/dL (ref 0.3–1.2)
Total Protein: 7.1 g/dL (ref 6.5–8.1)

## 2021-04-24 LAB — MAGNESIUM: Magnesium: 2.2 mg/dL (ref 1.7–2.4)

## 2021-04-24 LAB — TROPONIN I (HIGH SENSITIVITY): Troponin I (High Sensitivity): <2 ng/L (ref <18)

## 2021-04-24 LAB — C-REACTIVE PROTEIN: CRP: 0.6 mg/dL (ref <1.0)

## 2021-04-24 LAB — CK: Total CK: 91 U/L (ref 38–234)

## 2021-04-25 ENCOUNTER — Other Ambulatory Visit: Payer: Self-pay | Admitting: Oncology

## 2021-04-28 ENCOUNTER — Other Ambulatory Visit: Payer: Self-pay | Admitting: Oncology

## 2021-04-28 LAB — LACTATE DEHYDROGENASE, ISOENZYMES
LDH 1: 25 % (ref 17–32)
LDH 2: 34 % (ref 25–40)
LDH 3: 23 % (ref 17–27)
LDH 4: 10 % (ref 5–13)
LDH 5: 8 % (ref 4–20)
LDH Isoenzymes, Total: 162 IU/L (ref 119–226)

## 2021-06-06 IMAGING — MG DIGITAL DIAGNOSTIC BILAT W/ TOMO W/ CAD
8 of 14 series · 8 of 40 positions shown · non-contrast
Comparison: Previous exam(s).

CLINICAL DATA: 69-year-old female presenting for evaluation of a
palpable lump in her right breast. She has history of left breast
cancer in 7694 status post lumpectomy and radiation therapy.

EXAM:
DIGITAL DIAGNOSTIC BILATERAL MAMMOGRAM WITH CAD AND TOMO
ULTRASOUND RIGHT BREAST

[R CC synth-2D]
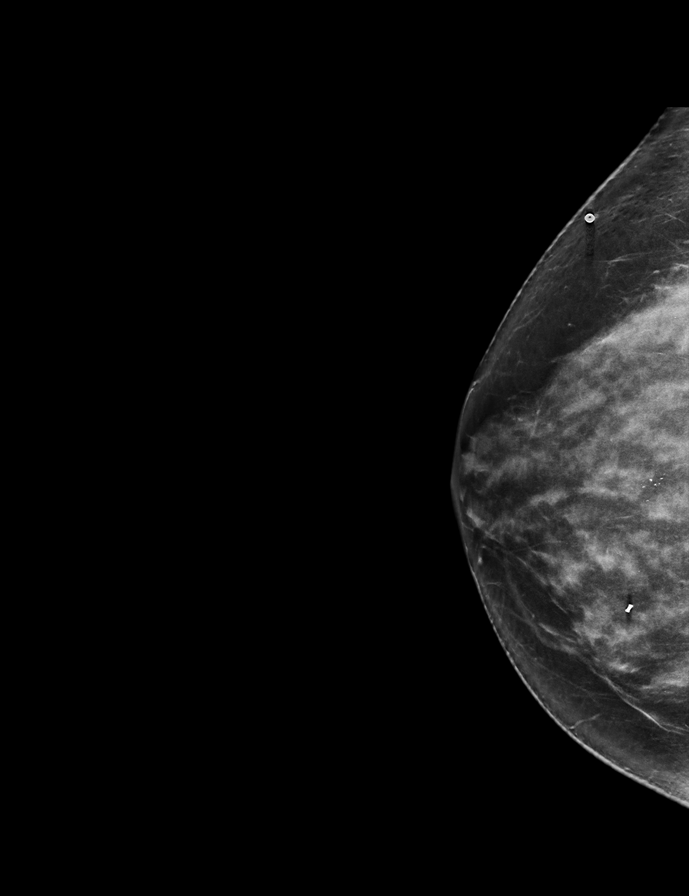

[R XCCL synth-2D]
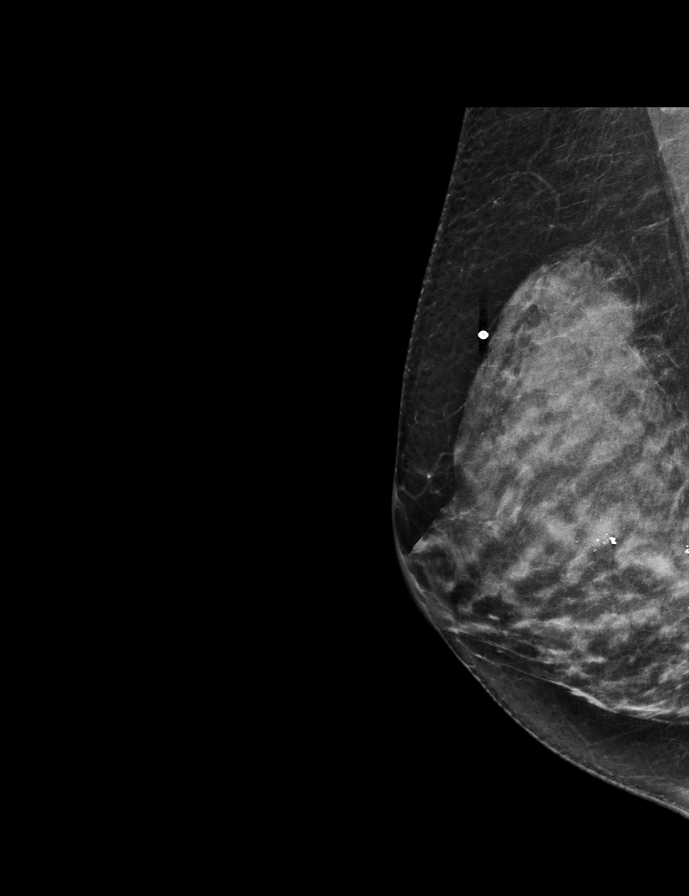

[R MLO synth-2D (1 of 2)]
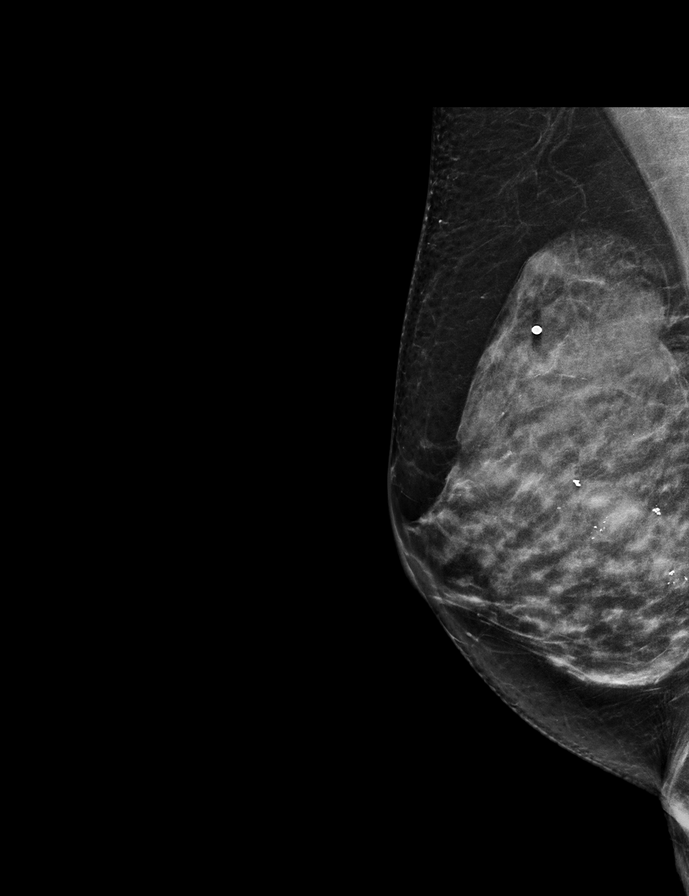

[R MLO synth-2D (2 of 2)]
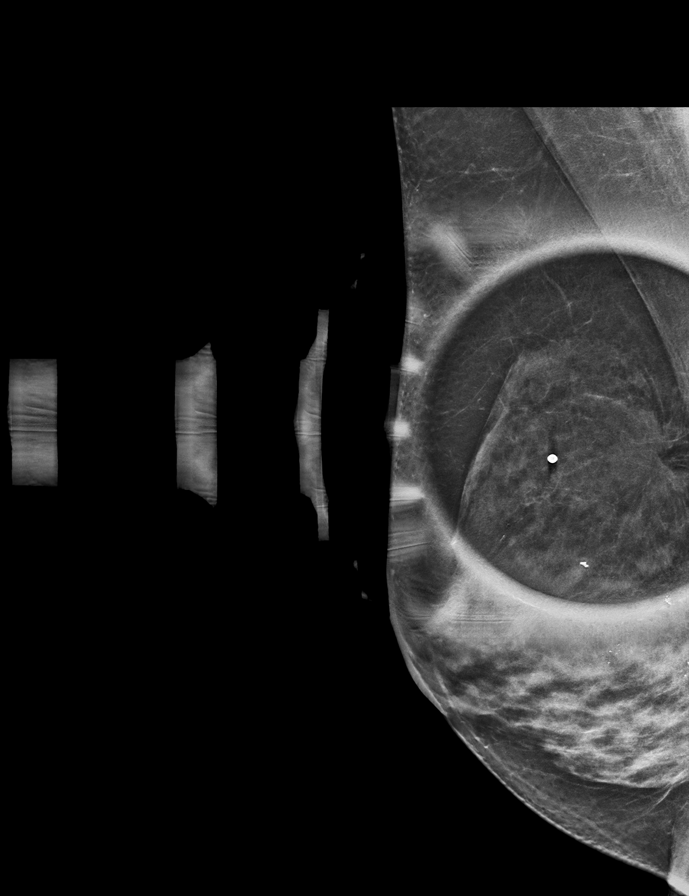

[L MLO synth-2D]
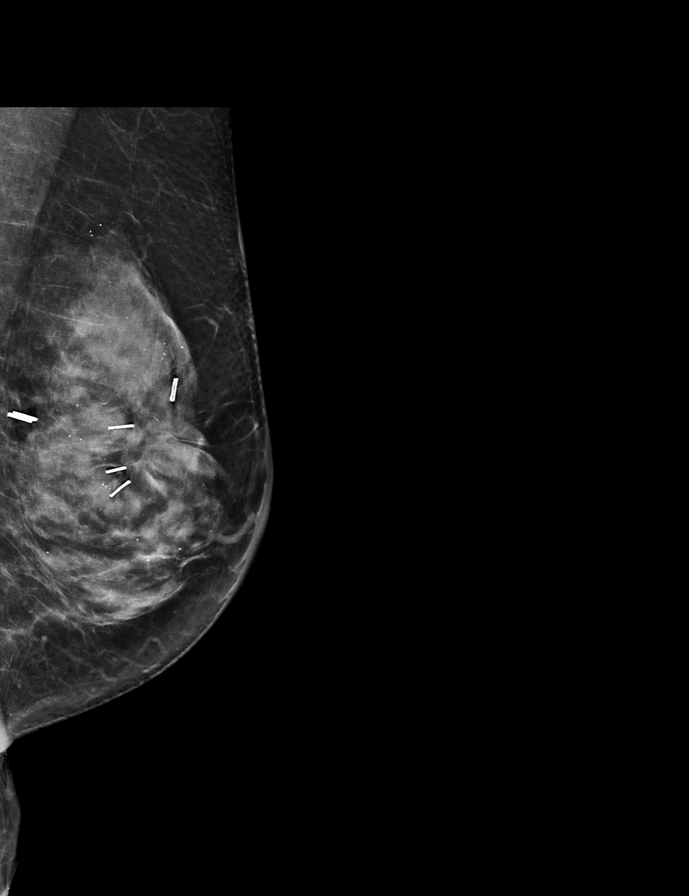

[R TAN synth-2D]
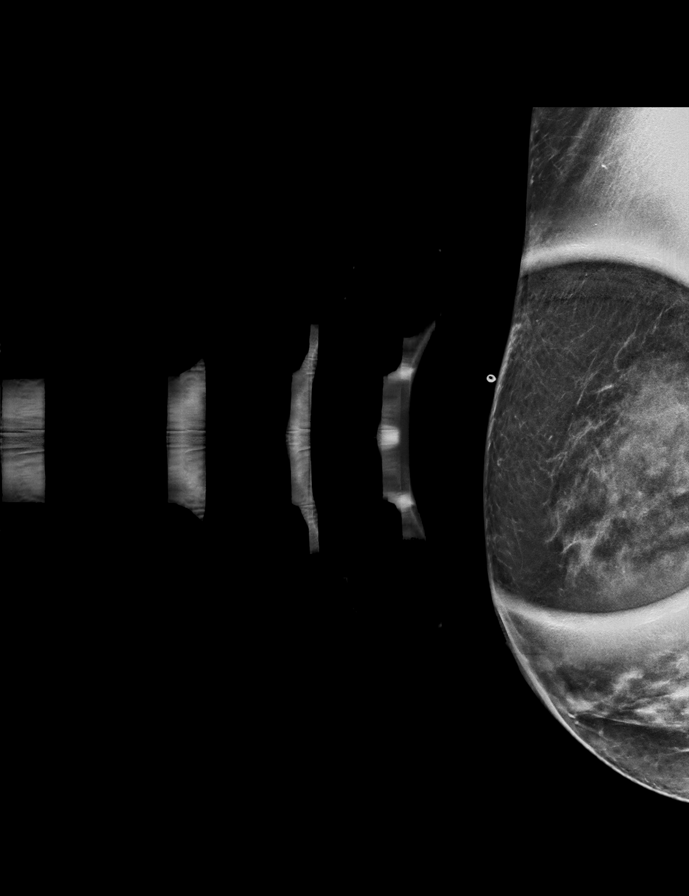

[L CC synth-2D]
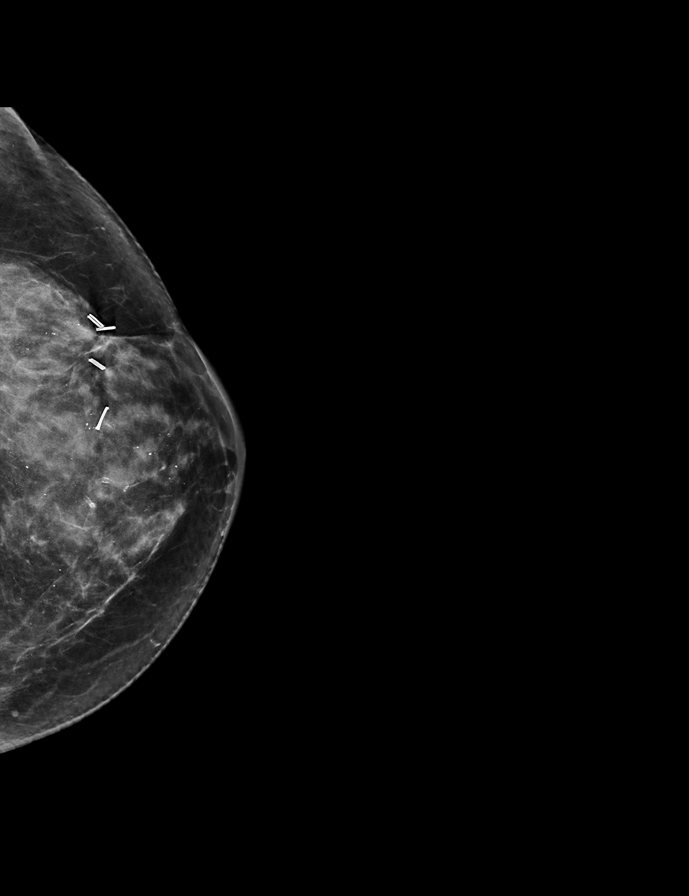

[R TAN tomo · tomo slice 31/60.0]
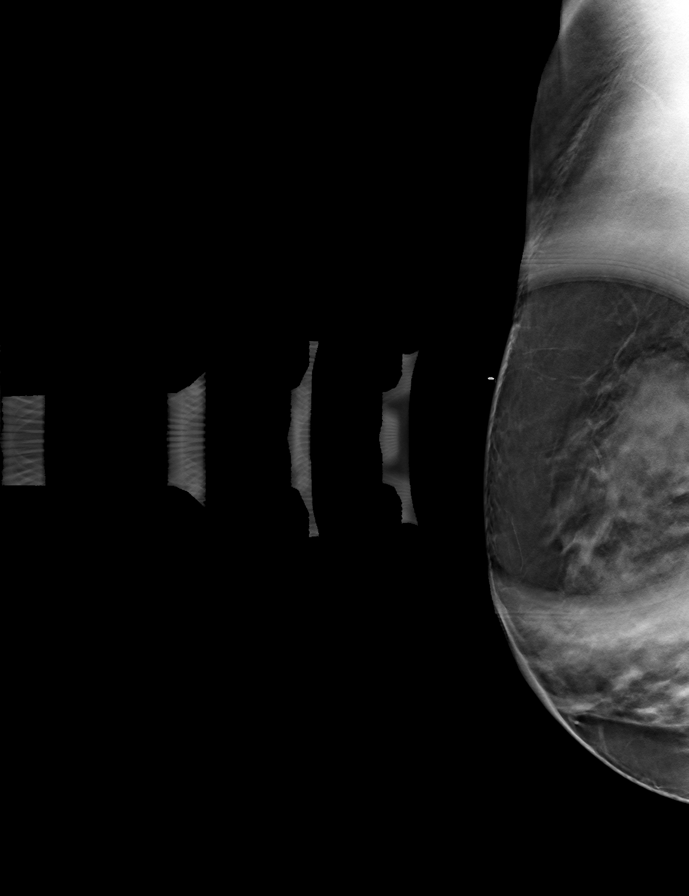

[8 of 40 positions shown; findings below may reference images not displayed]

ACR Breast Density Category d: The breast tissue is extremely dense,
which lowers the sensitivity of mammography.
FINDINGS: A BB has been placed on the upper-outer quadrant of the right breast
indicating the palpable area of concern. Deep to this palpable
marker, there is an area of distortion with a possible obscured
mass. The left breast lumpectomy site appears stable. No other
suspicious calcifications, masses or areas of distortion are seen in
the bilateral breasts.

Mammographic images were processed with CAD.

Ultrasound targeted to the palpable site in the right breast at 10
o'clock, 5 cm from the nipple demonstrates an irregular hypoechoic
mass measuring 2.5 x 1.6 x 2.4 cm. The mass abuts the underlying
musculature, raising the possibility of muscle involvement. On the
spot compression tomosynthesis mammogram images, there does appear
to be a preserved fat plane between the mass and the underlying
muscle, however the inferior posterior margin was not included in
the field of view due to the far posterior positioning.

Ultrasound of the right axilla demonstrates multiple
normal-appearing lymph nodes.
IMPRESSION: 1. There is a highly suspicious mass in the right breast at 10
o'clock. The muscle abuts the underlying musculature on ultrasound.
Muscle involvement cannot be excluded.

2.  No evidence of right axillary lymphadenopathy.

3. Stable left breast lumpectomy site. No mammographic evidence of
malignancy in the left breast.

RECOMMENDATION:
1. Ultrasound-guided biopsy is recommended for the right breast mass
at 10 o'clock. This has been scheduled for 11/13/2019 at [DATE] a.m.

2. Following biopsy, breast MRI with and without contrast is
recommended to exclude chest wall involvement, and for extent of
disease given the extreme density of the patient's breast tissue,
and screening of the contralateral breast given the patient's prior
history of breast cancer in 7694.

I have discussed the findings and recommendations with the patient.
If applicable, a reminder letter will be sent to the patient
regarding the next appointment.

BI-RADS CATEGORY  5: Highly suggestive of malignancy.

## 2021-06-23 ENCOUNTER — Other Ambulatory Visit: Payer: Self-pay | Admitting: Oncology

## 2021-06-25 ENCOUNTER — Other Ambulatory Visit: Payer: Self-pay | Admitting: Oncology

## 2021-06-25 ENCOUNTER — Other Ambulatory Visit: Payer: Self-pay | Admitting: *Deleted

## 2021-06-25 DIAGNOSIS — C50411 Malignant neoplasm of upper-outer quadrant of right female breast: Secondary | ICD-10-CM

## 2021-06-25 DIAGNOSIS — M810 Age-related osteoporosis without current pathological fracture: Secondary | ICD-10-CM

## 2021-06-25 MED ORDER — ANASTROZOLE 1 MG PO TABS: 1.0000 mg | ORAL_TABLET | Freq: Every day | ORAL | 4 refills | Status: DC

## 2021-08-26 ENCOUNTER — Encounter: Payer: Self-pay | Admitting: Internal Medicine

## 2021-08-26 ENCOUNTER — Ambulatory Visit (INDEPENDENT_AMBULATORY_CARE_PROVIDER_SITE_OTHER): Payer: Medicare PPO | Admitting: Internal Medicine

## 2021-08-26 VITALS — BP 92/60 | HR 88 | Wt 143.1 lb

## 2021-08-26 DIAGNOSIS — R490 Dysphonia: Secondary | ICD-10-CM

## 2021-08-26 DIAGNOSIS — J029 Acute pharyngitis, unspecified: Secondary | ICD-10-CM | POA: Diagnosis not present

## 2021-08-26 DIAGNOSIS — R059 Cough, unspecified: Secondary | ICD-10-CM

## 2021-08-26 DIAGNOSIS — K219 Gastro-esophageal reflux disease without esophagitis: Secondary | ICD-10-CM | POA: Diagnosis not present

## 2021-08-26 MED ORDER — PANTOPRAZOLE SODIUM 40 MG PO TBEC: 40.0000 mg | DELAYED_RELEASE_TABLET | Freq: Two times a day (BID) | ORAL | 3 refills | Status: DC

## 2021-08-26 NOTE — Patient Instructions (Addendum)
If you are age 73 or older, your body mass index should be between 23-30. Your Body mass index is 22.75 kg/m. If this is out of the aforementioned range listed, please consider follow up with your Primary Care Provider.  If you are age 3 or younger, your body mass index should be between 19-25. Your Body mass index is 22.75 kg/m. If this is out of the aformentioned range listed, please consider follow up with your Primary Care Provider.   ________________________________________________________  The [facility] providers would like to encourage you to use [facility] to communicate with providers for non-urgent requests or questions.  Due to long hold times on the telephone, sending your provider a message by [facility] may be a faster and more efficient way to get a response.  Please allow 48 business hours for a response.  Please remember that this is for non-urgent requests.  _______________________________________________________  Dennis Bast have been referred to [facility].  They will contact you to set up an appointment.  We have sent the following medications to your pharmacy for you to pick up at your convenience:  Pantoprazole  Please follow up in three months

## 2021-08-26 NOTE — Progress Notes (Signed)
HISTORY OF PRESENT ILLNESS:  Krista Butler is a 71 y.o. female with a history of breast cancer, hypothyroidism, osteoporosis, and GERD who was sent today by her primary care provider's office regarding chronic cough.  The patient was last seen in this office January 24, 2017 regarding colon cancer screening.  At that time she was noted to have a history of GERD which she treated successfully with on-demand PPI.  She did have an upper endoscopy in August 2011 which was negative except for small antral erosions (Helicobacter pylori negative).  She subsequently underwent complete colonoscopy April 19, 2017.  She was found to have diverticulosis, small internal hemorrhoids, and a diminutive colon polyp.  Follow-up in 10 years recommended.  The patient tells me that she was in her usual state of health until May 2022 when she developed COVID.  Thereafter she reports problems with chronic cough.  Describes a tickling sensation in her throat as well as drainage.  She does have a history of sinus allergies.  She is also been experiencing intermittent sore throat and intermittent hoarseness.  She was treated with Singulair which did not help.  Chest x-ray was negative.  The course of antibiotics did not help.  July 22, 2021 she was seen by the nurse practitioner who felt her symptoms were "silent GERD".  She was placed on pantoprazole 40 mg twice daily.  Initially, she thought this was helpful.  However, symptoms have recurred.  Overall she states that the severity and frequency of her coughing is about the same.  She coughs multiple times every day.  She does use cough drops.  She takes Mucinex.  She denies dysphagia.  Previous endoscopy as described.  No lower GI complaints.  Review of blood work from September 2022 shows unremarkable comprehensive metabolic panel.  Normal CBC with hemoglobin 14.2  REVIEW OF SYSTEMS:  All non-GI ROS negative unless otherwise stated in the HPI except for (sore throat,  hoarseness, sinus and allergy as noted above)  Past Medical History:  Diagnosis Date   Allergy    medicated for this   Arm fracture    LEFT arm; as a child   Breast cancer ([place])    left   Diverticulosis    Family history of breast cancer    Family history of kidney cancer    GERD (gastroesophageal reflux disease)    History of radiation therapy 12/25/12-02/08/13   64.4 Gy to left breast   Osteoporosis    Personal history of radiation therapy    Thyroid disease    Wears glasses     Past Surgical History:  Procedure Laterality Date   BREAST BIOPSY  1997   lt breast cyst   BREAST LUMPECTOMY Left 2014   BREAST LUMPECTOMY WITH NEEDLE LOCALIZATION Left 11/23/2012   Procedure: BREAST LUMPECTOMY WITH NEEDLE LOCALIZATION;  Surgeon: Rolm Bookbinder, MD;  Location: [place];  Service: General;  Laterality: Left;   BREAST LUMPECTOMY WITH RADIOACTIVE SEED AND SENTINEL LYMPH NODE BIOPSY Right 12/26/2019   Procedure: RIGHT BREAST LUMPECTOMY WITH RADIOACTIVE SEED AND RIGHT AXILLARY SENTINEL LYMPH NODE BIOPSY;  Surgeon: Rolm Bookbinder, MD;  Location: [place];  Service: General;  Laterality: Right;   COLONOSCOPY     TONSILLECTOMY     UPPER GI ENDOSCOPY  2012    Social History Krista Butler  reports that she has never smoked. She has never used smokeless tobacco. She reports current alcohol use of about 2.0 standard drinks per week. She  reports that she does not use drugs.  family history includes Breast cancer in her cousin; Cancer in her cousin; Depression in her sister; Diabetes in her father; Heart Problems in her paternal grandfather; Heart disease in her father and mother; Hyperlipidemia in her sister; Hypertension in her mother; Kidney cancer (age of onset: 44) in her cousin; Other in her cousin; Stroke in her father, maternal grandfather, and maternal grandmother.  Allergies  Allergen Reactions   Sulfa Antibiotics Cough   Percocet  [Oxycodone-Acetaminophen]     Itching and flushed face       PHYSICAL EXAMINATION: Vital signs: BP 92/60 (BP Location: Left Arm, Patient Position: Sitting, Cuff Size: Normal)    Pulse 88    Wt 143 lb 2 oz (64.9 kg)    BMI 22.75 kg/m   Constitutional: generally well-appearing, no acute distress Psychiatric: alert and oriented x3, cooperative Eyes: extraocular movements intact, anicteric, conjunctiva pink Mouth: oral pharynx moist, no lesions.  No posterior pharyngeal lesions Neck: supple no lymphadenopathy Cardiovascular: heart regular rate and rhythm, no murmur Lungs: clear to auscultation bilaterally Abdomen: soft, nontender, nondistended, no obvious ascites, no peritoneal signs, normal bowel sounds, no organomegaly Rectal: Omitted Extremities: no clubbing, cyanosis, or lower extremity edema bilaterally Skin: no lesions on visible extremities Neuro: No focal deficits.  Cranial nerves intact  ASSESSMENT:  1.  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  Can last up to 6 months or longer. 2.  GERD.  Known to have GERD.  If cough is related to GERD, then high-dose PPI for longer period of time should be helpful.  Unremarkable EGD 2011 3.  Complaints of intermittent sore throat and hoarseness along with cough.  Low likelihood of anything serious but ENT and pulmonary assessments would be reasonable given the chronicity and lack of improvement. 4.  Colonoscopy September 2018 as previously described  PLAN:  1.  Continue pantoprazole 40 mg twice daily, for now.  Prescription refilled.  Medication risks reviewed. 2.  ENT referral regarding intermittent sore throat and hoarseness along with chronic cough. 3.  Pulmonary evaluation.  Chronic cough post-COVID 4.  GI office follow-up in 3 months.  We will reassess her symptoms on high-dose PPI after the above evaluations completed.  Further impressions and recommendations at that time. 5.  Routine follow-up colonoscopy around  2028 6.  Ongoing general medical care with Dr. Forde Dandy A total time of 45 minutes was spent preparing to see the patient, reviewing outside x-rays and laboratory test.  Attaining comprehensive history, performing medically appropriate physical exam, counseling the patient regarding the above listed issues, ordering medications and directing additional specialty referrals, arranging follow-up, and documenting clinical information in the health record

## 2021-11-17 ENCOUNTER — Other Ambulatory Visit: Payer: Self-pay | Admitting: Hematology and Oncology

## 2021-11-17 ENCOUNTER — Other Ambulatory Visit: Payer: Self-pay | Admitting: Oncology

## 2021-11-17 DIAGNOSIS — M810 Age-related osteoporosis without current pathological fracture: Secondary | ICD-10-CM

## 2021-11-17 DIAGNOSIS — C50411 Malignant neoplasm of upper-outer quadrant of right female breast: Secondary | ICD-10-CM

## 2021-11-17 DIAGNOSIS — C50212 Malignant neoplasm of upper-inner quadrant of left female breast: Secondary | ICD-10-CM

## 2021-12-08 ENCOUNTER — Ambulatory Visit
Admission: RE | Admit: 2021-12-08 | Discharge: 2021-12-08 | Disposition: A | Discharge status: Home / Self Care | Payer: Medicare PPO | Source: Ambulatory Visit | Attending: Hematology and Oncology | Admitting: Hematology and Oncology

## 2021-12-08 DIAGNOSIS — C50411 Malignant neoplasm of upper-outer quadrant of right female breast: Secondary | ICD-10-CM

## 2021-12-08 DIAGNOSIS — C50212 Malignant neoplasm of upper-inner quadrant of left female breast: Secondary | ICD-10-CM

## 2021-12-16 ENCOUNTER — Inpatient Hospital Stay: Payer: Medicare PPO

## 2021-12-16 ENCOUNTER — Inpatient Hospital Stay: Payer: Medicare PPO | Admitting: Hematology and Oncology

## 2022-01-13 ENCOUNTER — Other Ambulatory Visit: Payer: Self-pay | Admitting: *Deleted

## 2022-01-13 DIAGNOSIS — C50411 Malignant neoplasm of upper-outer quadrant of right female breast: Secondary | ICD-10-CM

## 2022-01-13 DIAGNOSIS — C50212 Malignant neoplasm of upper-inner quadrant of left female breast: Secondary | ICD-10-CM

## 2022-01-14 ENCOUNTER — Encounter: Payer: Self-pay | Admitting: Hematology and Oncology

## 2022-01-14 ENCOUNTER — Inpatient Hospital Stay (HOSPITAL_BASED_OUTPATIENT_CLINIC_OR_DEPARTMENT_OTHER): Payer: Medicare PPO | Admitting: Hematology and Oncology

## 2022-01-14 ENCOUNTER — Other Ambulatory Visit: Payer: Self-pay

## 2022-01-14 ENCOUNTER — Inpatient Hospital Stay: Payer: Medicare PPO | Attending: Hematology and Oncology

## 2022-01-14 VITALS — BP 118/60 | HR 77 | Temp 97.8°F | Resp 16 | Ht 66.0 in | Wt 142.8 lb

## 2022-01-14 DIAGNOSIS — Z170 Estrogen receptor positive status [ER+]: Secondary | ICD-10-CM

## 2022-01-14 DIAGNOSIS — C50212 Malignant neoplasm of upper-inner quadrant of left female breast: Secondary | ICD-10-CM | POA: Diagnosis not present

## 2022-01-14 DIAGNOSIS — R5383 Other fatigue: Secondary | ICD-10-CM | POA: Insufficient documentation

## 2022-01-14 DIAGNOSIS — Z79811 Long term (current) use of aromatase inhibitors: Secondary | ICD-10-CM | POA: Insufficient documentation

## 2022-01-14 DIAGNOSIS — Z79899 Other long term (current) drug therapy: Secondary | ICD-10-CM | POA: Insufficient documentation

## 2022-01-14 DIAGNOSIS — M810 Age-related osteoporosis without current pathological fracture: Secondary | ICD-10-CM

## 2022-01-14 DIAGNOSIS — Z923 Personal history of irradiation: Secondary | ICD-10-CM | POA: Insufficient documentation

## 2022-01-14 DIAGNOSIS — C50911 Malignant neoplasm of unspecified site of right female breast: Secondary | ICD-10-CM | POA: Insufficient documentation

## 2022-01-14 LAB — CMP (CANCER CENTER ONLY)
ALT: 15 U/L (ref 0–44)
AST: 21 U/L (ref 15–41)
Albumin: 4.4 g/dL (ref 3.5–5.0)
Alkaline Phosphatase: 101 U/L (ref 38–126)
Anion gap: 6 (ref 5–15)
BUN: 19 mg/dL (ref 8–23)
CO2: 30 mmol/L (ref 22–32)
Calcium: 9.6 mg/dL (ref 8.9–10.3)
Chloride: 100 mmol/L (ref 98–111)
Creatinine: 0.61 mg/dL (ref 0.44–1.00)
GFR, Estimated: >60 mL/min
Glucose, Bld: 95 mg/dL (ref 70–99)
Potassium: 3.7 mmol/L (ref 3.5–5.1)
Sodium: 136 mmol/L (ref 135–145)
Total Bilirubin: 0.5 mg/dL (ref 0.3–1.2)
Total Protein: 7.7 g/dL (ref 6.5–8.1)

## 2022-01-14 LAB — CBC WITH DIFFERENTIAL (CANCER CENTER ONLY)
Abs Immature Granulocytes: 0.01 10*3/uL (ref 0.00–0.07)
Basophils Absolute: 0 10*3/uL (ref 0.0–0.1)
Basophils Relative: 1 %
Eosinophils Absolute: 0.1 10*3/uL (ref 0.0–0.5)
Eosinophils Relative: 2 %
HCT: 41.7 % (ref 36.0–46.0)
Hemoglobin: 14.1 g/dL (ref 12.0–15.0)
Immature Granulocytes: 0 %
Lymphocytes Relative: 31 %
Lymphs Abs: 1.8 10*3/uL (ref 0.7–4.0)
MCH: 30.9 pg (ref 26.0–34.0)
MCHC: 33.8 g/dL (ref 30.0–36.0)
MCV: 91.4 fL (ref 80.0–100.0)
Monocytes Absolute: 0.5 10*3/uL (ref 0.1–1.0)
Monocytes Relative: 8 %
Neutro Abs: 3.5 10*3/uL (ref 1.7–7.7)
Neutrophils Relative %: 58 %
Platelet Count: 225 10*3/uL (ref 150–400)
RBC: 4.56 MIL/uL (ref 3.87–5.11)
RDW: 12 % (ref 11.5–15.5)
WBC Count: 5.9 10*3/uL (ref 4.0–10.5)
nRBC: 0 % (ref 0.0–0.2)

## 2022-01-19 ENCOUNTER — Encounter: Payer: Self-pay | Admitting: Hematology and Oncology

## 2022-01-21 ENCOUNTER — Ambulatory Visit
Admission: RE | Admit: 2022-01-21 | Discharge: 2022-01-21 | Disposition: A | Discharge status: Home / Self Care | Payer: Medicare PPO | Source: Ambulatory Visit | Attending: Oncology | Admitting: Oncology

## 2022-01-21 DIAGNOSIS — M810 Age-related osteoporosis without current pathological fracture: Secondary | ICD-10-CM

## 2022-01-21 DIAGNOSIS — C50411 Malignant neoplasm of upper-outer quadrant of right female breast: Secondary | ICD-10-CM

## 2022-06-01 ENCOUNTER — Ambulatory Visit (HOSPITAL_COMMUNITY)
Admission: RE | Admit: 2022-06-01 | Discharge: 2022-06-01 | Disposition: A | Discharge status: Home / Self Care | Payer: Medicare PPO | Source: Ambulatory Visit | Attending: Hematology and Oncology | Admitting: Hematology and Oncology

## 2022-06-01 DIAGNOSIS — C50212 Malignant neoplasm of upper-inner quadrant of left female breast: Secondary | ICD-10-CM | POA: Insufficient documentation

## 2022-06-01 DIAGNOSIS — Z170 Estrogen receptor positive status [ER+]: Secondary | ICD-10-CM | POA: Insufficient documentation

## 2022-06-01 DIAGNOSIS — M810 Age-related osteoporosis without current pathological fracture: Secondary | ICD-10-CM | POA: Diagnosis present

## 2022-06-01 MED ORDER — GADOBUTROL 1 MMOL/ML IV SOLN
7.0000 mL | Freq: Once | INTRAVENOUS | Status: AC | PRN
  Administered 2022-06-01: 7 mL via INTRAVENOUS

## 2022-07-23 ENCOUNTER — Other Ambulatory Visit: Payer: Self-pay

## 2022-07-23 ENCOUNTER — Telehealth: Payer: Self-pay

## 2022-07-23 MED ORDER — ANASTROZOLE 1 MG PO TABS: 1.0000 mg | ORAL_TABLET | Freq: Every day | ORAL | 3 refills | Status: AC

## 2022-07-23 NOTE — Telephone Encounter (Signed)
LVM to inform patient that updated Anastrozole prescription had been sent in to her preferred pharmacy. Left callback number should patient have any additional questions or concerns.

## 2022-10-06 ENCOUNTER — Telehealth: Payer: Self-pay | Admitting: *Deleted

## 2022-10-06 ENCOUNTER — Other Ambulatory Visit: Payer: Self-pay

## 2022-10-06 ENCOUNTER — Inpatient Hospital Stay: Payer: Medicare PPO | Attending: Physician Assistant | Admitting: Physician Assistant

## 2022-10-06 VITALS — BP 98/60 | HR 75 | Temp 97.7°F | Resp 16 | Wt 147.6 lb

## 2022-10-06 DIAGNOSIS — C50411 Malignant neoplasm of upper-outer quadrant of right female breast: Secondary | ICD-10-CM | POA: Diagnosis not present

## 2022-10-06 DIAGNOSIS — C50212 Malignant neoplasm of upper-inner quadrant of left female breast: Secondary | ICD-10-CM | POA: Diagnosis not present

## 2022-10-06 DIAGNOSIS — N644 Mastodynia: Secondary | ICD-10-CM

## 2022-10-06 DIAGNOSIS — Z170 Estrogen receptor positive status [ER+]: Secondary | ICD-10-CM

## 2022-10-06 NOTE — Telephone Encounter (Signed)
This RN spoke with pt per her VM stating onset of "severe tenderness on the right breast on the right side"  She states tenderness goes under arm " and kind of down the arm " and up to neck.  She denies any redness or swelling.  Has been present for a week.  Plan per above is for patient to come in today for [facility] evaluation due to possible cellulitis.  Pt is in agreement to above.  Appts being scheduled by nurse in [facility].

## 2022-10-06 NOTE — Progress Notes (Addendum)
Symptom Management Consult Note [place]    Patient Care Team: Reynold Bowen, MD as PCP - General (Endocrinology) Kyung Rudd, MD as Consulting Physician (Radiation Oncology) Rolm Bookbinder, MD as Consulting Physician (General Surgery) Molli Posey, MD as Consulting Physician (Obstetrics and Gynecology) Benay Pike, MD as Consulting Physician (Hematology and Oncology)    Name / MRN / DOB: Krista Butler  [id number]  November 07, 1950   Date of visit: 10/06/2022   Chief Complaint/Reason for visit: right breast tenderness   Current Therapy: Anastrazole     ASSESSMENT & PLAN: Patient is a 72 y.o. female  with oncologic history of new estrogen receptor positive right breast cancer; history of noninvasive left breast cancer.  followed by Dr. Chryl Heck.  I have viewed most recent oncology note and lab work.    #New estrogen receptor positive right breast cancer; history of noninvasive left breast cancer - Next appointment with oncologist is 01/17/23 - Patient stopped taking Anastrozole x 2 months ago as she was bothered by the side effects. I will inform oncologist of this.   #Right breast pain - Breast exam without palpable masses.  No overlying skin changes to suggest cellulitis. -Patient will need imaging to rule out recurrence vs abscess or seroma as cause of her symptoms. - Diagnostic right breast MRI and ultrasound have been ordered.  I will follow-up with patient 3 days after imaging to review results. - Discussed symptomatic care with Tylenol and heating pad . Patient agreeable with plan of care.   Strict ED precautions discussed should symptoms worsen.    Heme/Onc History: Oncology History  Malignant neoplasm of upper-inner quadrant of left breast in female, estrogen receptor positive ([place])  11/09/2012 Initial Diagnosis   Malignant neoplasm of upper-inner quadrant of left breast in female, estrogen receptor positive ([place])   12/03/2019 Genetic  Testing   Negative genetic testing:  No pathogenic variants detected on the [facility] Breast Cancer STAT + Multi-Cancer panels. The report date is 12/03/2019.  The Breast Cancer STAT Panel offered by [facility] includes sequencing and deletion/duplication analysis for the following 9 genes:  ATM, BRCA1, BRCA2, CDH1, CHEK2, PALB2, PTEN, STK11 and TP53. The Multi-Cancer Panel offered by [facility] includes sequencing and/or deletion duplication testing of the following 85 genes: AIP, ALK, APC, ATM, AXIN2,BAP1,  BARD1, BLM, BMPR1A, BRCA1, BRCA2, BRIP1, CASR, CDC73, CDH1, CDK4, CDKN1B, CDKN1C, CDKN2A (p14ARF), CDKN2A (p16INK4a), CEBPA, CHEK2, CTNNA1, DICER1, DIS3L2, EGFR (c.2369C>T, p.Thr790Met variant only), EPCAM (Deletion/duplication testing only), FH, FLCN, GATA2, GPC3, GREM1 (Promoter region deletion/duplication testing only), HOXB13 (c.251G>A, p.Gly84Glu), HRAS, KIT, MAX, MEN1, MET, MITF (c.952G>A, p.Glu318Lys variant only), MLH1, MSH2, MSH3, MSH6, MUTYH, NBN, NF1, NF2, NTHL1, PALB2, PDGFRA, PHOX2B, PMS2, POLD1, POLE, POT1, PRKAR1A, PTCH1, PTEN, RAD50, RAD51C, RAD51D, RB1, RECQL4, RET, RNF43, RUNX1, SDHAF2, SDHA (sequence changes only), SDHB, SDHC, SDHD, SMAD4, SMARCA4, SMARCB1, SMARCE1, STK11, SUFU, TERC, TERT, TMEM127, TP53, TSC1, TSC2, VHL, WRN and WT1.   Malignant neoplasm of upper-outer quadrant of right breast in female, estrogen receptor positive ([place])  11/13/2019 Cancer Staging   Staging form: Breast, AJCC 8th Edition - Clinical stage from 11/13/2019: Stage IB (cT2, cN0, cM0, G2, ER+, PR+, HER2-) - Signed by Gardenia Phlegm, NP on 01/09/2020   11/21/2019 Initial Diagnosis   Malignant neoplasm of upper-outer quadrant of right breast in female, estrogen receptor positive ([place])   12/26/2019 Cancer Staging   Staging form: Breast, AJCC 8th Edition - Pathologic stage from 12/26/2019: Stage IA (pT2, pN0, cM0, G2, ER+, PR+, HER2-) - Signed  by Gardenia Phlegm, NP on 01/09/2020       Interval  history-: LUCIANA ILLES is a 72 y.o. female with oncologic history as above presenting to [facility] today with chief complaint of right breast pain x 1 month.  Patient presents unaccompanied to clinic today.  Patient states the pain started on the left side of her right breast x 1 month ago.  At that time it was a mild tenderness and she did not think much of it.  The pain then started to spread to her right armpit.  She reports the pain is present with palpation, she does not appreciate it at rest.  The pain has progressively worsened. Yesterday pain was 8/10 in severity. Today it is mild 3/10.  She has not noticed any changes to her skin, swelling or nipple discharge.  She denies any injury or trauma that could have caused the pain.  He does workout a couple of times per week with strength training.  She lifts 4 pounds dumbbells which she has been doing for a while now. She denies any fever or chills.   Patient adds that she stopped taking her anastrozole x 2 months ago.  She had previously been on it for x 2 years.  She had been experiencing fatigue, brain fog and joint pain.  Since stopping the medication she feels back to her usual self.  She has not yet informed oncologist that she discontinue the medication.   ROS  All other systems are reviewed and are negative for acute change except as noted in the HPI.    Allergies  Allergen Reactions   Sulfa Antibiotics Cough   Percocet [Oxycodone-Acetaminophen]     Itching and flushed face     Past Medical History:  Diagnosis Date   Allergy    medicated for this   Arm fracture    LEFT arm; as a child   Breast cancer ([place])    left   Diverticulosis    Family history of breast cancer    Family history of kidney cancer    GERD (gastroesophageal reflux disease)    History of radiation therapy 12/25/12-02/08/13   64.4 Gy to left breast   Osteoporosis    Personal history of radiation therapy    Thyroid disease    Wears glasses      Past  Surgical History:  Procedure Laterality Date   BREAST BIOPSY  1997   lt breast cyst   BREAST LUMPECTOMY Left 2014   BREAST LUMPECTOMY WITH NEEDLE LOCALIZATION Left 11/23/2012   Procedure: BREAST LUMPECTOMY WITH NEEDLE LOCALIZATION;  Surgeon: Rolm Bookbinder, MD;  Location: [place];  Service: General;  Laterality: Left;   BREAST LUMPECTOMY WITH RADIOACTIVE SEED AND SENTINEL LYMPH NODE BIOPSY Right 12/26/2019   Procedure: RIGHT BREAST LUMPECTOMY WITH RADIOACTIVE SEED AND RIGHT AXILLARY SENTINEL LYMPH NODE BIOPSY;  Surgeon: Rolm Bookbinder, MD;  Location: [place];  Service: General;  Laterality: Right;   COLONOSCOPY     TONSILLECTOMY     UPPER GI ENDOSCOPY  2012    Social History   Socioeconomic History   Marital status: Married    Spouse name: Not on file   Number of children: 2   Years of education: Not on file   Highest education level: Not on file  Occupational History   Occupation: Homemaker  Tobacco Use   Smoking status: Never   Smokeless tobacco: Never  Vaping Use   Vaping Use: Never used  Substance  and Sexual Activity   Alcohol use: Yes    Alcohol/week: 2.0 standard drinks of alcohol    Types: 2 Glasses of wine per week    Comment: occ   Drug use: No   Sexual activity: Not on file  Other Topics Concern   Not on file  Social History Narrative   Not on file   Social Determinants of Health   Financial Resource Strain: Not on file  Food Insecurity: Not on file  Transportation Needs: Not on file  Physical Activity: Not on file  Stress: Not on file  Social Connections: Not on file  Intimate Partner Violence: Not on file    Family History  Problem Relation Age of Onset   Heart disease Mother    Hypertension Mother    Stroke Father    Heart disease Father    Diabetes Father    Depression Sister    Hyperlipidemia Sister    Stroke Maternal Grandmother    Stroke Maternal Grandfather    Heart Problems Paternal Grandfather     Breast cancer Cousin        dx. younger than 84 (paternal cousin)   Cancer Cousin        dx. in her 20s, unknown type (paternal cousin)   Kidney cancer Cousin 50       (maternal cousin)   Other Cousin        genetic testing - unknown results (maternal cousin)   Colon cancer Neg Hx    Esophageal cancer Neg Hx    Pancreatic cancer Neg Hx    Stomach cancer Neg Hx    Liver disease Neg Hx      Current Outpatient Medications:    AMBULATORY NON FORMULARY MEDICATION, CT-Mineral tablets 20 drops by mouth twice a day, Disp: , Rfl:    AMBULATORY NON FORMULARY MEDICATION, FungDX 1 capsul by mouth twice a day, Disp: , Rfl:    AMBULATORY NON FORMULARY MEDICATION, Bactrex 1 capsule by mouth twice a day, Disp: , Rfl:    anastrozole (ARIMIDEX) 1 MG tablet, Take 1 tablet (1 mg total) by mouth daily., Disp: 90 tablet, Rfl: 3   Ascorbic Acid (VITAMIN C PO), Take 1 tablet by mouth daily., Disp: , Rfl:    Biotin 1000 MCG tablet, Take 1,000 mcg by mouth daily., Disp: , Rfl:    Cholecalciferol (VITAMIN D3 PO), Take 2 tablets by mouth daily., Disp: , Rfl:    Cyanocobalamin (B-12) 1000 MCG CAPS, Take 1 tablet by mouth daily., Disp: , Rfl:    L-glutamine (ENDARI) 5 g PACK Powder Packet, Take by mouth daily., Disp: , Rfl:    levothyroxine (SYNTHROID, LEVOTHROID) 175 MCG tablet, Take 175 mcg by mouth daily before breakfast., Disp: , Rfl:    loratadine (CLARITIN) 10 MG tablet, Take 10 mg by mouth daily., Disp: , Rfl:    MAGNESIUM PO, Take 1 Dose by mouth at bedtime., Disp: , Rfl:    Multiple Minerals-Vitamins (BONE ESSENTIALS PO), Take 1 tablet by mouth daily., Disp: , Rfl:    Multiple Vitamins-Minerals (PHYTOMULTI PO), Take 3 tablets by mouth daily., Disp: , Rfl:    pantoprazole (PROTONIX) 40 MG tablet, Take 1 tablet (40 mg total) by mouth 2 (two) times daily., Disp: 180 tablet, Rfl: 3   Probiotic Product (PROBIOTIC DAILY) CAPS, Take 1 capsule by mouth daily., Disp: , Rfl:    Pseudoephedrine-guaiFENesin  (MUCINEX D PO), Take 1 capsule by mouth daily., Disp: , Rfl:    SALINE NA, Place into  the nose as needed., Disp: , Rfl:    zinc gluconate 50 MG tablet, Take 50 mg by mouth daily. 30 mg, Disp: , Rfl:   PHYSICAL EXAM: ECOG FS:1 - Symptomatic but completely ambulatory    Vitals:   10/06/22 1358  BP: 98/60  Pulse: 75  Resp: 16  Temp: 97.7 F (36.5 C)  TempSrc: Oral  SpO2: 98%  Weight: 147 lb 9.6 oz (67 kg)   Physical Exam Vitals and nursing note reviewed.  Constitutional:      Appearance: She is well-developed. She is not ill-appearing or toxic-appearing.  HENT:     Head: Normocephalic.  Eyes:     Conjunctiva/sclera: Conjunctivae normal.  Neck:     Vascular: No JVD.  Cardiovascular:     Rate and Rhythm: Normal rate.  Pulmonary:     Effort: Pulmonary effort is normal.  Chest:       Comments: Patient is status post bilateral lumpectomy with a well-healed surgical scars. No evidence of any palpable masses on bilateral breasts. Does have dense breast tissue. No evidence of axillary adenopathy. No evidence of any palpable masses or lumps in the left breast. No evidence of left axillary adenopathy  Abdominal:     General: There is no distension.  Musculoskeletal:        General: Normal range of motion.     Cervical back: Normal range of motion.     Comments: Compartments soft in right upper extremity.  Skin:    General: Skin is warm and dry.  Neurological:     Mental Status: She is oriented to person, place, and time.        LABORATORY DATA: I have reviewed the data as listed    Latest Ref Rng & Units 01/14/2022   11:19 AM 04/24/2021    2:00 PM 12/15/2020    2:49 PM  CBC  WBC 4.0 - 10.5 K/uL 5.9  5.0  4.5   Hemoglobin 12.0 - 15.0 g/dL 14.1  14.2  12.7   Hematocrit 36.0 - 46.0 % 41.7  42.0  37.4   Platelets 150 - 400 K/uL 225  237  231         Latest Ref Rng & Units 01/14/2022   11:19 AM 04/24/2021    2:00 PM 12/15/2020    2:49 PM  CMP  Glucose 70 - 99 mg/dL  95  90  98   BUN 8 - 23 mg/dL '19  13  15   '$ Creatinine 0.44 - 1.00 mg/dL 0.61  0.65  0.91   Sodium 135 - 145 mmol/L 136  136  139   Potassium 3.5 - 5.1 mmol/L 3.7  4.2  4.2   Chloride 98 - 111 mmol/L 100  103  103   CO2 22 - 32 mmol/L '30  27  28   '$ Calcium 8.9 - 10.3 mg/dL 9.6  9.3  9.4   Total Protein 6.5 - 8.1 g/dL 7.7  7.1  7.3   Total Bilirubin 0.3 - 1.2 mg/dL 0.5  0.7  0.6   Alkaline Phos 38 - 126 U/L 101  92  108   AST 15 - 41 U/L '21  25  24   '$ ALT 0 - 44 U/L '15  20  14        '$ RADIOGRAPHIC STUDIES (from last 24 hours if applicable) I have personally reviewed the radiological images as listed and agreed with the findings in the report. No results found.      Visit  Diagnosis: 1. Breast pain   2. Malignant neoplasm of upper-inner quadrant of left breast in female, estrogen receptor positive ([place])   3. Malignant neoplasm of upper-outer quadrant of right breast in female, estrogen receptor positive ([place])      Orders Placed This Encounter  Procedures   MM DIAG BREAST TOMO UNI RIGHT    Standing Status:   Future    Standing Expiration Date:   10/06/2023    Order Specific Question:   Reason for Exam (SYMPTOM  OR DIAGNOSIS REQUIRED)    Answer:   right breast pain, history of bilateral breast cancer    Order Specific Question:   Preferred imaging location?    Answer[facility]   [facility]   US BREAST COMPLETE UNI RIGHT INC AXILLA    Standing Status:   Future    Standing Expiration Date:   10/06/2023    Order Specific Question:   Reason for Exam (SYMPTOM  OR DIAGNOSIS REQUIRED)    Answer:   right breast pain, history bilateral breast cancer s/p lumpectomies    Order Specific Question:   Preferred imaging location?    Answer:   [facility]    All questions were answered. The patient knows to call the clinic with any problems, questions or concerns. No barriers to learning was detected.  I have spent a total of 20 minutes minutes of face-to-face and non-face-to-face time,  preparing to see the patient, obtaining and/or reviewing separately obtained history, performing a medically appropriate examination, counseling and educating the patient, ordering tests, documenting clinical information in the electronic health record, and care coordination (communications with other health care professionals or caregivers).    Thank you for allowing me to participate in the care of this patient.    Barrie Folk, PA-C Department of Hematology/Oncology [facility] Phone: [phone]  [phone]    10/06/2022 3:47 PM

## 2022-10-08 ENCOUNTER — Telehealth: Payer: Self-pay | Admitting: Hematology and Oncology

## 2022-10-08 NOTE — Telephone Encounter (Signed)
Spoke with patient husband confirming upcoming appointment  

## 2022-10-21 ENCOUNTER — Ambulatory Visit
Admission: RE | Admit: 2022-10-21 | Discharge: 2022-10-21 | Disposition: A | Discharge status: Home / Self Care | Payer: Medicare PPO | Source: Ambulatory Visit | Attending: Physician Assistant | Admitting: Physician Assistant

## 2022-10-21 DIAGNOSIS — C50411 Malignant neoplasm of upper-outer quadrant of right female breast: Secondary | ICD-10-CM

## 2022-10-21 DIAGNOSIS — N644 Mastodynia: Secondary | ICD-10-CM

## 2022-10-26 ENCOUNTER — Inpatient Hospital Stay: Payer: Medicare PPO | Attending: Physician Assistant | Admitting: Physician Assistant

## 2022-10-26 DIAGNOSIS — N644 Mastodynia: Secondary | ICD-10-CM

## 2022-10-26 NOTE — Progress Notes (Signed)
I connected with NERY MUZNY on 10/26/22 at 12:00 PM EDT by telephone and verified that I am speaking with the correct person using two identifiers.   I discussed the limitations, risks, security and privacy concerns of performing an evaluation and management service by telemedicine and the availability of in-person appointments. I also discussed with the patient that there may be a patient responsible charge related to this service. The patient expressed understanding and agreed to proceed.   Other persons participating in the visit and their role in the encounter: none   Patient's location: home   Provider's location: [facility]       Symptom Management Consult note [place]    Patient Care Team: Reynold Bowen, MD as PCP - General (Endocrinology) Kyung Rudd, MD as Consulting Physician (Radiation Oncology) Rolm Bookbinder, MD as Consulting Physician (General Surgery) Molli Posey, MD as Consulting Physician (Obstetrics and Gynecology) Benay Pike, MD as Consulting Physician (Hematology and Oncology)    Name of the patient: Krista Butler  [id number]  October 18, 1950   Date of visit: 10/26/2022    Chief complaint/ Reason for visit- f/u imaging results  Oncology History  Malignant neoplasm of upper-inner quadrant of left breast in female, estrogen receptor positive  11/09/2012 Initial Diagnosis   Malignant neoplasm of upper-inner quadrant of left breast in female, estrogen receptor positive ([place])   12/03/2019 Genetic Testing   Negative genetic testing:  No pathogenic variants detected on the [facility] Breast Cancer STAT + Multi-Cancer panels. The report date is 12/03/2019.  The Breast Cancer STAT Panel offered by [facility] includes sequencing and deletion/duplication analysis for the following 9 genes:  ATM, BRCA1, BRCA2, CDH1, CHEK2, PALB2, PTEN, STK11 and TP53. The Multi-Cancer Panel offered by [facility] includes sequencing and/or deletion duplication testing of the  following 85 genes: AIP, ALK, APC, ATM, AXIN2,BAP1,  BARD1, BLM, BMPR1A, BRCA1, BRCA2, BRIP1, CASR, CDC73, CDH1, CDK4, CDKN1B, CDKN1C, CDKN2A (p14ARF), CDKN2A (p16INK4a), CEBPA, CHEK2, CTNNA1, DICER1, DIS3L2, EGFR (c.2369C>T, p.Thr790Met variant only), EPCAM (Deletion/duplication testing only), FH, FLCN, GATA2, GPC3, GREM1 (Promoter region deletion/duplication testing only), HOXB13 (c.251G>A, p.Gly84Glu), HRAS, KIT, MAX, MEN1, MET, MITF (c.952G>A, p.Glu318Lys variant only), MLH1, MSH2, MSH3, MSH6, MUTYH, NBN, NF1, NF2, NTHL1, PALB2, PDGFRA, PHOX2B, PMS2, POLD1, POLE, POT1, PRKAR1A, PTCH1, PTEN, RAD50, RAD51C, RAD51D, RB1, RECQL4, RET, RNF43, RUNX1, SDHAF2, SDHA (sequence changes only), SDHB, SDHC, SDHD, SMAD4, SMARCA4, SMARCB1, SMARCE1, STK11, SUFU, TERC, TERT, TMEM127, TP53, TSC1, TSC2, VHL, WRN and WT1.   Malignant neoplasm of upper-outer quadrant of right breast in female, estrogen receptor positive  11/13/2019 Cancer Staging   Staging form: Breast, AJCC 8th Edition - Clinical stage from 11/13/2019: Stage IB (cT2, cN0, cM0, G2, ER+, PR+, HER2-) - Signed by Gardenia Phlegm, NP on 01/09/2020   11/21/2019 Initial Diagnosis   Malignant neoplasm of upper-outer quadrant of right breast in female, estrogen receptor positive ([place])   12/26/2019 Cancer Staging   Staging form: Breast, AJCC 8th Edition - Pathologic stage from 12/26/2019: Stage IA (pT2, pN0, cM0, G2, ER+, PR+, HER2-) - Signed by Gardenia Phlegm, NP on 01/09/2020     Current Therapy: none  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 female with oncologic history as above contacted today via telephone to discuss right breast imaging results from 10/21/22. Patient reports her right breast pain has nearly resolved. She is still not taking the Anastrozole as she feels more like her usual self without it. She denies any additional complaints.   ROS  All other systems  are reviewed and are negative for acute change except as noted in  the HPI.    Allergies  Allergen Reactions   Sulfa Antibiotics Cough   Percocet [Oxycodone-Acetaminophen]     Itching and flushed face     Past Medical History:  Diagnosis Date   Allergy    medicated for this   Arm fracture    LEFT arm; as a child   Breast cancer ([place])    left   Diverticulosis    Family history of breast cancer    Family history of kidney cancer    GERD (gastroesophageal reflux disease)    History of radiation therapy 12/25/12-02/08/13   64.4 Gy to left breast   Osteoporosis    Personal history of radiation therapy    Thyroid disease    Wears glasses      Past Surgical History:  Procedure Laterality Date   BREAST BIOPSY  1997   lt breast cyst   BREAST LUMPECTOMY Left 2014   BREAST LUMPECTOMY Right 2021   BREAST LUMPECTOMY WITH NEEDLE LOCALIZATION Left 11/23/2012   Procedure: BREAST LUMPECTOMY WITH NEEDLE LOCALIZATION;  Surgeon: Rolm Bookbinder, MD;  Location: [place];  Service: General;  Laterality: Left;   BREAST LUMPECTOMY WITH RADIOACTIVE SEED AND SENTINEL LYMPH NODE BIOPSY Right 12/26/2019   Procedure: RIGHT BREAST LUMPECTOMY WITH RADIOACTIVE SEED AND RIGHT AXILLARY SENTINEL LYMPH NODE BIOPSY;  Surgeon: Rolm Bookbinder, MD;  Location: [place];  Service: General;  Laterality: Right;   COLONOSCOPY     TONSILLECTOMY     UPPER GI ENDOSCOPY  2012    Social History   Socioeconomic History   Marital status: Married    Spouse name: Not on file   Number of children: 2   Years of education: Not on file   Highest education level: Not on file  Occupational History   Occupation: Homemaker  Tobacco Use   Smoking status: Never   Smokeless tobacco: Never  Vaping Use   Vaping Use: Never used  Substance and Sexual Activity   Alcohol use: Yes    Alcohol/week: 2.0 standard drinks of alcohol    Types: 2 Glasses of wine per week    Comment: occ   Drug use: No   Sexual activity: Not on file  Other Topics  Concern   Not on file  Social History Narrative   Not on file   Social Determinants of Health   Financial Resource Strain: Not on file  Food Insecurity: Not on file  Transportation Needs: Not on file  Physical Activity: Not on file  Stress: Not on file  Social Connections: Not on file  Intimate Partner Violence: Not on file    Family History  Problem Relation Age of Onset   Heart disease Mother    Hypertension Mother    Stroke Father    Heart disease Father    Diabetes Father    Depression Sister    Hyperlipidemia Sister    Stroke Maternal Grandmother    Stroke Maternal Grandfather    Heart Problems Paternal Grandfather    Breast cancer Cousin        dx. younger than 31 (paternal cousin)   Cancer Cousin        dx. in her 13s, unknown type (paternal cousin)   Kidney cancer Cousin 72       (maternal cousin)   Other Cousin        genetic testing - unknown results (maternal cousin)  Colon cancer Neg Hx    Esophageal cancer Neg Hx    Pancreatic cancer Neg Hx    Stomach cancer Neg Hx    Liver disease Neg Hx      Current Outpatient Medications:    AMBULATORY NON FORMULARY MEDICATION, CT-Mineral tablets 20 drops by mouth twice a day, Disp: , Rfl:    AMBULATORY NON FORMULARY MEDICATION, FungDX 1 capsul by mouth twice a day, Disp: , Rfl:    AMBULATORY NON FORMULARY MEDICATION, Bactrex 1 capsule by mouth twice a day, Disp: , Rfl:    anastrozole (ARIMIDEX) 1 MG tablet, Take 1 tablet (1 mg total) by mouth daily., Disp: 90 tablet, Rfl: 3   Ascorbic Acid (VITAMIN C PO), Take 1 tablet by mouth daily., Disp: , Rfl:    Biotin 1000 MCG tablet, Take 1,000 mcg by mouth daily., Disp: , Rfl:    Cholecalciferol (VITAMIN D3 PO), Take 2 tablets by mouth daily., Disp: , Rfl:    Cyanocobalamin (B-12) 1000 MCG CAPS, Take 1 tablet by mouth daily., Disp: , Rfl:    L-glutamine (ENDARI) 5 g PACK Powder Packet, Take by mouth daily., Disp: , Rfl:    levothyroxine (SYNTHROID, LEVOTHROID) 175 MCG  tablet, Take 175 mcg by mouth daily before breakfast., Disp: , Rfl:    loratadine (CLARITIN) 10 MG tablet, Take 10 mg by mouth daily., Disp: , Rfl:    MAGNESIUM PO, Take 1 Dose by mouth at bedtime., Disp: , Rfl:    Multiple Minerals-Vitamins (BONE ESSENTIALS PO), Take 1 tablet by mouth daily., Disp: , Rfl:    Multiple Vitamins-Minerals (PHYTOMULTI PO), Take 3 tablets by mouth daily., Disp: , Rfl:    pantoprazole (PROTONIX) 40 MG tablet, Take 1 tablet (40 mg total) by mouth 2 (two) times daily., Disp: 180 tablet, Rfl: 3   Probiotic Product (PROBIOTIC DAILY) CAPS, Take 1 capsule by mouth daily., Disp: , Rfl:    Pseudoephedrine-guaiFENesin (MUCINEX D PO), Take 1 capsule by mouth daily., Disp: , Rfl:    SALINE NA, Place into the nose as needed., Disp: , Rfl:    zinc gluconate 50 MG tablet, Take 50 mg by mouth daily. 30 mg, Disp: , Rfl:   PHYSICAL EXAM: ECOG FS:0 - Asymptomatic   There were no vitals filed for this visit.  Patient speaking in clear sentences, no respiratory distress    LABORATORY DATA: I have reviewed the data as listed    Latest Ref Rng & Units 01/14/2022   11:19 AM 04/24/2021    2:00 PM 12/15/2020    2:49 PM  CBC  WBC 4.0 - 10.5 K/uL 5.9  5.0  4.5   Hemoglobin 12.0 - 15.0 g/dL 14.1  14.2  12.7   Hematocrit 36.0 - 46.0 % 41.7  42.0  37.4   Platelets 150 - 400 K/uL 225  237  231         Latest Ref Rng & Units 01/14/2022   11:19 AM 04/24/2021    2:00 PM 12/15/2020    2:49 PM  CMP  Glucose 70 - 99 mg/dL 95  90  98   BUN 8 - 23 mg/dL 19  13  15    Creatinine 0.44 - 1.00 mg/dL 0.61  0.65  0.91   Sodium 135 - 145 mmol/L 136  136  139   Potassium 3.5 - 5.1 mmol/L 3.7  4.2  4.2   Chloride 98 - 111 mmol/L 100  103  103   CO2 22 - 32 mmol/L 30  27  28   Calcium 8.9 - 10.3 mg/dL 9.6  9.3  9.4   Total Protein 6.5 - 8.1 g/dL 7.7  7.1  7.3   Total Bilirubin 0.3 - 1.2 mg/dL 0.5  0.7  0.6   Alkaline Phos 38 - 126 U/L 101  92  108   AST 15 - 41 U/L 21  25  24    ALT 0 - 44  U/L 15  20  14         RADIOGRAPHIC STUDIES: I have personally reviewed the radiological images as listed and agreed with the findings in the report. No images are attached to the encounter. MM DIAG BREAST TOMO UNI RIGHT  Result Date: 10/21/2022 CLINICAL DATA:  72 year old female presenting for evaluation of nonfocal pain near the patient's lumpectomy site in the right breast. She is also experiencing pain in her right neck and shoulder. She has history of bilateral malignancies. She had left breast cancer in 2014 and right breast cancer in 2021. EXAM: DIGITAL DIAGNOSTIC UNILATERAL RIGHT MAMMOGRAM WITH TOMOSYNTHESIS; ULTRASOUND RIGHT BREAST LIMITED TECHNIQUE: Right digital diagnostic mammography and breast tomosynthesis was performed.; Targeted ultrasound examination of the right breast was performed COMPARISON:  Previous exam(s). ACR Breast Density Category c: The breasts are heterogeneously dense, which may obscure small masses. FINDINGS: No suspicious calcifications, masses or areas of distortion are seen in the right breast. Ultrasound targeted to the upper-outer right breast in the region of her lumpectomy site demonstrates normal fibroglandular tissue. Ultrasound of the right axilla demonstrates normal subcutaneous tissue. No abnormal lymph nodes or masses are identified. IMPRESSION: 1. There are no suspicious mammographic or targeted sonographic abnormalities in the region of pain in the right breast. 2.  No evidence of right breast malignancy. RECOMMENDATION: 1.  Routine annual mammography is due in May of 2024. 2. Clinical follow-up recommended for the tender area of concern in the right breast and axilla. Any further workup should be based on clinical grounds. I have discussed the findings and recommendations with the patient. If applicable, a reminder letter will be sent to the patient regarding the next appointment. BI-RADS CATEGORY  2: Benign. Electronically Signed   By: Ammie Ferrier M.D.    On: 10/21/2022 09:46  [place] LIMITED ULTRASOUND INCLUDING AXILLA RIGHT BREAST  Result Date: 10/21/2022 CLINICAL DATA:  72 year old female presenting for evaluation of nonfocal pain near the patient's lumpectomy site in the right breast. She is also experiencing pain in her right neck and shoulder. She has history of bilateral malignancies. She had left breast cancer in 2014 and right breast cancer in 2021. EXAM: DIGITAL DIAGNOSTIC UNILATERAL RIGHT MAMMOGRAM WITH TOMOSYNTHESIS; ULTRASOUND RIGHT BREAST LIMITED TECHNIQUE: Right digital diagnostic mammography and breast tomosynthesis was performed.; Targeted ultrasound examination of the right breast was performed COMPARISON:  Previous exam(s). ACR Breast Density Category c: The breasts are heterogeneously dense, which may obscure small masses. FINDINGS: No suspicious calcifications, masses or areas of distortion are seen in the right breast. Ultrasound targeted to the upper-outer right breast in the region of her lumpectomy site demonstrates normal fibroglandular tissue. Ultrasound of the right axilla demonstrates normal subcutaneous tissue. No abnormal lymph nodes or masses are identified. IMPRESSION: 1. There are no suspicious mammographic or targeted sonographic abnormalities in the region of pain in the right breast. 2.  No evidence of right breast malignancy. RECOMMENDATION: 1.  Routine annual mammography is due in May of 2024. 2. Clinical follow-up recommended for the tender area of concern in the right breast and axilla.  Any further workup should be based on clinical grounds. I have discussed the findings and recommendations with the patient. If applicable, a reminder letter will be sent to the patient regarding the next appointment. BI-RADS CATEGORY  2: Benign. Electronically Signed   By: Ammie Ferrier M.D.   On: 10/21/2022 09:46    ASSESSMENT & PLAN: Patient is a 72 y.o. female with oncologic history of new estrogen receptor positive right breast  cancer; history of noninvasive left breast cancer followed by Dr. Chryl Heck    #)Right breast pain -Patient seen on 10/06/22 by me for right breast tenderness. Diagnostic mammogram and [place] to include axilla [facility]. I viewed image results which do not have evidence of right breast malignancy. Agree with radiologist impression. -Pain has resolved. No further interventions needed at this time. -Patient scheduled for phone vist with Dr. Chryl Heck on 11/03/22 to discuss hormone therapy.  Visit Diagnosis: 1. Breast pain      No orders of the defined types were placed in this encounter.   All questions were answered. The patient knows to call the clinic with any problems, questions or concerns. No barriers to learning was detected.  Time spent with patient on telephone encounter: 10 minutes   Thank you for allowing me to participate in the care of this patient.    Barrie Folk, PA-C Department of Hematology/Oncology [facility] Phone: [phone]  [phone]    10/26/2022 1:56 PM

## 2022-11-03 ENCOUNTER — Inpatient Hospital Stay (HOSPITAL_BASED_OUTPATIENT_CLINIC_OR_DEPARTMENT_OTHER): Payer: Medicare PPO | Admitting: Hematology and Oncology

## 2022-11-03 DIAGNOSIS — C50212 Malignant neoplasm of upper-inner quadrant of left female breast: Secondary | ICD-10-CM | POA: Diagnosis not present

## 2022-11-03 DIAGNOSIS — Z170 Estrogen receptor positive status [ER+]: Secondary | ICD-10-CM

## 2022-11-03 NOTE — Progress Notes (Signed)
[facility]  Telephone:(336) [phone] Fax:(336) [phone]    D: Krista Butler  DOB: 12-May-1951  MR#: [id number]  JYN#:[id number]#:[id number]   Patient Care Team: Adrian PrinceSouth, Stephen, MD as PCP - General (Endocrinology) Dorothy PufferMoody, John, MD as Consulting Physician (Radiation Oncology) Emelia LoronWakefield, Matthew, MD as Consulting Physician (General Surgery) Richarda OverlieHolland, Richard, MD as Consulting Physician (Obstetrics and Gynecology) Rachel MouldsIruku, Taci Sterling, MD as Consulting Physician (Hematology and Oncology) OTHER MD:   CHIEF COMPLAINT: new estrogen receptor positive right breast cancer; history of noninvasive left breast cancer.  CURRENT THERAPY: anastrozole   INTERVAL HISTORY:  Krista Butler returns today for follow up of her right breast cancer.  Since her last visit here, she apparently stopped anastrozole and has scheduled this telephone visit to review recommendations once again.  She tells me that she had fatigue, mental fog, some arthralgias and did not feel well on anastrozole.  She has done some research and wondered about benefits versus risks with anastrozole wanted to discuss about this.   Rest of the pertinent review of systems reviewed and negative   COVID 19 VACCINATION STATUS: infection 02/2020 and May 2022, no vaccinations   RIGHT BREAST CANCER HISTORY: From the original intake note:   She underwent bilateral diagnostic mammography with tomography and right breast ultrasonography at [facility] on 11/08/2019 showing: breast density category D; suspicious 2.5 cm right breast mass at 10 o'clock, muscle involvement cannot be excluded; no evidence of left breast malignancy or lymphadenopathy.  She proceeded to biopsy on 11/13/2019 of the right breast area in question. Pathology from the procedure ([id number]) revealed: invasive mammary carcinoma, grade 2, e-cadherin negative. Prognostic indicators significant for: estrogen receptor 95% positive, progesterone receptor 95% positive, both with a strong  staining intensity. Proliferation marker Ki67 of 20%. Her2 negative by immunohistochemistry (1+).   History of left breast cancer: From Dr Feliz BeamKalsoom Butler's earlier note:  #1Patient noted some thickening of the right breast. She did not have any symptoms in the left breast. The thickening did not show any discrete mass or distortion on the right. However the patient was found to have numerous bilateral calcifications. Within the left breast there was a new linear/branching calcifications measuring 12 mm. This was suspicious for malignancy. There are also 1.1 cm suspicious calcifications in a heterogeneous group within the upper inner quadrant of the right breast. She underwent stereotactic biopsy of both of these areas  #2The biopsy on the right was negative and only noted to be a fibroadenoma with associated coarse calcifications. 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  #3 patient had a MRI performed that showed a clot area of enhancement in the retroareolar region of the left breast measuring 1 cm in maximum does mention corresponding to the known DCIS.  #4 patient is now status post left-sided lumpectomy on 11/23/2012. Her pathology revealed DCIS grade 3. Margins were negative with the closest margin being 1 mm from the inferior posterior margins. Tumor was ER ER positive PR positive.  #5 status post completion of radiation therapy to the left breast on 02/08/2013. #6 patient will proceed with after her radiation with chemoprevention with tamoxifen 20 mg daily. But this will begin after she completes radiation therapy.  #7 NSABP B. 43 clinical study enrollment. He did meet with the nurse and her tissue will be sent for HER-2 testing. HER-2 negative and therefore did not enroll on B. 43 trial  #8 patient began adjuvant curative intent tamoxifen  20 mg daily starting 02/26/2013. Total of 5 years of therapy is  planned.   MEDICAL HISTORY: Past Medical History:  Diagnosis Date   Allergy    medicated for this   Arm fracture    LEFT arm; as a child   Breast cancer (HCC)    left   Diverticulosis    Family history of breast cancer    Family history of kidney cancer    GERD (gastroesophageal reflux disease)    History of radiation therapy 12/25/12-02/08/13   64.4 Gy to left breast   Osteoporosis    Personal history of radiation therapy    Thyroid disease    Wears glasses     SURGICAL HISTORY:  Past Surgical History:  Procedure Laterality Date   BREAST BIOPSY  1997   lt breast cyst   BREAST LUMPECTOMY Left 2014   BREAST LUMPECTOMY Right 2021   BREAST LUMPECTOMY WITH NEEDLE LOCALIZATION Left 11/23/2012   Procedure: BREAST LUMPECTOMY WITH NEEDLE LOCALIZATION;  Surgeon: [name], MD;  Location: [facility];  Service: General;  Laterality: Left;   BREAST LUMPECTOMY WITH RADIOACTIVE SEED AND SENTINEL LYMPH NODE BIOPSY Right 12/26/2019   Procedure: RIGHT BREAST LUMPECTOMY WITH RADIOACTIVE SEED AND RIGHT AXILLARY SENTINEL LYMPH NODE BIOPSY;  Surgeon: [name], MD;  Location: [facility];  Service: General;  Laterality: Right;   COLONOSCOPY     TONSILLECTOMY     UPPER GI ENDOSCOPY  2012    FAMILY HISTORY: Family History  Problem Relation Age of Onset   Heart disease Mother    Hypertension Mother    Stroke Father    Heart disease Father    Diabetes Father    Depression Sister    Hyperlipidemia Sister    Stroke Maternal Grandmother    Stroke Maternal Grandfather    Heart Problems Paternal Grandfather    Breast cancer Cousin        dx. younger than 66 (paternal cousin)   Cancer Cousin        dx. in her 14s, unknown type (paternal cousin)   Kidney cancer Cousin 6       (maternal cousin)   Other Cousin        genetic testing - unknown results (maternal cousin)   Colon cancer Neg Hx    Esophageal cancer Neg Hx    Pancreatic cancer Neg  Hx    Stomach cancer Neg Hx    Liver disease Neg Hx   The patient's father died at the age of 7 following a stroke in the setting of severe heart disease. The patient's mother died also from heart disease at the age of 72. The patient had no brothers, one sister. There is no history of breast or ovarian cancer in the family.   GYN HISTORY: Menarche age 1, first live birth age 62. The patient is GX P2. She went through menopause in her early 35s. She took hormone replacement briefly (approximately 2 years).   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 Her husband of 40+ years, Freida Busman, used to work for [employer] but is now a Surveyor, minerals. Daughter Laurena Bering is a homemaker in [place]. Son Annia Belt is a tool and dye Designer, television/film set. The patient has 5 grandchildren. She has one great grandchild. She attends the home church in [place].   ADVANCED DIRECTIVES: In the absence of any documentation to the contrary, the patient's spouse is their HCPOA.    HEALTH MAINTENANCE:  Social History   Tobacco Use   Smoking status: Never   Smokeless tobacco: Never  Vaping Use   Vaping Use: Never used  Substance Use Topics   Alcohol use: Yes    Alcohol/week: 2.0 standard drinks of alcohol    Types: 2 Glasses of wine per week    Comment: occ   Drug use: No     Colonoscopy  Pap smear  DEXA scan  Lipid panel  Current Outpatient Medications  Medication Sig Dispense Refill   AMBULATORY NON FORMULARY MEDICATION CT-Mineral tablets 20 drops by mouth twice a day     AMBULATORY NON FORMULARY MEDICATION FungDX 1 capsul by mouth twice a day     AMBULATORY NON FORMULARY MEDICATION Bactrex 1 capsule by mouth twice a day     anastrozole (ARIMIDEX) 1 MG tablet Take 1 tablet (1 mg total) by mouth daily. 90 tablet 3   Ascorbic Acid (VITAMIN C PO) Take 1 tablet by mouth daily.     Biotin 1000 MCG tablet Take 1,000 mcg by mouth daily.     Cholecalciferol (VITAMIN D3  PO) Take 2 tablets by mouth daily.     Cyanocobalamin (B-12) 1000 MCG CAPS Take 1 tablet by mouth daily.     L-glutamine (ENDARI) 5 g PACK Powder Packet Take by mouth daily.     levothyroxine (SYNTHROID, LEVOTHROID) 175 MCG tablet Take 175 mcg by mouth daily before breakfast.     loratadine (CLARITIN) 10 MG tablet Take 10 mg by mouth daily.     MAGNESIUM PO Take 1 Dose by mouth at bedtime.     Multiple Minerals-Vitamins (BONE ESSENTIALS PO) Take 1 tablet by mouth daily.     Multiple Vitamins-Minerals (PHYTOMULTI PO) Take 3 tablets by mouth daily.     pantoprazole (PROTONIX) 40 MG tablet Take 1 tablet (40 mg total) by mouth 2 (two) times daily. 180 tablet 3   Probiotic Product (PROBIOTIC DAILY) CAPS Take 1 capsule by mouth daily.     Pseudoephedrine-guaiFENesin (MUCINEX D PO) Take 1 capsule by mouth daily.     SALINE NA Place into the nose as needed.     zinc gluconate 50 MG tablet Take 50 mg by mouth daily. 30 mg     No current facility-administered medications for this visit.    PHYSICAL EXAMINATION:  white woman who appears stated age  There were no vitals taken for this visit. There is no height or weight on file to calculate BMI.   ECOG PERFORMANCE STATUS: 1 - Symptomatic but completely ambulatory  Physical exam deferred, telephone visit   LABORATORY DATA: Lab Results  Component Value Date   WBC 5.9 01/14/2022   HGB 14.1 01/14/2022   HCT 41.7 01/14/2022   MCV 91.4 01/14/2022   PLT 225 01/14/2022      Chemistry      Component Value Date/Time   NA 136 01/14/2022 1119   NA 142 06/13/2013 1317   K 3.7 01/14/2022 1119   K 4.0 06/13/2013 1317   CL 100 01/14/2022 1119   CL 102 11/15/2012 1224   CO2 30 01/14/2022 1119   CO2 27 06/13/2013 1317   BUN 19 01/14/2022 1119   BUN 13.0 06/13/2013 1317   CREATININE 0.61 01/14/2022 1119   CREATININE 0.7 06/13/2013 1317      Component Value Date/Time   CALCIUM 9.6 01/14/2022 1119   CALCIUM 10.1 06/13/2013 1317   ALKPHOS 101  01/14/2022 1119   ALKPHOS 81 06/13/2013 1317   AST 21 01/14/2022  1119   AST 22 06/13/2013 1317   ALT 15 01/14/2022 1119   ALT 16 06/13/2013 1317   BILITOT 0.5 01/14/2022 1119   BILITOT 0.39 06/13/2013 1317      RESULTS: MM DIAG BREAST TOMO UNI RIGHT  Result Date: 10/21/2022 CLINICAL DATA:  72 year old female presenting for evaluation of nonfocal pain near the patient's lumpectomy site in the right breast. She is also experiencing pain in her right neck and shoulder. She has history of bilateral malignancies. She had left breast cancer in 2014 and right breast cancer in 2021. EXAM: DIGITAL DIAGNOSTIC UNILATERAL RIGHT MAMMOGRAM WITH TOMOSYNTHESIS; ULTRASOUND RIGHT BREAST LIMITED TECHNIQUE: Right digital diagnostic mammography and breast tomosynthesis was performed.; Targeted ultrasound examination of the right breast was performed COMPARISON:  Previous exam(s). ACR Breast Density Category c: The breasts are heterogeneously dense, which may obscure small masses. FINDINGS: No suspicious calcifications, masses or areas of distortion are seen in the right breast. Ultrasound targeted to the upper-outer right breast in the region of her lumpectomy site demonstrates normal fibroglandular tissue. Ultrasound of the right axilla demonstrates normal subcutaneous tissue. No abnormal lymph nodes or masses are identified. IMPRESSION: 1. There are no suspicious mammographic or targeted sonographic abnormalities in the region of pain in the right breast. 2.  No evidence of right breast malignancy. RECOMMENDATION: 1.  Routine annual mammography is due in May of 2024. 2. Clinical follow-up recommended for the tender area of concern in the right breast and axilla. Any further workup should be based on clinical grounds. I have discussed the findings and recommendations with the patient. If applicable, a reminder letter will be sent to the patient regarding the next appointment. BI-RADS CATEGORY  2: Benign. Electronically  Signed   By: Frederico Hamman M.D.   On: 10/21/2022 09:46  [place] LIMITED ULTRASOUND INCLUDING AXILLA RIGHT BREAST  Result Date: 10/21/2022 CLINICAL DATA:  72 year old female presenting for evaluation of nonfocal pain near the patient's lumpectomy site in the right breast. She is also experiencing pain in her right neck and shoulder. She has history of bilateral malignancies. She had left breast cancer in 2014 and right breast cancer in 2021. EXAM: DIGITAL DIAGNOSTIC UNILATERAL RIGHT MAMMOGRAM WITH TOMOSYNTHESIS; ULTRASOUND RIGHT BREAST LIMITED TECHNIQUE: Right digital diagnostic mammography and breast tomosynthesis was performed.; Targeted ultrasound examination of the right breast was performed COMPARISON:  Previous exam(s). ACR Breast Density Category c: The breasts are heterogeneously dense, which may obscure small masses. FINDINGS: No suspicious calcifications, masses or areas of distortion are seen in the right breast. Ultrasound targeted to the upper-outer right breast in the region of her lumpectomy site demonstrates normal fibroglandular tissue. Ultrasound of the right axilla demonstrates normal subcutaneous tissue. No abnormal lymph nodes or masses are identified. IMPRESSION: 1. There are no suspicious mammographic or targeted sonographic abnormalities in the region of pain in the right breast. 2.  No evidence of right breast malignancy. RECOMMENDATION: 1.  Routine annual mammography is due in May of 2024. 2. Clinical follow-up recommended for the tender area of concern in the right breast and axilla. Any further workup should be based on clinical grounds. I have discussed the findings and recommendations with the patient. If applicable, a reminder letter will be sent to the patient regarding the next appointment. BI-RADS CATEGORY  2: Benign. Electronically Signed   By: Frederico Hamman M.D.   On: 10/21/2022 09:46    ASSESSMENT:  72 y.o. Krista Butler, [place] woman:  LEFT BREAST CANCER:  (1) status post left  lumpectomy 11/23/2012  for ductal carcinoma in situ, grade 3, estrogen receptor 100% positive, progesterone receptor 0% positive, with -4 very close margins  (2) considered  NSABP B-43  but was found to be HER-2 negative ([id number])  (3) completed adjuvant radiation therapy on 02/08/2013.  (4) tamoxifen 20 mg daily started August 2014, discontinued August 2019  RIGHT BREAST CANCER: (5) status post right breast upper outer quadrant biopsy 11/13/2019 for a clinical T2 N0, stage IB invasive lobular carcinoma, E-cadherin negative, estrogen and progesterone receptor positive, with an MIB-1 of 20% and no HER-2 amplification  (6) genetics testing 12/03/2019 through the [facility] STAT + Multi-Cancer panels found no deleterious mutations in ATM, BRCA1, BRCA2, CDH1, CHEK2, PALB2, PTEN, STK11 and TP53. The Multi-Cancer Panel offered by [facility] includes sequencing and/or deletion duplication testing of the following 85 genes: AIP, ALK, APC, ATM, AXIN2,BAP1,  BARD1, BLM, BMPR1A, BRCA1, BRCA2, BRIP1, CASR, CDC73, CDH1, CDK4, CDKN1B, CDKN1C, CDKN2A (p14ARF), CDKN2A (p16INK4a), CEBPA, CHEK2, CTNNA1, DICER1, DIS3L2, EGFR (c.2369C>T, p.Thr790Met variant only), EPCAM (Deletion/duplication testing only), FH, FLCN, GATA2, GPC3, GREM1 (Promoter region deletion/duplication testing only), HOXB13 (c.251G>A, p.Gly84Glu), HRAS, KIT, MAX, MEN1, MET, MITF (c.952G>A, p.Glu318Lys variant only), MLH1, MSH2, MSH3, MSH6, MUTYH, NBN, NF1, NF2, NTHL1, PALB2, PDGFRA, PHOX2B, PMS2, POLD1, POLE, POT1, PRKAR1A, PTCH1, PTEN, RAD50, RAD51C, RAD51D, RB1, RECQL4, RET, RNF43, RUNX1, SDHAF2, SDHA (sequence changes only), SDHB, SDHC, SDHD, SMAD4, SMARCA4, SMARCB1, SMARCE1, STK11, SUFU, TERC, TERT, TMEM127, TP53, TSC1, TSC2, VHL, WRN and WT1.  (7) Oncotype testing on the 11/13/2019 biopsy showed a score of 22, predicting a recurrence outside the breast in the next 9 years of 8% if the patient only systemic therapy was tamoxifen or an  aromatase inhibitor for 5 years.  It also predicted no benefit from chemotherapy.  (8) status post right lumpectomy and sentinel lymph node sampling 12/26/2019 for a pT1c pN0, stage 1A invasive lobular carcinoma, with negative margins.  (a) a total of 6 axillary lymph nodes were removed.  (9) adjuvant radiation6/28/21 - 02/14/20  Site/dose:   The patient initially received a dose of 42.56 Gy in 16 fractions to the breast using whole-breast tangent fields. This was delivered using a 3-D conformal technique. The patient then received a boost to the seroma. This delivered an additional 8 Gy in 23fractions using a 3 field photon technique due to the depth of the seroma. The total dose was 50.56 Gy.   (10) anastrozole started 03/10/2020  (a) osteoporosis: considering denosumab/Prolia   (b) referred to pelvic rehab 05/23/2020   PLAN:  She is here for telephone visit to discuss his need of antiestrogen therapy.  I have recommended that she consider antiestrogen therapy given lobular phenotype and this being her second cancer.  I have also discussed about considering tamoxifen if she is having a lot of side effects with anastrozole.  We have reviewed the benefits versus risks with antiestrogen therapy.  She understands that without antiestrogen therapy there is a higher chance of recurrence both ipsilateral breast as well as distant metastasis.  She wanted to think about it and would like [place] to call her back in about 2 weeks.  If she does not want to go back on anastrozole, she can consider going back on tamoxifen.  She also proposed to take anastrozole every other day and we have discussed that there is no clinical trial demonstrating efficacy with every other day however it may be a better approach than not taking it.  All her questions were answered to the best my knowledge.  In the past she refused Prolia.  We did not talk about this today. RTC in 2 weeks, telephone  I connected with  Krista Butler on  11/03/22 by a  telemedicine application and verified that I am speaking with the correct person using two identifiers.   I discussed the limitations of evaluation and management by telemedicine. The patient expressed understanding and agreed to proceed.   Total time spent: 22 minutes  *Total Encounter Time as defined by the Centers for Medicare and Medicaid Services includes, in addition to the face-to-face time of a patient visit (documented in the note above) non-face-to-face time: obtaining and reviewing outside history, ordering and reviewing medications, tests or procedures, care coordination (communications with other health care professionals or caregivers) and documentation in the medical record.

## 2022-11-19 ENCOUNTER — Encounter: Payer: Self-pay | Admitting: Hematology and Oncology

## 2022-11-19 ENCOUNTER — Inpatient Hospital Stay (HOSPITAL_BASED_OUTPATIENT_CLINIC_OR_DEPARTMENT_OTHER): Payer: Medicare PPO | Admitting: Hematology and Oncology

## 2022-11-19 DIAGNOSIS — Z79811 Long term (current) use of aromatase inhibitors: Secondary | ICD-10-CM | POA: Diagnosis not present

## 2022-11-19 DIAGNOSIS — C50212 Malignant neoplasm of upper-inner quadrant of left female breast: Secondary | ICD-10-CM

## 2022-11-19 DIAGNOSIS — Z170 Estrogen receptor positive status [ER+]: Secondary | ICD-10-CM | POA: Diagnosis not present

## 2022-11-19 MED ORDER — TAMOXIFEN CITRATE 20 MG PO TABS: 20.0000 mg | ORAL_TABLET | Freq: Every day | ORAL | 0 refills | Status: DC

## 2022-11-19 NOTE — Progress Notes (Signed)
[facility]  [phone] Fax:([phone]    D: KENDA KLOEHN  DOB: 03/24/1951  MR#: [id number]  JYN#:[id number]   Patient Care Team: Adrian Prince, MD as PCP - General (Endocrinology) Dorothy Puffer, MD as Consulting Physician (Radiation Oncology) [name], MD as Consulting Physician (General Surgery) Richarda Overlie, MD as Consulting Physician (Obstetrics and Gynecology) Rachel Moulds, MD as Consulting Physician (Hematology and Oncology) OTHER MD:   CHIEF COMPLAINT: new estrogen receptor positive right breast cancer; history of noninvasive left breast cancer.  CURRENT THERAPY: anastrozole   INTERVAL HISTORY:  Keshawna returns today for follow up of her right breast cancer.  Since her last visit here, she apparently stopped anastrozole and has scheduled this telephone visit to review recommendations once again.   She says she is still undecided about what to do. Rest of the pertinent review of systems reviewed and negative   COVID 19 VACCINATION STATUS: infection 02/2020 and May 2022, no vaccinations   RIGHT BREAST CANCER HISTORY: From the original intake note:   She underwent bilateral diagnostic mammography with tomography and right breast ultrasonography at [facility] on 11/08/2019 showing: breast density category D; suspicious 2.5 cm right breast mass at 10 o'clock, muscle involvement cannot be excluded; no evidence of left breast malignancy or lymphadenopathy.  She proceeded to biopsy on 11/13/2019 of the right breast area in question. Pathology from the procedure ([id number]) revealed: invasive mammary carcinoma, grade 2, e-cadherin negative. Prognostic indicators significant for: estrogen receptor 95% positive, progesterone receptor 95% positive, both with a strong staining intensity. Proliferation marker Ki67 of 20%. Her2 negative by immunohistochemistry (1+).   History of left breast cancer: From Dr Feliz Beam earlier  note:  #1Patient noted some thickening of the right breast. She did not have any symptoms in the left breast. The thickening did not show any discrete mass or distortion on the right. However the patient was found to have numerous bilateral calcifications. Within the left breast there was a new linear/branching calcifications measuring 12 mm. This was suspicious for malignancy. There are also 1.1 cm suspicious calcifications in a heterogeneous group within the upper inner quadrant of the right breast. She underwent stereotactic biopsy of both of these areas  #2The biopsy on the right was negative and only noted to be a fibroadenoma with associated coarse calcifications. 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  #3 patient had a MRI performed that showed a clot area of enhancement in the retroareolar region of the left breast measuring 1 cm in maximum does mention corresponding to the known DCIS.  #4 patient is now status post left-sided lumpectomy on 11/23/2012. Her pathology revealed DCIS grade 3. Margins were negative with the closest margin being 1 mm from the inferior posterior margins. Tumor was ER ER positive PR positive.  #5 status post completion of radiation therapy to the left breast on 02/08/2013. #6 patient will proceed with after her radiation with chemoprevention with tamoxifen 20 mg daily. But this will begin after she completes radiation therapy.  #7 NSABP B. 43 clinical study enrollment. He did meet with the nurse and her tissue will be sent for HER-2 testing. HER-2 negative and therefore did not enroll on B. 43 trial  #8 patient began adjuvant curative intent tamoxifen 20 mg daily starting 02/26/2013. Total of 5 years of therapy is planned.   MEDICAL HISTORY: Past Medical History:  Diagnosis Date   Allergy  medicated for this   Arm fracture    LEFT arm; as a child   Breast cancer (HCC)     left   Diverticulosis    Family history of breast cancer    Family history of kidney cancer    GERD (gastroesophageal reflux disease)    History of radiation therapy 12/25/12-02/08/13   64.4 Gy to left breast   Osteoporosis    Personal history of radiation therapy    Thyroid disease    Wears glasses     SURGICAL HISTORY:  Past Surgical History:  Procedure Laterality Date   BREAST BIOPSY  1997   lt breast cyst   BREAST LUMPECTOMY Left 2014   BREAST LUMPECTOMY Right 2021   BREAST LUMPECTOMY WITH NEEDLE LOCALIZATION Left 11/23/2012   Procedure: BREAST LUMPECTOMY WITH NEEDLE LOCALIZATION;  Surgeon: [name], MD;  Location: [facility];  Service: General;  Laterality: Left;   BREAST LUMPECTOMY WITH RADIOACTIVE SEED AND SENTINEL LYMPH NODE BIOPSY Right 12/26/2019   Procedure: RIGHT BREAST LUMPECTOMY WITH RADIOACTIVE SEED AND RIGHT AXILLARY SENTINEL LYMPH NODE BIOPSY;  Surgeon: [name], MD;  Location: [facility];  Service: General;  Laterality: Right;   COLONOSCOPY     TONSILLECTOMY     UPPER GI ENDOSCOPY  2012    FAMILY HISTORY: Family History  Problem Relation Age of Onset   Heart disease Mother    Hypertension Mother    Stroke Father    Heart disease Father    Diabetes Father    Depression Sister    Hyperlipidemia Sister    Stroke Maternal Grandmother    Stroke Maternal Grandfather    Heart Problems Paternal Grandfather    Breast cancer Cousin        dx. younger than 44 (paternal cousin)   Cancer Cousin        dx. in her 74s, unknown type (paternal cousin)   Kidney cancer Cousin 53       (maternal cousin)   Other Cousin        genetic testing - unknown results (maternal cousin)   Colon cancer Neg Hx    Esophageal cancer Neg Hx    Pancreatic cancer Neg Hx    Stomach cancer Neg Hx    Liver disease Neg Hx   The patient's father died at the age of 45 following a stroke in the setting of severe heart disease. The patient's  mother died also from heart disease at the age of 68. The patient had no brothers, one sister. There is no history of breast or ovarian cancer in the family.   GYN HISTORY: Menarche age 47, first live birth age 39. The patient is GX P2. She went through menopause in her early 42s. She took hormone replacement briefly (approximately 2 years).   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 Her husband of 40+ years, Freida Busman, used to work for [employer] but is now a Surveyor, minerals. Daughter Laurena Bering is a homemaker in [place]. Son Annia Belt is a tool and dye Designer, television/film set. The patient has 5 grandchildren. She has one great grandchild. She attends the home church in [place].   ADVANCED DIRECTIVES: In the absence of any documentation to the contrary, the patient's spouse is their HCPOA.    HEALTH MAINTENANCE: Social History   Tobacco Use   Smoking status: Never   Smokeless tobacco: Never  Vaping Use   Vaping Use: Never used  Substance Use Topics  Alcohol use: Yes    Alcohol/week: 2.0 standard drinks of alcohol    Types: 2 Glasses of wine per week    Comment: occ   Drug use: No     Colonoscopy  Pap smear  DEXA scan  Lipid panel  Current Outpatient Medications  Medication Sig Dispense Refill   AMBULATORY NON FORMULARY MEDICATION CT-Mineral tablets 20 drops by mouth twice a day     AMBULATORY NON FORMULARY MEDICATION FungDX 1 capsul by mouth twice a day     AMBULATORY NON FORMULARY MEDICATION Bactrex 1 capsule by mouth twice a day     anastrozole (ARIMIDEX) 1 MG tablet Take 1 tablet (1 mg total) by mouth daily. 90 tablet 3   Ascorbic Acid (VITAMIN C PO) Take 1 tablet by mouth daily.     Biotin 1000 MCG tablet Take 1,000 mcg by mouth daily.     Cholecalciferol (VITAMIN D3 PO) Take 2 tablets by mouth daily.     Cyanocobalamin (B-12) 1000 MCG CAPS Take 1 tablet by mouth daily.     L-glutamine (ENDARI) 5 g PACK Powder Packet Take by mouth  daily.     levothyroxine (SYNTHROID, LEVOTHROID) 175 MCG tablet Take 175 mcg by mouth daily before breakfast.     loratadine (CLARITIN) 10 MG tablet Take 10 mg by mouth daily.     MAGNESIUM PO Take 1 Dose by mouth at bedtime.     Multiple Minerals-Vitamins (BONE ESSENTIALS PO) Take 1 tablet by mouth daily.     Multiple Vitamins-Minerals (PHYTOMULTI PO) Take 3 tablets by mouth daily.     pantoprazole (PROTONIX) 40 MG tablet Take 1 tablet (40 mg total) by mouth 2 (two) times daily. 180 tablet 3   Probiotic Product (PROBIOTIC DAILY) CAPS Take 1 capsule by mouth daily.     Pseudoephedrine-guaiFENesin (MUCINEX D PO) Take 1 capsule by mouth daily.     SALINE NA Place into the nose as needed.     zinc gluconate 50 MG tablet Take 50 mg by mouth daily. 30 mg     No current facility-administered medications for this visit.    PHYSICAL EXAMINATION:  white woman who appears stated age  There were no vitals taken for this visit. There is no height or weight on file to calculate BMI.   ECOG PERFORMANCE STATUS: 1 - Symptomatic but completely ambulatory  Physical exam deferred, telephone visit   LABORATORY DATA: Lab Results  Component Value Date   WBC 5.9 01/14/2022   HGB 14.1 01/14/2022   HCT 41.7 01/14/2022   MCV 91.4 01/14/2022   PLT 225 01/14/2022      Chemistry      Component Value Date/Time   NA 136 01/14/2022 1119   NA 142 06/13/2013 1317   K 3.7 01/14/2022 1119   K 4.0 06/13/2013 1317   CL 100 01/14/2022 1119   CL 102 11/15/2012 1224   CO2 30 01/14/2022 1119   CO2 27 06/13/2013 1317   BUN 19 01/14/2022 1119   BUN 13.0 06/13/2013 1317   CREATININE 0.61 01/14/2022 1119   CREATININE 0.7 06/13/2013 1317      Component Value Date/Time   CALCIUM 9.6 01/14/2022 1119   CALCIUM 10.1 06/13/2013 1317   ALKPHOS 101 01/14/2022 1119   ALKPHOS 81 06/13/2013 1317   AST 21 01/14/2022 1119   AST 22 06/13/2013 1317   ALT 15 01/14/2022 1119   ALT 16 06/13/2013 1317   BILITOT 0.5  01/14/2022 1119   BILITOT 0.39 06/13/2013 1317  RESULTS: MM DIAG BREAST TOMO UNI RIGHT  Result Date: 10/21/2022 CLINICAL DATA:  72 year old female presenting for evaluation of nonfocal pain near the patient's lumpectomy site in the right breast. She is also experiencing pain in her right neck and shoulder. She has history of bilateral malignancies. She had left breast cancer in 2014 and right breast cancer in 2021. EXAM: DIGITAL DIAGNOSTIC UNILATERAL RIGHT MAMMOGRAM WITH TOMOSYNTHESIS; ULTRASOUND RIGHT BREAST LIMITED TECHNIQUE: Right digital diagnostic mammography and breast tomosynthesis was performed.; Targeted ultrasound examination of the right breast was performed COMPARISON:  Previous exam(s). ACR Breast Density Category c: The breasts are heterogeneously dense, which may obscure small masses. FINDINGS: No suspicious calcifications, masses or areas of distortion are seen in the right breast. Ultrasound targeted to the upper-outer right breast in the region of her lumpectomy site demonstrates normal fibroglandular tissue. Ultrasound of the right axilla demonstrates normal subcutaneous tissue. No abnormal lymph nodes or masses are identified. IMPRESSION: 1. There are no suspicious mammographic or targeted sonographic abnormalities in the region of pain in the right breast. 2.  No evidence of right breast malignancy. RECOMMENDATION: 1.  Routine annual mammography is due in May of 2024. 2. Clinical follow-up recommended for the tender area of concern in the right breast and axilla. Any further workup should be based on clinical grounds. I have discussed the findings and recommendations with the patient. If applicable, a reminder letter will be sent to the patient regarding the next appointment. BI-RADS CATEGORY  2: Benign. Electronically Signed   By: Frederico Hamman M.D.   On: 10/21/2022 09:46  [place] LIMITED ULTRASOUND INCLUDING AXILLA RIGHT BREAST  Result Date: 10/21/2022 CLINICAL DATA:   72 year old female presenting for evaluation of nonfocal pain near the patient's lumpectomy site in the right breast. She is also experiencing pain in her right neck and shoulder. She has history of bilateral malignancies. She had left breast cancer in 2014 and right breast cancer in 2021. EXAM: DIGITAL DIAGNOSTIC UNILATERAL RIGHT MAMMOGRAM WITH TOMOSYNTHESIS; ULTRASOUND RIGHT BREAST LIMITED TECHNIQUE: Right digital diagnostic mammography and breast tomosynthesis was performed.; Targeted ultrasound examination of the right breast was performed COMPARISON:  Previous exam(s). ACR Breast Density Category c: The breasts are heterogeneously dense, which may obscure small masses. FINDINGS: No suspicious calcifications, masses or areas of distortion are seen in the right breast. Ultrasound targeted to the upper-outer right breast in the region of her lumpectomy site demonstrates normal fibroglandular tissue. Ultrasound of the right axilla demonstrates normal subcutaneous tissue. No abnormal lymph nodes or masses are identified. IMPRESSION: 1. There are no suspicious mammographic or targeted sonographic abnormalities in the region of pain in the right breast. 2.  No evidence of right breast malignancy. RECOMMENDATION: 1.  Routine annual mammography is due in May of 2024. 2. Clinical follow-up recommended for the tender area of concern in the right breast and axilla. Any further workup should be based on clinical grounds. I have discussed the findings and recommendations with the patient. If applicable, a reminder letter will be sent to the patient regarding the next appointment. BI-RADS CATEGORY  2: Benign. Electronically Signed   By: Frederico Hamman M.D.   On: 10/21/2022 09:46    ASSESSMENT:  72 y.o. Jacquenette Sh[place], [place] woman:  LEFT BREAST CANCER:  (1) status post left lumpectomy 11/23/2012 for ductal carcinoma in situ, grade 3, estrogen receptor 100% positive, progesterone receptor 0% positive, with -4 very close  margins  (2) considered  NSABP B-43  but was found to be HER-2 negative ([id number])  (  3) completed adjuvant radiation therapy on 02/08/2013.  (4) tamoxifen 20 mg daily started August 2014, discontinued August 2019  RIGHT BREAST CANCER: (5) status post right breast upper outer quadrant biopsy 11/13/2019 for a clinical T2 N0, stage IB invasive lobular carcinoma, E-cadherin negative, estrogen and progesterone receptor positive, with an MIB-1 of 20% and no HER-2 amplification  (6) genetics testing 12/03/2019 through the [facility] STAT + Multi-Cancer panels found no deleterious mutations in ATM, BRCA1, BRCA2, CDH1, CHEK2, PALB2, PTEN, STK11 and TP53. The Multi-Cancer Panel offered by [facility] includes sequencing and/or deletion duplication testing of the following 85 genes: AIP, ALK, APC, ATM, AXIN2,BAP1,  BARD1, BLM, BMPR1A, BRCA1, BRCA2, BRIP1, CASR, CDC73, CDH1, CDK4, CDKN1B, CDKN1C, CDKN2A (p14ARF), CDKN2A (p16INK4a), CEBPA, CHEK2, CTNNA1, DICER1, DIS3L2, EGFR (c.2369C>T, p.Thr790Met variant only), EPCAM (Deletion/duplication testing only), FH, FLCN, GATA2, GPC3, GREM1 (Promoter region deletion/duplication testing only), HOXB13 (c.251G>A, p.Gly84Glu), HRAS, KIT, MAX, MEN1, MET, MITF (c.952G>A, p.Glu318Lys variant only), MLH1, MSH2, MSH3, MSH6, MUTYH, NBN, NF1, NF2, NTHL1, PALB2, PDGFRA, PHOX2B, PMS2, POLD1, POLE, POT1, PRKAR1A, PTCH1, PTEN, RAD50, RAD51C, RAD51D, RB1, RECQL4, RET, RNF43, RUNX1, SDHAF2, SDHA (sequence changes only), SDHB, SDHC, SDHD, SMAD4, SMARCA4, SMARCB1, SMARCE1, STK11, SUFU, TERC, TERT, TMEM127, TP53, TSC1, TSC2, VHL, WRN and WT1.  (7) Oncotype testing on the 11/13/2019 biopsy showed a score of 22, predicting a recurrence outside the breast in the next 9 years of 8% if the patient only systemic therapy was tamoxifen or an aromatase inhibitor for 5 years.  It also predicted no benefit from chemotherapy.  (8) status post right lumpectomy and sentinel lymph node sampling  12/26/2019 for a pT1c pN0, stage 1A invasive lobular carcinoma, with negative margins.  (a) a total of 6 axillary lymph nodes were removed.  (9) adjuvant radiation6/28/21 - 02/14/20  Site/dose:   The patient initially received a dose of 42.56 Gy in 16 fractions to the breast using whole-breast tangent fields. This was delivered using a 3-D conformal technique. The patient then received a boost to the seroma. This delivered an additional 8 Gy in 83fractions using a 3 field photon technique due to the depth of the seroma. The total dose was 50.56 Gy.   (10) anastrozole started 03/10/2020  (a) osteoporosis: considering denosumab/Prolia   (b) referred to pelvic rehab 05/23/2020   PLAN:  She is here for telephone visit to discuss his need of antiestrogen therapy.  I have recommended that she consider antiestrogen therapy given lobular phenotype and this being her second cancer.   We once again discussed about adverse effects of tamoxifen including but not limited to DVT/PE, endometrial hyperplasia and malignancy, however we believe the benefits overweigh the risks She agreed to try tamoxifen, we sent a prescription to pharmacy of her choice. I will call her in approximately 6 weeks, telephone visit to follow up.  I connected with  MIAMI LATULIPPE on 11/19/22 by a  telemedicine application and verified that I am speaking with the correct person using two identifiers.   I discussed the limitations of evaluation and management by telemedicine. The patient expressed understanding and agreed to proceed.   Total time spent: 6  *Total Encounter Time as defined by the Centers for Medicare and Medicaid Services includes, in addition to the face-to-face time of a patient visit (documented in the note above) non-face-to-face time: obtaining and reviewing outside history, ordering and reviewing medications, tests or procedures, care coordination (communications with other health care professionals or caregivers)  and documentation in the medical record.

## 2022-11-22 ENCOUNTER — Telehealth: Payer: Self-pay | Admitting: Hematology and Oncology

## 2022-11-22 NOTE — Telephone Encounter (Signed)
Spoke with patient confirming upcoming appointment  

## 2023-01-03 ENCOUNTER — Inpatient Hospital Stay: Payer: Medicare PPO | Attending: Physician Assistant | Admitting: Hematology and Oncology

## 2023-01-03 DIAGNOSIS — Z170 Estrogen receptor positive status [ER+]: Secondary | ICD-10-CM

## 2023-01-03 DIAGNOSIS — C50212 Malignant neoplasm of upper-inner quadrant of left female breast: Secondary | ICD-10-CM

## 2023-01-03 NOTE — Progress Notes (Signed)
[facility]  [phone] [phone]) [phone]    D: Krista Butler  DOB: 02-23-1951  MR#: [id number]  IHK#:[id number]   Patient Care Team: Adrian Prince, MD as PCP - General (Endocrinology) Dorothy Puffer, MD as Consulting Physician (Radiation Oncology) [name], MD as Consulting Physician (General Surgery) Richarda Overlie, MD as Consulting Physician (Obstetrics and Gynecology) Rachel Moulds, MD as Consulting Physician (Hematology and Oncology) OTHER MD:   CHIEF COMPLAINT: new estrogen receptor positive right breast cancer; history of noninvasive left breast cancer.  CURRENT THERAPY: anastrozole   INTERVAL HISTORY:  Krista Butler returns today for follow up of her right breast cancer.  Since her last visit here, She has been taking tamoxifen and she is tolerating it well. She couldn't tolerate anastrozole because of arthralgias and brain fog, fatigue. She says she has no side effects with tamoxifen although she has noticed some weird things like itching of hands and feet. Rest of the pertinent review of systems reviewed and negative   COVID 19 VACCINATION STATUS: infection 02/2020 and May 2022, no vaccinations   RIGHT BREAST CANCER HISTORY: From the original intake note:   She underwent bilateral diagnostic mammography with tomography and right breast ultrasonography at [facility] on 11/08/2019 showing: breast density category D; suspicious 2.5 cm right breast mass at 10 o'clock, muscle involvement cannot be excluded; no evidence of left breast malignancy or lymphadenopathy.  She proceeded to biopsy on 11/13/2019 of the right breast area in question. Pathology from the procedure ([id number]) revealed: invasive mammary carcinoma, grade 2, e-cadherin negative. Prognostic indicators significant for: estrogen receptor 95% positive, progesterone receptor 95% positive, both with a strong staining intensity. Proliferation marker Ki67 of 20%. Her2 negative by  immunohistochemistry (1+).   History of left breast cancer: From Dr Feliz Beam earlier note:  #1Patient noted some thickening of the right breast. She did not have any symptoms in the left breast. The thickening did not show any discrete mass or distortion on the right. However the patient was found to have numerous bilateral calcifications. Within the left breast there was a new linear/branching calcifications measuring 12 mm. This was suspicious for malignancy. There are also 1.1 cm suspicious calcifications in a heterogeneous group within the upper inner quadrant of the right breast. She underwent stereotactic biopsy of both of these areas  #2The biopsy on the right was negative and only noted to be a fibroadenoma with associated coarse calcifications. 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  #3 patient had a MRI performed that showed a clot area of enhancement in the retroareolar region of the left breast measuring 1 cm in maximum does mention corresponding to the known DCIS.  #4 patient is now status post left-sided lumpectomy on 11/23/2012. Her pathology revealed DCIS grade 3. Margins were negative with the closest margin being 1 mm from the inferior posterior margins. Tumor was ER ER positive PR positive.  #5 status post completion of radiation therapy to the left breast on 02/08/2013. #6 patient will proceed with after her radiation with chemoprevention with tamoxifen 20 mg daily. But this will begin after she completes radiation therapy.  #7 NSABP B. 43 clinical study enrollment. He did meet with the nurse and her tissue will be sent for HER-2 testing. HER-2 negative and therefore did not enroll on B. 43 trial  #8 patient began adjuvant curative intent tamoxifen 20 mg daily starting 02/26/2013. Total of 5 years of therapy  is planned.   MEDICAL HISTORY: Past Medical History:  Diagnosis Date    Allergy    medicated for this   Arm fracture    LEFT arm; as a child   Breast cancer (HCC)    left   Diverticulosis    Family history of breast cancer    Family history of kidney cancer    GERD (gastroesophageal reflux disease)    History of radiation therapy 12/25/12-02/08/13   64.4 Gy to left breast   Osteoporosis    Personal history of radiation therapy    Thyroid disease    Wears glasses     SURGICAL HISTORY:  Past Surgical History:  Procedure Laterality Date   BREAST BIOPSY  1997   lt breast cyst   BREAST LUMPECTOMY Left 2014   BREAST LUMPECTOMY Right 2021   BREAST LUMPECTOMY WITH NEEDLE LOCALIZATION Left 11/23/2012   Procedure: BREAST LUMPECTOMY WITH NEEDLE LOCALIZATION;  Surgeon: [name], MD;  Location: [facility];  Service: General;  Laterality: Left;   BREAST LUMPECTOMY WITH RADIOACTIVE SEED AND SENTINEL LYMPH NODE BIOPSY Right 12/26/2019   Procedure: RIGHT BREAST LUMPECTOMY WITH RADIOACTIVE SEED AND RIGHT AXILLARY SENTINEL LYMPH NODE BIOPSY;  Surgeon: [name], MD;  Location: [facility];  Service: General;  Laterality: Right;   COLONOSCOPY     TONSILLECTOMY     UPPER GI ENDOSCOPY  2012    FAMILY HISTORY: Family History  Problem Relation Age of Onset   Heart disease Mother    Hypertension Mother    Stroke Father    Heart disease Father    Diabetes Father    Depression Sister    Hyperlipidemia Sister    Stroke Maternal Grandmother    Stroke Maternal Grandfather    Heart Problems Paternal Grandfather    Breast cancer Cousin        dx. younger than 61 (paternal cousin)   Cancer Cousin        dx. in her 45s, unknown type (paternal cousin)   Kidney cancer Cousin 13       (maternal cousin)   Other Cousin        genetic testing - unknown results (maternal cousin)   Colon cancer Neg Hx    Esophageal cancer Neg Hx    Pancreatic cancer Neg Hx    Stomach cancer Neg Hx    Liver disease Neg Hx   The patient's  father died at the age of 70 following a stroke in the setting of severe heart disease. The patient's mother died also from heart disease at the age of 20. The patient had no brothers, one sister. There is no history of breast or ovarian cancer in the family.   GYN HISTORY: Menarche age 62, first live birth age 53. The patient is GX P2. She went through menopause in her early 4s. She took hormone replacement briefly (approximately 2 years).   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 Her husband of 40+ years, Freida Busman, used to work for [employer] but is now a Surveyor, minerals. Daughter Laurena Bering is a homemaker in [place]. Son Annia Belt is a tool and dye Designer, television/film set. The patient has 5 grandchildren. She has one great grandchild. She attends the home church in [place].   ADVANCED DIRECTIVES: In the absence of any documentation to the contrary, the patient's spouse is their HCPOA.    HEALTH MAINTENANCE: Social History   Tobacco Use   Smoking status: Never  Smokeless tobacco: Never  Vaping Use   Vaping Use: Never used  Substance Use Topics   Alcohol use: Yes    Alcohol/week: 2.0 standard drinks of alcohol    Types: 2 Glasses of wine per week    Comment: occ   Drug use: No     Colonoscopy  Pap smear  DEXA scan  Lipid panel  Current Outpatient Medications  Medication Sig Dispense Refill   AMBULATORY NON FORMULARY MEDICATION CT-Mineral tablets 20 drops by mouth twice a day     AMBULATORY NON FORMULARY MEDICATION FungDX 1 capsul by mouth twice a day     AMBULATORY NON FORMULARY MEDICATION Bactrex 1 capsule by mouth twice a day     anastrozole (ARIMIDEX) 1 MG tablet Take 1 tablet (1 mg total) by mouth daily. 90 tablet 3   Ascorbic Acid (VITAMIN C PO) Take 1 tablet by mouth daily.     Biotin 1000 MCG tablet Take 1,000 mcg by mouth daily.     Cholecalciferol (VITAMIN D3 PO) Take 2 tablets by mouth daily.     Cyanocobalamin (B-12) 1000 MCG  CAPS Take 1 tablet by mouth daily.     L-glutamine (ENDARI) 5 g PACK Powder Packet Take by mouth daily.     levothyroxine (SYNTHROID, LEVOTHROID) 175 MCG tablet Take 175 mcg by mouth daily before breakfast.     loratadine (CLARITIN) 10 MG tablet Take 10 mg by mouth daily.     MAGNESIUM PO Take 1 Dose by mouth at bedtime.     Multiple Minerals-Vitamins (BONE ESSENTIALS PO) Take 1 tablet by mouth daily.     Multiple Vitamins-Minerals (PHYTOMULTI PO) Take 3 tablets by mouth daily.     pantoprazole (PROTONIX) 40 MG tablet Take 1 tablet (40 mg total) by mouth 2 (two) times daily. 180 tablet 3   Probiotic Product (PROBIOTIC DAILY) CAPS Take 1 capsule by mouth daily.     Pseudoephedrine-guaiFENesin (MUCINEX D PO) Take 1 capsule by mouth daily.     SALINE NA Place into the nose as needed.     tamoxifen (NOLVADEX) 20 MG tablet Take 1 tablet (20 mg total) by mouth daily. 60 tablet 0   zinc gluconate 50 MG tablet Take 50 mg by mouth daily. 30 mg     No current facility-administered medications for this visit.    PHYSICAL EXAMINATION:  white Butler who appears stated age  There were no vitals taken for this visit. There is no height or weight on file to calculate BMI.   ECOG PERFORMANCE STATUS: 1 - Symptomatic but completely ambulatory  Physical exam deferred, telephone visit   LABORATORY DATA: Lab Results  Component Value Date   WBC 5.9 01/14/2022   HGB 14.1 01/14/2022   HCT 41.7 01/14/2022   MCV 91.4 01/14/2022   PLT 225 01/14/2022      Chemistry      Component Value Date/Time   NA 136 01/14/2022 1119   NA 142 06/13/2013 1317   K 3.7 01/14/2022 1119   K 4.0 06/13/2013 1317   CL 100 01/14/2022 1119   CL 102 11/15/2012 1224   CO2 30 01/14/2022 1119   CO2 27 06/13/2013 1317   BUN 19 01/14/2022 1119   BUN 13.0 06/13/2013 1317   CREATININE 0.61 01/14/2022 1119   CREATININE 0.7 06/13/2013 1317      Component Value Date/Time   CALCIUM 9.6 01/14/2022 1119   CALCIUM 10.1 06/13/2013  1317   ALKPHOS 101 01/14/2022 1119   ALKPHOS 81 06/13/2013  1317   AST 21 01/14/2022 1119   AST 22 06/13/2013 1317   ALT 15 01/14/2022 1119   ALT 16 06/13/2013 1317   BILITOT 0.5 01/14/2022 1119   BILITOT 0.39 06/13/2013 1317      RESULTS: No results found.   ASSESSMENT:  72 y.o. Krista Butler, Krista Butler:  LEFT BREAST CANCER:  (1) status post left lumpectomy 11/23/2012 for ductal carcinoma in situ, grade 3, estrogen receptor 100% positive, progesterone receptor 0% positive, with -4 very close margins  (2) considered  NSABP B-43  but was found to be HER-2 negative ([id number])  (3) completed adjuvant radiation therapy on 02/08/2013.  (4) tamoxifen 20 mg daily started August 2014, discontinued August 2019  RIGHT BREAST CANCER: (5) status post right breast upper outer quadrant biopsy 11/13/2019 for a clinical T2 N0, stage IB invasive lobular carcinoma, E-cadherin negative, estrogen and progesterone receptor positive, with an MIB-1 of 20% and no HER-2 amplification  (6) genetics testing 12/03/2019 through the [facility] STAT + Multi-Cancer panels found no deleterious mutations in ATM, BRCA1, BRCA2, CDH1, CHEK2, PALB2, PTEN, STK11 and TP53. The Multi-Cancer Panel offered by [facility] includes sequencing and/or deletion duplication testing of the following 85 genes: AIP, ALK, APC, ATM, AXIN2,BAP1,  BARD1, BLM, BMPR1A, BRCA1, BRCA2, BRIP1, CASR, CDC73, CDH1, CDK4, CDKN1B, CDKN1C, CDKN2A (p14ARF), CDKN2A (p16INK4a), CEBPA, CHEK2, CTNNA1, DICER1, DIS3L2, EGFR (c.2369C>T, p.Thr790Met variant only), EPCAM (Deletion/duplication testing only), FH, FLCN, GATA2, GPC3, GREM1 (Promoter region deletion/duplication testing only), HOXB13 (c.251G>A, p.Gly84Glu), HRAS, KIT, MAX, MEN1, MET, MITF (c.952G>A, p.Glu318Lys variant only), MLH1, MSH2, MSH3, MSH6, MUTYH, NBN, NF1, NF2, NTHL1, PALB2, PDGFRA, PHOX2B, PMS2, POLD1, POLE, POT1, PRKAR1A, PTCH1, PTEN, RAD50, RAD51C, RAD51D, RB1, RECQL4, RET, RNF43, RUNX1,  SDHAF2, SDHA (sequence changes only), SDHB, SDHC, SDHD, SMAD4, SMARCA4, SMARCB1, SMARCE1, STK11, SUFU, TERC, TERT, TMEM127, TP53, TSC1, TSC2, VHL, WRN and WT1.  (7) Oncotype testing on the 11/13/2019 biopsy showed a score of 22, predicting a recurrence outside the breast in the next 9 years of 8% if the patient only systemic therapy was tamoxifen or an aromatase inhibitor for 5 years.  It also predicted no benefit from chemotherapy.  (8) status post right lumpectomy and sentinel lymph node sampling 12/26/2019 for a pT1c pN0, stage 1A invasive lobular carcinoma, with negative margins.  (a) a total of 6 axillary lymph nodes were removed.  (9) adjuvant radiation6/28/21 - 02/14/20  Site/dose:   The patient initially received a dose of 42.56 Gy in 16 fractions to the breast using whole-breast tangent fields. This was delivered using a 3-D conformal technique. The patient then received a boost to the seroma. This delivered an additional 8 Gy in 46fractions using a 3 field photon technique due to the depth of the seroma. The total dose was 50.56 Gy.   (10) anastrozole started 03/10/2020  (a) osteoporosis: considering denosumab/Prolia   (b) referred to pelvic rehab 05/23/2020   PLAN:  She is tolerating tamoxifen very well.  She denies any adverse effects today for me.  We have previous daily discussed about adverse effects of tamoxifen including but not limited to DVT/PE, endometrial hyperplasia and malignancy, however we believe the benefits overweigh the risks She said the next follow-up that has been scheduled may not work well for her and she will call and reschedule.   She is willing to continue tamoxifen until her next follow-up with [place].  I connected with  Krista Butler on 01/03/23 by a  telemedicine application and verified that I am speaking with the  correct person using two identifiers.   I discussed the limitations of evaluation and management by telemedicine. The patient expressed  understanding and agreed to proceed.   Total time spent: 8 min  *Total Encounter Time as defined by the Centers for Medicare and Medicaid Services includes, in addition to the face-to-face time of a patient visit (documented in the note above) non-face-to-face time: obtaining and reviewing outside history, ordering and reviewing medications, tests or procedures, care coordination (communications with other health care professionals or caregivers) and documentation in the medical record.

## 2023-01-11 ENCOUNTER — Telehealth: Payer: Self-pay | Admitting: Hematology and Oncology

## 2023-01-13 ENCOUNTER — Other Ambulatory Visit: Payer: Self-pay | Admitting: Hematology and Oncology

## 2023-01-13 DIAGNOSIS — Z0000 Encounter for general adult medical examination without abnormal findings: Secondary | ICD-10-CM

## 2023-01-17 ENCOUNTER — Other Ambulatory Visit: Payer: Medicare PPO

## 2023-01-17 ENCOUNTER — Ambulatory Visit: Payer: Medicare PPO | Admitting: Hematology and Oncology

## 2023-02-02 ENCOUNTER — Ambulatory Visit
Admission: RE | Admit: 2023-02-02 | Discharge: 2023-02-02 | Disposition: A | Discharge status: Home / Self Care | Payer: Medicare PPO | Source: Ambulatory Visit | Attending: Hematology and Oncology | Admitting: Hematology and Oncology

## 2023-02-02 ENCOUNTER — Other Ambulatory Visit: Payer: Self-pay | Admitting: Hematology and Oncology

## 2023-02-02 DIAGNOSIS — Z0000 Encounter for general adult medical examination without abnormal findings: Secondary | ICD-10-CM

## 2023-02-04 ENCOUNTER — Other Ambulatory Visit: Payer: Self-pay | Admitting: Hematology and Oncology

## 2023-02-04 DIAGNOSIS — R928 Other abnormal and inconclusive findings on diagnostic imaging of breast: Secondary | ICD-10-CM

## 2023-02-07 ENCOUNTER — Other Ambulatory Visit: Payer: Self-pay

## 2023-02-07 DIAGNOSIS — C50212 Malignant neoplasm of upper-inner quadrant of left female breast: Secondary | ICD-10-CM

## 2023-02-08 ENCOUNTER — Telehealth: Payer: Self-pay | Admitting: *Deleted

## 2023-02-08 ENCOUNTER — Inpatient Hospital Stay: Payer: Medicare PPO | Attending: Physician Assistant

## 2023-02-08 ENCOUNTER — Inpatient Hospital Stay: Payer: Medicare PPO | Admitting: Hematology and Oncology

## 2023-02-08 ENCOUNTER — Other Ambulatory Visit: Payer: Self-pay | Admitting: *Deleted

## 2023-02-08 ENCOUNTER — Other Ambulatory Visit: Payer: Self-pay

## 2023-02-08 ENCOUNTER — Encounter: Payer: Self-pay | Admitting: Hematology and Oncology

## 2023-02-08 VITALS — BP 108/52 | HR 80 | Temp 97.3°F | Resp 16 | Wt 143.6 lb

## 2023-02-08 DIAGNOSIS — Z79810 Long term (current) use of selective estrogen receptor modulators (SERMs): Secondary | ICD-10-CM | POA: Diagnosis not present

## 2023-02-08 DIAGNOSIS — Z923 Personal history of irradiation: Secondary | ICD-10-CM | POA: Diagnosis not present

## 2023-02-08 DIAGNOSIS — C50411 Malignant neoplasm of upper-outer quadrant of right female breast: Secondary | ICD-10-CM | POA: Insufficient documentation

## 2023-02-08 DIAGNOSIS — Z170 Estrogen receptor positive status [ER+]: Secondary | ICD-10-CM | POA: Diagnosis not present

## 2023-02-08 DIAGNOSIS — Z803 Family history of malignant neoplasm of breast: Secondary | ICD-10-CM | POA: Diagnosis not present

## 2023-02-08 DIAGNOSIS — Z8051 Family history of malignant neoplasm of kidney: Secondary | ICD-10-CM | POA: Insufficient documentation

## 2023-02-08 DIAGNOSIS — C50212 Malignant neoplasm of upper-inner quadrant of left female breast: Secondary | ICD-10-CM | POA: Diagnosis not present

## 2023-02-08 LAB — CBC WITH DIFFERENTIAL (CANCER CENTER ONLY)
Abs Immature Granulocytes: 0 10*3/uL (ref 0.00–0.07)
Basophils Absolute: 0.1 10*3/uL (ref 0.0–0.1)
Basophils Relative: 1 %
Eosinophils Absolute: 0.1 10*3/uL (ref 0.0–0.5)
Eosinophils Relative: 3 %
HCT: 41.1 % (ref 36.0–46.0)
Hemoglobin: 13.6 g/dL (ref 12.0–15.0)
Immature Granulocytes: 0 %
Lymphocytes Relative: 38 %
Lymphs Abs: 1.8 10*3/uL (ref 0.7–4.0)
MCH: 30.3 pg (ref 26.0–34.0)
MCHC: 33.1 g/dL (ref 30.0–36.0)
MCV: 91.5 fL (ref 80.0–100.0)
Monocytes Absolute: 0.5 10*3/uL (ref 0.1–1.0)
Monocytes Relative: 10 %
Neutro Abs: 2.3 10*3/uL (ref 1.7–7.7)
Neutrophils Relative %: 48 %
Platelet Count: 240 10*3/uL (ref 150–400)
RBC: 4.49 MIL/uL (ref 3.87–5.11)
RDW: 11.8 % (ref 11.5–15.5)
WBC Count: 4.7 10*3/uL (ref 4.0–10.5)
nRBC: 0 % (ref 0.0–0.2)

## 2023-02-08 LAB — CMP (CANCER CENTER ONLY)
ALT: 13 U/L (ref 0–44)
AST: 18 U/L (ref 15–41)
Albumin: 4 g/dL (ref 3.5–5.0)
Alkaline Phosphatase: 98 U/L (ref 38–126)
Anion gap: 6 (ref 5–15)
BUN: 16 mg/dL (ref 8–23)
CO2: 27 mmol/L (ref 22–32)
Calcium: 9.4 mg/dL (ref 8.9–10.3)
Chloride: 107 mmol/L (ref 98–111)
Creatinine: 0.65 mg/dL (ref 0.44–1.00)
GFR, Estimated: >60 mL/min (ref >60)
Glucose, Bld: 86 mg/dL (ref 70–99)
Potassium: 4.2 mmol/L (ref 3.5–5.1)
Sodium: 140 mmol/L (ref 135–145)
Total Bilirubin: 0.4 mg/dL (ref 0.3–1.2)
Total Protein: 7.1 g/dL (ref 6.5–8.1)

## 2023-02-08 NOTE — Progress Notes (Signed)
Krista Butler  [phone] Fax:([phone]    D: Krista Butler  DOB: Feb 26, 1951  MR#: [id number]  HYQ#:[id number]   Patient Care Team: Adrian Prince, MD as PCP - General (Endocrinology) Dorothy Puffer, MD as Consulting Physician (Radiation Oncology) [name], MD as Consulting Physician (General Surgery) Richarda Overlie, MD as Consulting Physician (Obstetrics and Gynecology) Rachel Moulds, MD as Consulting Physician (Hematology and Oncology) OTHER MD:  CHIEF COMPLAINT: Estrogen receptor positive right breast cancer; history of noninvasive left breast cancer.  CURRENT THERAPY: Tamoxifen,  INTERVAL HISTORY:  Krista Butler returns today for follow up of her right breast cancer.  Since her last visit here, She has been taking tamoxifen and she is tolerating it well.  She couldn't tolerate anastrozole because of arthralgias and brain fog, fatigue.  She is doing really well on tamoxifen.  She denies any major issues today.  She is understandably worried about her most recent mammogram and the need for diagnostic mammogram and ultrasound.  She had questions about the risk of recurrence, prognosis in general etc.  Rest of the pertinent 10 point ROS reviewed and negative   COVID 19 VACCINATION STATUS: infection 02/2020 and May 2022, no vaccinations   RIGHT BREAST CANCER HISTORY: From the original intake note:   She underwent bilateral diagnostic mammography with tomography and right breast ultrasonography at [facility] on 11/08/2019 showing: breast density category D; suspicious 2.5 cm right breast mass at 10 o'clock, muscle involvement cannot be excluded; no evidence of left breast malignancy or lymphadenopathy.  She proceeded to biopsy on 11/13/2019 of the right breast area in question. Pathology from the procedure ([id number]) revealed: invasive mammary carcinoma, grade 2, e-cadherin negative. Prognostic indicators significant for: estrogen receptor 95%  positive, progesterone receptor 95% positive, both with a strong staining intensity. Proliferation marker Ki67 of 20%. Her2 negative by immunohistochemistry (1+).   History of left breast cancer: From Dr Feliz Beam earlier note:  #1Patient noted some thickening of the right breast. She did not have any symptoms in the left breast. The thickening did not show any discrete mass or distortion on the right. However the patient was found to have numerous bilateral calcifications. Within the left breast there was a new linear/branching calcifications measuring 12 mm. This was suspicious for malignancy. There are also 1.1 cm suspicious calcifications in a heterogeneous group within the upper inner quadrant of the right breast. She underwent stereotactic biopsy of both of these areas  #2The biopsy on the right was negative and only noted to be a fibroadenoma with associated coarse calcifications. 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  #3 patient had a MRI performed that showed a clot area of enhancement in the retroareolar region of the left breast measuring 1 cm in maximum does mention corresponding to the known DCIS.  #4 patient is now status post left-sided lumpectomy on 11/23/2012. Her pathology revealed DCIS grade 3. Margins were negative with the closest margin being 1 mm from the inferior posterior margins. Tumor was ER ER positive PR positive.  #5 status post completion of radiation therapy to the left breast on 02/08/2013. #6 patient will proceed with after her radiation with chemoprevention with tamoxifen 20 mg daily. But this will begin after she completes radiation therapy.  #7 NSABP B. 43 clinical study enrollment. He did meet with the nurse and her tissue will be sent for HER-2 testing. HER-2 negative and therefore did not enroll  on B. 43 trial  #8 patient began adjuvant curative intent tamoxifen 20 mg  daily starting 02/26/2013. Total of 5 years of therapy is planned.   MEDICAL HISTORY: Past Medical History:  Diagnosis Date   Allergy    medicated for this   Arm fracture    LEFT arm; as a child   Breast cancer (HCC)    left   Diverticulosis    Family history of breast cancer    Family history of kidney cancer    GERD (gastroesophageal reflux disease)    History of radiation therapy 12/25/12-02/08/13   64.4 Gy to left breast   Osteoporosis    Personal history of radiation therapy    Thyroid disease    Wears glasses     SURGICAL HISTORY:  Past Surgical History:  Procedure Laterality Date   BREAST BIOPSY  1997   lt breast cyst   BREAST LUMPECTOMY Left 2014   BREAST LUMPECTOMY Right 2021   BREAST LUMPECTOMY WITH NEEDLE LOCALIZATION Left 11/23/2012   Procedure: BREAST LUMPECTOMY WITH NEEDLE LOCALIZATION;  Surgeon: [name], MD;  Location: [facility];  Service: General;  Laterality: Left;   BREAST LUMPECTOMY WITH RADIOACTIVE SEED AND SENTINEL LYMPH NODE BIOPSY Right 12/26/2019   Procedure: RIGHT BREAST LUMPECTOMY WITH RADIOACTIVE SEED AND RIGHT AXILLARY SENTINEL LYMPH NODE BIOPSY;  Surgeon: [name], MD;  Location: [facility];  Service: General;  Laterality: Right;   COLONOSCOPY     TONSILLECTOMY     UPPER GI ENDOSCOPY  2012    FAMILY HISTORY: Family History  Problem Relation Age of Onset   Heart disease Mother    Hypertension Mother    Stroke Father    Heart disease Father    Diabetes Father    Depression Sister    Hyperlipidemia Sister    Stroke Maternal Grandmother    Stroke Maternal Grandfather    Heart Problems Paternal Grandfather    Breast cancer Cousin        dx. younger than 48 (paternal cousin)   Cancer Cousin        dx. in her 33s, unknown type (paternal cousin)   Kidney cancer Cousin 29       (maternal cousin)   Other Cousin        genetic testing - unknown results (maternal cousin)   Colon cancer Neg  Hx    Esophageal cancer Neg Hx    Pancreatic cancer Neg Hx    Stomach cancer Neg Hx    Liver disease Neg Hx   The patient's father died at the age of 25 following a stroke in the setting of severe heart disease. The patient's mother died also from heart disease at the age of 61. The patient had no brothers, one sister. There is no history of breast or ovarian cancer in the family.   GYN HISTORY: Menarche age 9, first live birth age 89. The patient is GX P2. She went through menopause in her early 68s. She took hormone replacement briefly (approximately 2 years).   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 Her husband of 40+ years, Krista [facility], used to work for [employer] but is now a Surveyor, minerals. Daughter Krista Butler is a homemaker in [place]. Son Krista Butler is a tool and dye Designer, television/film set. The patient has 5 grandchildren. She has one great grandchild. She attends the home church in [place].   ADVANCED DIRECTIVES: In the absence of any documentation to the  contrary, the patient's spouse is their HCPOA.    HEALTH MAINTENANCE: Social History   Tobacco Use   Smoking status: Never   Smokeless tobacco: Never  Vaping Use   Vaping status: Never Used  Substance Use Topics   Alcohol use: Yes    Alcohol/week: 2.0 standard drinks of alcohol    Types: 2 Glasses of wine per week    Comment: occ   Drug use: No     Colonoscopy  Pap smear  DEXA scan  Lipid panel  Current Outpatient Medications  Medication Sig Dispense Refill   AMBULATORY NON FORMULARY MEDICATION CT-Mineral tablets 20 drops by mouth twice a day     AMBULATORY NON FORMULARY MEDICATION FungDX 1 capsul by mouth twice a day     AMBULATORY NON FORMULARY MEDICATION Bactrex 1 capsule by mouth twice a day     anastrozole (ARIMIDEX) 1 MG tablet Take 1 tablet (1 mg total) by mouth daily. 90 tablet 3   Ascorbic Acid (VITAMIN C PO) Take 1 tablet by mouth daily.     Biotin 1000 MCG tablet  Take 1,000 mcg by mouth daily.     Cholecalciferol (VITAMIN D3 PO) Take 2 tablets by mouth daily.     Cyanocobalamin (B-12) 1000 MCG CAPS Take 1 tablet by mouth daily.     L-glutamine (ENDARI) 5 g PACK Powder Packet Take by mouth daily.     levothyroxine (SYNTHROID, LEVOTHROID) 175 MCG tablet Take 175 mcg by mouth daily before breakfast.     loratadine (CLARITIN) 10 MG tablet Take 10 mg by mouth daily.     MAGNESIUM PO Take 1 Dose by mouth at bedtime.     Multiple Minerals-Vitamins (BONE ESSENTIALS PO) Take 1 tablet by mouth daily.     Multiple Vitamins-Minerals (PHYTOMULTI PO) Take 3 tablets by mouth daily.     pantoprazole (PROTONIX) 40 MG tablet Take 1 tablet (40 mg total) by mouth 2 (two) times daily. 180 tablet 3   Probiotic Product (PROBIOTIC DAILY) CAPS Take 1 capsule by mouth daily.     Pseudoephedrine-guaiFENesin (MUCINEX D PO) Take 1 capsule by mouth daily.     SALINE NA Place into the nose as needed.     tamoxifen (NOLVADEX) 20 MG tablet TAKE 1 TABLET BY MOUTH DAILY. 60 tablet 0   zinc gluconate 50 MG tablet Take 50 mg by mouth daily. 30 mg     No current facility-administered medications for this visit.    PHYSICAL EXAMINATION:  white Butler who appears stated age  Blood pressure (!) 108/52, pulse 80, temperature (!) 97.3 F (36.3 C), temperature source Temporal, resp. rate 16, weight 143 lb 9.6 oz (65.1 kg), SpO2 95%. Body mass index is 23.18 kg/m.   ECOG PERFORMANCE STATUS: 1 - Symptomatic but completely ambulatory  General appearance alert, oriented and in no acute distress Neck: No regional adenopathy Chest: Bilateral breast inspection palpated.  No palpable masses or regional adenopathy No lower extremity edema  LABORATORY DATA: Lab Results  Component Value Date   WBC 4.7 02/08/2023   HGB 13.6 02/08/2023   HCT 41.1 02/08/2023   MCV 91.5 02/08/2023   PLT 240 02/08/2023      Chemistry      Component Value Date/Time   NA 136 01/14/2022 1119   NA 142  06/13/2013 1317   K 3.7 01/14/2022 1119   K 4.0 06/13/2013 1317   CL 100 01/14/2022 1119   CL 102 11/15/2012 1224   CO2 30 01/14/2022 1119  CO2 27 06/13/2013 1317   BUN 19 01/14/2022 1119   BUN 13.0 06/13/2013 1317   CREATININE 0.61 01/14/2022 1119   CREATININE 0.7 06/13/2013 1317      Component Value Date/Time   CALCIUM 9.6 01/14/2022 1119   CALCIUM 10.1 06/13/2013 1317   ALKPHOS 101 01/14/2022 1119   ALKPHOS 81 06/13/2013 1317   AST 21 01/14/2022 1119   AST 22 06/13/2013 1317   ALT 15 01/14/2022 1119   ALT 16 06/13/2013 1317   BILITOT 0.5 01/14/2022 1119   BILITOT 0.39 06/13/2013 1317      RESULTS: MM 3D SCREENING MAMMOGRAM BILATERAL BREAST  Result Date: 02/03/2023 CLINICAL DATA:  Screening. EXAM: DIGITAL SCREENING BILATERAL MAMMOGRAM WITH TOMOSYNTHESIS AND CAD TECHNIQUE: Bilateral screening digital craniocaudal and mediolateral oblique mammograms were obtained. Bilateral screening digital breast tomosynthesis was performed. The images were evaluated with computer-aided detection. COMPARISON:  Previous exam(s). ACR Breast Density Category c: The breasts are heterogeneously dense, which may obscure small masses. FINDINGS: In the left breast, a possible asymmetry warrants further evaluation. In the right breast, no findings suspicious for malignancy. IMPRESSION: Further evaluation is suggested for possible asymmetry in the left breast. RECOMMENDATION: Diagnostic mammogram and possibly ultrasound of the left breast. (Code:[id number]) The patient will be contacted regarding the findings, and additional imaging will be scheduled. BI-RADS CATEGORY  0: Incomplete: Need additional imaging evaluation. Electronically Signed   By: Sherron Ales M.D.   On: 02/03/2023 14:02     ASSESSMENT:  72 y.o. Krista Butler, Krista Butler:  LEFT BREAST CANCER:  (1) status post left lumpectomy 11/23/2012 for ductal carcinoma in situ, grade 3, estrogen receptor 100% positive, progesterone receptor 0% positive, with -4  very close margins  (2) considered  NSABP B-43  but was found to be HER-2 negative ([id number])  (3) completed adjuvant radiation therapy on 02/08/2013.  (4) tamoxifen 20 mg daily started August 2014, discontinued August 2019  RIGHT BREAST CANCER: (5) status post right breast upper outer quadrant biopsy 11/13/2019 for a clinical T2 N0, stage IB invasive lobular carcinoma, E-cadherin negative, estrogen and progesterone receptor positive, with an MIB-1 of 20% and no HER-2 amplification  (6) genetics testing 12/03/2019 through the [facility] STAT + Multi-Cancer panels found no deleterious mutations in ATM, BRCA1, BRCA2, CDH1, CHEK2, PALB2, PTEN, STK11 and TP53. The Multi-Cancer Panel offered by [facility] includes sequencing and/or deletion duplication testing of the following 85 genes: AIP, ALK, APC, ATM, AXIN2,BAP1,  BARD1, BLM, BMPR1A, BRCA1, BRCA2, BRIP1, CASR, CDC73, CDH1, CDK4, CDKN1B, CDKN1C, CDKN2A (p14ARF), CDKN2A (p16INK4a), CEBPA, CHEK2, CTNNA1, DICER1, DIS3L2, EGFR (c.2369C>T, p.Thr790Met variant only), EPCAM (Deletion/duplication testing only), FH, FLCN, GATA2, GPC3, GREM1 (Promoter region deletion/duplication testing only), HOXB13 (c.251G>A, p.Gly84Glu), HRAS, KIT, MAX, MEN1, MET, MITF (c.952G>A, p.Glu318Lys variant only), MLH1, MSH2, MSH3, MSH6, MUTYH, NBN, NF1, NF2, NTHL1, PALB2, PDGFRA, PHOX2B, PMS2, POLD1, POLE, POT1, PRKAR1A, PTCH1, PTEN, RAD50, RAD51C, RAD51D, RB1, RECQL4, RET, RNF43, RUNX1, SDHAF2, SDHA (sequence changes only), SDHB, SDHC, SDHD, SMAD4, SMARCA4, SMARCB1, SMARCE1, STK11, SUFU, TERC, TERT, TMEM127, TP53, TSC1, TSC2, VHL, WRN and WT1.  (7) Oncotype testing on the 11/13/2019 biopsy showed a score of 22, predicting a recurrence outside the breast in the next 9 years of 8% if the patient only systemic therapy was tamoxifen or an aromatase inhibitor for 5 years.  It also predicted no benefit from chemotherapy.  (8) status post right lumpectomy and sentinel lymph node  sampling 12/26/2019 for a pT1c pN0, stage 1A invasive lobular carcinoma, with negative margins.  (a) a  total of 6 axillary lymph nodes were removed.  (9) adjuvant radiation6/28/21 - 02/14/20  Site/dose:   The patient initially received a dose of 42.56 Gy in 16 fractions to the breast using whole-breast tangent fields. This was delivered using a 3-D conformal technique. The patient then received a boost to the seroma. This delivered an additional 8 Gy in 37fractions using a 3 field photon technique due to the depth of the seroma. The total dose was 50.56 Gy.   (10) anastrozole started 03/10/2020  (a) osteoporosis: considering denosumab/Prolia   (b) referred to pelvic rehab 05/23/2020   PLAN:   She is tolerating tamoxifen very well. She is understandably worried about the mammogram results noticing left breast asymmetry There is no palpable mass on physical exam today.  She will proceed with diagnostic mammogram and ultrasound as scheduled and will plan for a biopsy if needed. We have discussed that it is very uncommon for the right-sided breast cancer to recur in the left breast.  She had some excellent questions about prognosis in general.  I have reassured her to the best extent I can.  I will keep her posted about the mammogram results.  She will continue tamoxifen in the interim.  She will return to clinic in 6 months or sooner as needed. I will call her this Friday to review the results from the mammogram and ultrasound.  *Total Encounter Time as defined by the Centers for Medicare and Medicaid Services includes, in addition to the face-to-face time of a patient visit (documented in the note above) non-face-to-face time: obtaining and reviewing outside history, ordering and reviewing medications, tests or procedures, care coordination (communications with other health care professionals or caregivers) and documentation in the medical record.

## 2023-02-08 NOTE — Telephone Encounter (Signed)
Received vm from pt stating abnormal MM with recommendations for diag MM/US. Diag MM/US scheduled for 7/18. Orders placed for bx to be added to appt. Dr. Al Pimple notified.  Msg sent to scheduling.

## 2023-02-09 ENCOUNTER — Telehealth: Payer: Self-pay | Admitting: Hematology and Oncology

## 2023-02-09 NOTE — Telephone Encounter (Signed)
 Spoke with patient confirming all upcoming appointments  

## 2023-02-10 ENCOUNTER — Ambulatory Visit: Payer: Medicare PPO

## 2023-02-10 ENCOUNTER — Ambulatory Visit
Admission: RE | Admit: 2023-02-10 | Discharge: 2023-02-10 | Disposition: A | Discharge status: Home / Self Care | Payer: Medicare PPO | Source: Ambulatory Visit | Attending: Hematology and Oncology | Admitting: Hematology and Oncology

## 2023-02-10 DIAGNOSIS — R928 Other abnormal and inconclusive findings on diagnostic imaging of breast: Secondary | ICD-10-CM

## 2023-02-11 ENCOUNTER — Inpatient Hospital Stay (HOSPITAL_BASED_OUTPATIENT_CLINIC_OR_DEPARTMENT_OTHER): Payer: Medicare PPO | Admitting: Hematology and Oncology

## 2023-02-11 DIAGNOSIS — Z170 Estrogen receptor positive status [ER+]: Secondary | ICD-10-CM

## 2023-02-11 DIAGNOSIS — C50212 Malignant neoplasm of upper-inner quadrant of left female breast: Secondary | ICD-10-CM

## 2023-02-11 NOTE — Progress Notes (Signed)
[facility]  Telephone:([phone]) [phone] [phone]) [phone]    D: Krista Butler  DOB: [birth year]  MR#: [id number]  ONG#:[id number]   Patient Care Team: Krista Prince, MD as PCP - General (Endocrinology) Krista Puffer, MD as Consulting Physician (Radiation Oncology) Krista Loron, MD as Consulting Physician (General Surgery) Krista Overlie, MD as Consulting Physician (Obstetrics and Gynecology) Krista Moulds, MD as Consulting Physician (Hematology and Oncology)  CHIEF COMPLAINT: Estrogen receptor positive right breast cancer; history of noninvasive left breast cancer.  CURRENT THERAPY: Tamoxifen,  INTERVAL HISTORY:  Krista Butler returns today for telephone visit. She is doing well on tamoxifen. We scheduled this to just go over her mammogram results since she was very worried about it.   COVID 19 VACCINATION STATUS: infection 02/2020 and May 2022, no vaccinations   RIGHT BREAST CANCER HISTORY: From the original intake note:   She underwent bilateral diagnostic mammography with tomography and right breast ultrasonography at [facility] on 11/08/2019 showing: breast density category D; suspicious 2.5 cm right breast mass at 10 o'clock, muscle involvement cannot be excluded; no evidence of left breast malignancy or lymphadenopathy.  She proceeded to biopsy on 11/13/2019 of the right breast area in question. Pathology from the procedure ([id number]) revealed: invasive mammary carcinoma, grade 2, e-cadherin negative. Prognostic indicators significant for: estrogen receptor 95% positive, progesterone receptor 95% positive, both with a strong staining intensity. Proliferation marker Ki67 of 20%. Her2 negative by immunohistochemistry (1+).   History of left breast cancer: From Krista Butler earlier note:  #1Patient noted some thickening of the right breast. She did not have any symptoms in the left breast. The thickening did not show any discrete mass or distortion on the  right. However the patient was found to have numerous bilateral calcifications. Within the left breast there was a new linear/branching calcifications measuring 12 mm. This was suspicious for malignancy. There are also 1.1 cm suspicious calcifications in a heterogeneous group within the upper inner quadrant of the right breast. She underwent stereotactic biopsy of both of these areas  #2The biopsy on the right was negative and only noted to be a fibroadenoma with associated coarse calcifications. 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  #3 patient had a MRI performed that showed a clot area of enhancement in the retroareolar region of the left breast measuring 1 cm in maximum does mention corresponding to the known DCIS.  #4 patient is now status post left-sided lumpectomy on 11/23/2012. Her pathology revealed DCIS grade 3. Margins were negative with the closest margin being 1 mm from the inferior posterior margins. Tumor was ER ER positive PR positive.  #5 status post completion of radiation therapy to the left breast on 02/08/2013. #6 patient will proceed with after her radiation with chemoprevention with tamoxifen 20 mg daily. But this will begin after she completes radiation therapy.  #7 NSABP B. 43 clinical study enrollment. He did meet with the nurse and her tissue will be sent for HER-2 testing. HER-2 negative and therefore did not enroll on B. 43 trial  #8 patient began adjuvant curative intent tamoxifen 20 mg daily starting 02/26/2013. Total of 5 years of therapy is planned.   MEDICAL HISTORY: Past Medical History:  Diagnosis Date   Allergy    medicated for this   Arm fracture    LEFT arm; as a child   Breast cancer (HCC)    left   Diverticulosis  Family history of breast cancer    Family history of kidney cancer    GERD (gastroesophageal reflux disease)    History of radiation therapy  12/25/12-02/08/13   64.4 Gy to left breast   Osteoporosis    Personal history of radiation therapy    Thyroid disease    Wears glasses     SURGICAL HISTORY:  Past Surgical History:  Procedure Laterality Date   BREAST BIOPSY  1997   lt breast cyst   BREAST LUMPECTOMY Left 2014   BREAST LUMPECTOMY Right 2021   BREAST LUMPECTOMY WITH NEEDLE LOCALIZATION Left 11/23/2012   Procedure: BREAST LUMPECTOMY WITH NEEDLE LOCALIZATION;  Surgeon: Krista Loron, MD;  Location: [facility];  Service: General;  Laterality: Left;   BREAST LUMPECTOMY WITH RADIOACTIVE SEED AND SENTINEL LYMPH NODE BIOPSY Right 12/26/2019   Procedure: RIGHT BREAST LUMPECTOMY WITH RADIOACTIVE SEED AND RIGHT AXILLARY SENTINEL LYMPH NODE BIOPSY;  Surgeon: Krista Loron, MD;  Location: [facility];  Service: General;  Laterality: Right;   COLONOSCOPY     TONSILLECTOMY     UPPER GI ENDOSCOPY  2012    FAMILY HISTORY: Family History  Problem Relation Age of Onset   Heart disease Mother    Hypertension Mother    Stroke Father    Heart disease Father    Diabetes Father    Depression Sister    Hyperlipidemia Sister    Stroke Maternal Grandmother    Stroke Maternal Grandfather    Heart Problems Paternal Grandfather    Breast cancer Cousin        dx. younger than 92 (paternal cousin)   Cancer Cousin        dx. in her 100s, unknown type (paternal cousin)   Kidney cancer Cousin 51       (maternal cousin)   Other Cousin        genetic testing - unknown results (maternal cousin)   Colon cancer Neg Hx    Esophageal cancer Neg Hx    Pancreatic cancer Neg Hx    Stomach cancer Neg Hx    Liver disease Neg Hx   The patient's father died at the age of 108 following a stroke in the setting of severe heart disease. The patient's mother died also from heart disease at the age of 47. The patient had no brothers, one sister. There is no history of breast or ovarian cancer in the family.   GYN  HISTORY: Menarche age 39, first live birth age 63. The patient is GX P2. She went through menopause in her early 4s. She took hormone replacement briefly (approximately 2 years).   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 Her husband of 40+ years, Freida Busman, used to work for [employer] but is now a Surveyor, minerals. Daughter Laurena Bering is a homemaker in [place]. Son Annia Belt is a tool and dye Designer, television/film set. The patient has 5 grandchildren. She has one great grandchild. She attends the home church in [place].   ADVANCED DIRECTIVES: In the absence of any documentation to the contrary, the patient's spouse is their HCPOA.    HEALTH MAINTENANCE: Social History   Tobacco Use   Smoking status: Never   Smokeless tobacco: Never  Vaping Use   Vaping status: Never Used  Substance Use Topics   Alcohol use: Yes    Alcohol/week: 2.0 standard drinks of alcohol    Types: 2 Glasses of wine per week    Comment: occ  Drug use: No     Colonoscopy  Pap smear  DEXA scan  Lipid panel  Current Outpatient Medications  Medication Sig Dispense Refill   AMBULATORY NON FORMULARY MEDICATION CT-Mineral tablets 20 drops by mouth twice a day     AMBULATORY NON FORMULARY MEDICATION FungDX 1 capsul by mouth twice a day     AMBULATORY NON FORMULARY MEDICATION Bactrex 1 capsule by mouth twice a day     anastrozole (ARIMIDEX) 1 MG tablet Take 1 tablet (1 mg total) by mouth daily. 90 tablet 3   Ascorbic Acid (VITAMIN C PO) Take 1 tablet by mouth daily.     Biotin 1000 MCG tablet Take 1,000 mcg by mouth daily.     Cholecalciferol (VITAMIN D3 PO) Take 2 tablets by mouth daily.     Cyanocobalamin (B-12) 1000 MCG CAPS Take 1 tablet by mouth daily.     L-glutamine (ENDARI) 5 g PACK Powder Packet Take by mouth daily.     levothyroxine (SYNTHROID, LEVOTHROID) 175 MCG tablet Take 175 mcg by mouth daily before breakfast.     loratadine (CLARITIN) 10 MG tablet Take 10 mg by  mouth daily.     MAGNESIUM PO Take 1 Dose by mouth at bedtime.     Multiple Minerals-Vitamins (BONE ESSENTIALS PO) Take 1 tablet by mouth daily.     Multiple Vitamins-Minerals (PHYTOMULTI PO) Take 3 tablets by mouth daily.     pantoprazole (PROTONIX) 40 MG tablet Take 1 tablet (40 mg total) by mouth 2 (two) times daily. 180 tablet 3   Probiotic Product (PROBIOTIC DAILY) CAPS Take 1 capsule by mouth daily.     Pseudoephedrine-guaiFENesin (MUCINEX D PO) Take 1 capsule by mouth daily.     SALINE NA Place into the nose as needed.     tamoxifen (NOLVADEX) 20 MG tablet TAKE 1 TABLET BY MOUTH DAILY. 60 tablet 0   zinc gluconate 50 MG tablet Take 50 mg by mouth daily. 30 mg     No current facility-administered medications for this visit.    PHYSICAL EXAMINATION:  white woman who appears stated age  There were no vitals taken for this visit. There is no height or weight on file to calculate BMI.   ECOG PERFORMANCE STATUS: 1 - Symptomatic but completely ambulatory PE, telephone visit.  LABORATORY DATA: Lab Results  Component Value Date   WBC 4.7 02/08/2023   HGB 13.6 02/08/2023   HCT 41.1 02/08/2023   MCV 91.5 02/08/2023   PLT 240 02/08/2023      Chemistry      Component Value Date/Time   NA 140 02/08/2023 0824   NA 142 06/13/2013 1317   K 4.2 02/08/2023 0824   K 4.0 06/13/2013 1317   CL 107 02/08/2023 0824   CL 102 11/15/2012 1224   CO2 27 02/08/2023 0824   CO2 27 06/13/2013 1317   BUN 16 02/08/2023 0824   BUN 13.0 06/13/2013 1317   CREATININE 0.65 02/08/2023 0824   CREATININE 0.7 06/13/2013 1317      Component Value Date/Time   CALCIUM 9.4 02/08/2023 0824   CALCIUM 10.1 06/13/2013 1317   ALKPHOS 98 02/08/2023 0824   ALKPHOS 81 06/13/2013 1317   AST 18 02/08/2023 0824   AST 22 06/13/2013 1317   ALT 13 02/08/2023 0824   ALT 16 06/13/2013 1317   BILITOT 0.4 02/08/2023 0824   BILITOT 0.39 06/13/2013 1317      RESULTS: MM 3D DIAGNOSTIC MAMMOGRAM UNILATERAL LEFT  BREAST  Result Date: 02/10/2023  CLINICAL DATA:  Screening recall for possible left breast asymmetry. History of left lumpectomy 2014, right lumpectomy 2021. EXAM: DIGITAL DIAGNOSTIC UNILATERAL LEFT MAMMOGRAM WITH TOMOSYNTHESIS AND CAD TECHNIQUE: Left digital diagnostic mammography and breast tomosynthesis was performed. The images were evaluated with computer-aided detection. COMPARISON:  Previous exam(s). ACR Breast Density Category d: The breasts are extremely dense, which lowers the sensitivity of mammography. FINDINGS: Additional tomograms were performed of the left breast. The initially questioned possible left breast asymmetry resolves on the additional imaging with findings compatible with an area of overlapping fibroglandular tissue. There is no mammographic evidence of malignancy in the left breast. IMPRESSION: No mammographic evidence of malignancy in the left breast. RECOMMENDATION: Screening mammogram in one year.(Code:SM-B-01Y) I have discussed the findings and recommendations with the patient. If applicable, a reminder letter will be sent to the patient regarding the next appointment. BI-RADS CATEGORY  1: Negative. Electronically Signed   By: Edwin Cap M.D.   On: 02/10/2023 10:24   MM 3D SCREENING MAMMOGRAM BILATERAL BREAST  Result Date: 02/03/2023 CLINICAL DATA:  Screening. EXAM: DIGITAL SCREENING BILATERAL MAMMOGRAM WITH TOMOSYNTHESIS AND CAD TECHNIQUE: Bilateral screening digital craniocaudal and mediolateral oblique mammograms were obtained. Bilateral screening digital breast tomosynthesis was performed. The images were evaluated with computer-aided detection. COMPARISON:  Previous exam(s). ACR Breast Density Category c: The breasts are heterogeneously dense, which may obscure small masses. FINDINGS: In the left breast, a possible asymmetry warrants further evaluation. In the right breast, no findings suspicious for malignancy. IMPRESSION: Further evaluation is suggested for possible  asymmetry in the left breast. RECOMMENDATION: Diagnostic mammogram and possibly ultrasound of the left breast. (Code:[id number]) The patient will be contacted regarding the findings, and additional imaging will be scheduled. BI-RADS CATEGORY  0: Incomplete: Need additional imaging evaluation. Electronically Signed   By: Sherron Ales M.D.   On: 02/03/2023 14:02     ASSESSMENT:  72 y.o. Krista Butler, [place] woman:  LEFT BREAST CANCER:  (1) status post left lumpectomy 11/23/2012 for ductal carcinoma in situ, grade 3, estrogen receptor 100% positive, progesterone receptor 0% positive, with -4 very close margins  (2) considered  NSABP B-43  but was found to be HER-2 negative ([id number])  (3) completed adjuvant radiation therapy on 02/08/2013.  (4) tamoxifen 20 mg daily started August 2014, discontinued August 2019  RIGHT BREAST CANCER: (5) status post right breast upper outer quadrant biopsy 11/13/2019 for a clinical T2 N0, stage IB invasive lobular carcinoma, E-cadherin negative, estrogen and progesterone receptor positive, with an MIB-1 of 20% and no HER-2 amplification  (6) genetics testing 12/03/2019 through the [facility] STAT + Multi-Cancer panels found no deleterious mutations in ATM, BRCA1, BRCA2, CDH1, CHEK2, PALB2, PTEN, STK11 and TP53. The Multi-Cancer Panel offered by [facility] includes sequencing and/or deletion duplication testing of the following 85 genes: AIP, ALK, APC, ATM, AXIN2,BAP1,  BARD1, BLM, BMPR1A, BRCA1, BRCA2, BRIP1, CASR, CDC73, CDH1, CDK4, CDKN1B, CDKN1C, CDKN2A (p14ARF), CDKN2A (p16INK4a), CEBPA, CHEK2, CTNNA1, DICER1, DIS3L2, EGFR (c.2369C>T, p.Thr790Met variant only), EPCAM (Deletion/duplication testing only), FH, FLCN, GATA2, GPC3, GREM1 (Promoter region deletion/duplication testing only), HOXB13 (c.251G>A, p.Gly84Glu), HRAS, KIT, MAX, MEN1, MET, MITF (c.952G>A, p.Glu318Lys variant only), MLH1, MSH2, MSH3, MSH6, MUTYH, NBN, NF1, NF2, NTHL1, PALB2, PDGFRA, PHOX2B, PMS2,  POLD1, POLE, POT1, PRKAR1A, PTCH1, PTEN, RAD50, RAD51C, RAD51D, RB1, RECQL4, RET, RNF43, RUNX1, SDHAF2, SDHA (sequence changes only), SDHB, SDHC, SDHD, SMAD4, SMARCA4, SMARCB1, SMARCE1, STK11, SUFU, TERC, TERT, TMEM127, TP53, TSC1, TSC2, VHL, WRN and WT1.  (7) Oncotype testing on the 11/13/2019 biopsy showed  a score of 22, predicting a recurrence outside the breast in the next 9 years of 8% if the patient only systemic therapy was tamoxifen or an aromatase inhibitor for 5 years.  It also predicted no benefit from chemotherapy.  (8) status post right lumpectomy and sentinel lymph node sampling 12/26/2019 for a pT1c pN0, stage 1A invasive lobular carcinoma, with negative margins.  (a) a total of 6 axillary lymph nodes were removed.  (9) adjuvant radiation6/28/21 - 02/14/20  Site/dose:   The patient initially received a dose of 42.56 Gy in 16 fractions to the breast using whole-breast tangent fields. This was delivered using a 3-D conformal technique. The patient then received a boost to the seroma. This delivered an additional 8 Gy in 56fractions using a 3 field photon technique due to the depth of the seroma. The total dose was 50.56 Gy.   (10) anastrozole started 03/10/2020  (a) osteoporosis: considering denosumab/Prolia   (b) referred to pelvic rehab 05/23/2020   PLAN:   She is tolerating tamoxifen very well. We reviewed mammogram results which were without any concerns for malignancy. She is very thankful for this info. We will see her back in 6 months.  I connected with  STEPAHNIE CAMPO on 02/11/23 by a telephone application and verified that I am speaking with the correct person using two identifiers.   I discussed the limitations of evaluation and management by telemedicine. The patient expressed understanding and agreed to proceed.  Total time spent: 5 min *Total Encounter Time as defined by the Centers for Medicare and Medicaid Services includes, in addition to the face-to-face time of  a patient visit (documented in the note above) non-face-to-face time: obtaining and reviewing outside history, ordering and reviewing medications, tests or procedures, care coordination (communications with other health care professionals or caregivers) and documentation in the medical record.

## 2023-02-21 ENCOUNTER — Ambulatory Visit (INDEPENDENT_AMBULATORY_CARE_PROVIDER_SITE_OTHER): Payer: Medicare PPO | Admitting: Podiatry

## 2023-02-21 ENCOUNTER — Encounter: Payer: Self-pay | Admitting: Podiatry

## 2023-02-21 ENCOUNTER — Ambulatory Visit (INDEPENDENT_AMBULATORY_CARE_PROVIDER_SITE_OTHER): Payer: Medicare PPO

## 2023-02-21 DIAGNOSIS — M778 Other enthesopathies, not elsewhere classified: Secondary | ICD-10-CM

## 2023-02-21 DIAGNOSIS — M216X1 Other acquired deformities of right foot: Secondary | ICD-10-CM

## 2023-02-21 NOTE — Progress Notes (Signed)
Subjective:  Patient ID: Krista Butler, female    DOB: 22-Jul-1951,  MRN: [id number] HPI Chief Complaint  Patient presents with   Foot Pain    RIGHT - noticing heel turns in and foot turns out when walking, was causing knee pain, but haven't had that pain recently, also foot sits more to the outside of her shoes, did get some OTC insoles from her "functional doctor", knot lateral lower leg, some tenderness, but not too bad   New Patient (Initial Visit)    72 y.o. female presents with the above complaint.   ROS: Denies fever chills nausea vomit muscle aches pains calf pain back pain chest pain shortness of breath.  Past Medical History:  Diagnosis Date   Allergy    medicated for this   Arm fracture    LEFT arm; as a child   Breast cancer (HCC)    left   Diverticulosis    Family history of breast cancer    Family history of kidney cancer    GERD (gastroesophageal reflux disease)    History of radiation therapy 12/25/12-02/08/13   64.4 Gy to left breast   Osteoporosis    Personal history of radiation therapy    Thyroid disease    Wears glasses    Past Surgical History:  Procedure Laterality Date   BREAST BIOPSY  1997   lt breast cyst   BREAST LUMPECTOMY Left 2014   BREAST LUMPECTOMY Right 2021   BREAST LUMPECTOMY WITH NEEDLE LOCALIZATION Left 11/23/2012   Procedure: BREAST LUMPECTOMY WITH NEEDLE LOCALIZATION;  Surgeon: [name], MD;  Location: [facility];  Service: General;  Laterality: Left;   BREAST LUMPECTOMY WITH RADIOACTIVE SEED AND SENTINEL LYMPH NODE BIOPSY Right 12/26/2019   Procedure: RIGHT BREAST LUMPECTOMY WITH RADIOACTIVE SEED AND RIGHT AXILLARY SENTINEL LYMPH NODE BIOPSY;  Surgeon: [name], MD;  Location: [facility];  Service: General;  Laterality: Right;   COLONOSCOPY     TONSILLECTOMY     UPPER GI ENDOSCOPY  2012    Current Outpatient Medications:    montelukast (SINGULAIR) 10 MG tablet, , Disp: , Rfl:     AMBULATORY NON FORMULARY MEDICATION, FungDX 1 capsul by mouth twice a day, Disp: , Rfl:    AMBULATORY NON FORMULARY MEDICATION, Bactrex 1 capsule by mouth twice a day, Disp: , Rfl:    anastrozole (ARIMIDEX) 1 MG tablet, Take 1 tablet (1 mg total) by mouth daily., Disp: 90 tablet, Rfl: 3   Ascorbic Acid (VITAMIN C PO), Take 1 tablet by mouth daily., Disp: , Rfl:    Biotin 1000 MCG tablet, Take 1,000 mcg by mouth daily., Disp: , Rfl:    Cholecalciferol (VITAMIN D3 PO), Take 2 tablets by mouth daily., Disp: , Rfl:    levothyroxine (SYNTHROID, LEVOTHROID) 175 MCG tablet, Take 175 mcg by mouth daily before breakfast., Disp: , Rfl:    loratadine (CLARITIN) 10 MG tablet, Take 10 mg by mouth daily., Disp: , Rfl:    MAGNESIUM PO, Take 1 Dose by mouth at bedtime., Disp: , Rfl:    Multiple Minerals-Vitamins (BONE ESSENTIALS PO), Take 1 tablet by mouth daily., Disp: , Rfl:    Multiple Vitamins-Minerals (PHYTOMULTI PO), Take 3 tablets by mouth daily., Disp: , Rfl:    Probiotic Product (PROBIOTIC DAILY) CAPS, Take 1 capsule by mouth daily., Disp: , Rfl:    Pseudoephedrine-guaiFENesin (MUCINEX D PO), Take 1 capsule by mouth daily., Disp: , Rfl:    SALINE NA, Place into the nose  as needed., Disp: , Rfl:    tamoxifen (NOLVADEX) 20 MG tablet, TAKE 1 TABLET BY MOUTH DAILY., Disp: 60 tablet, Rfl: 0   zinc gluconate 50 MG tablet, Take 50 mg by mouth daily. 30 mg, Disp: , Rfl:   Allergies  Allergen Reactions   Sulfa Antibiotics Cough   Percocet [Oxycodone-Acetaminophen]     Itching and flushed face   Review of Systems Objective:  There were no vitals filed for this visit.  General: Well developed, nourished, in no acute distress, alert and oriented x3   Dermatological: Skin is warm, dry and supple bilateral. Nails x 10 are well maintained; remaining integument appears unremarkable at this time. There are no open sores, no preulcerative lesions, no rash or signs of infection present.  Vascular: Dorsalis  Pedis artery and Posterior Tibial artery pedal pulses are 2/4 bilateral with immedate capillary fill time. Pedal hair growth present. No varicosities and no lower extremity edema present bilateral.   Neruologic: Grossly intact via light touch bilateral. Vibratory intact via tuning fork bilateral. Protective threshold with Semmes Wienstein monofilament intact to all pedal sites bilateral. Patellar and Achilles deep tendon reflexes 2+ bilateral. No Babinski or clonus noted bilateral.   Musculoskeletal: No gross boney pedal deformities bilateral. No pain, crepitus, or limitation noted with foot and ankle range of motion bilateral. Muscular strength 5/5 in all groups tested bilateral.  Fat atrophy to the right leg and a small area just above her lateral malleolus most likely due to trauma.  She has abduction with internal rotation of her right leg causing eversion of her calcaneus and collapse of her midfoot.  This is not severe but enough to cause her symptomatology.  Gait: Unassisted, Nonantalgic.    Radiographs:  Radiographs taken today demonstrate an osseously mature individual with good bone mineralization.  She has a rectus foot type I do not see any significant osteoarthritic change mild hallux valgus deformity mild hammertoe deformities present.  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  Plan: Discussed appropriate shoe gear stretching exercise ice therapy sugar modifications discussed the need for orthotics.     Artie Mcintyre T. [place], [place]

## 2023-03-15 DIAGNOSIS — M545 Low back pain, unspecified: Secondary | ICD-10-CM | POA: Diagnosis not present

## 2023-03-15 DIAGNOSIS — M25552 Pain in left hip: Secondary | ICD-10-CM | POA: Diagnosis not present

## 2023-03-25 ENCOUNTER — Other Ambulatory Visit: Payer: Self-pay | Admitting: *Deleted

## 2023-03-25 MED ORDER — TAMOXIFEN CITRATE 20 MG PO TABS: 20.0000 mg | ORAL_TABLET | Freq: Every day | ORAL | 0 refills | Status: DC

## 2023-03-29 ENCOUNTER — Other Ambulatory Visit: Payer: Self-pay | Admitting: *Deleted

## 2023-04-12 DIAGNOSIS — X509XXA Other and unspecified overexertion or strenuous movements or postures, initial encounter: Secondary | ICD-10-CM | POA: Diagnosis not present

## 2023-04-12 DIAGNOSIS — S46812A Strain of other muscles, fascia and tendons at shoulder and upper arm level, left arm, initial encounter: Secondary | ICD-10-CM | POA: Diagnosis not present

## 2023-04-12 DIAGNOSIS — M542 Cervicalgia: Secondary | ICD-10-CM | POA: Diagnosis not present

## 2023-04-19 DIAGNOSIS — R7309 Other abnormal glucose: Secondary | ICD-10-CM | POA: Diagnosis not present

## 2023-04-19 DIAGNOSIS — Z1389 Encounter for screening for other disorder: Secondary | ICD-10-CM | POA: Diagnosis not present

## 2023-04-19 DIAGNOSIS — Z1212 Encounter for screening for malignant neoplasm of rectum: Secondary | ICD-10-CM | POA: Diagnosis not present

## 2023-04-19 DIAGNOSIS — M545 Low back pain, unspecified: Secondary | ICD-10-CM | POA: Diagnosis not present

## 2023-04-19 DIAGNOSIS — E039 Hypothyroidism, unspecified: Secondary | ICD-10-CM | POA: Diagnosis not present

## 2023-04-19 DIAGNOSIS — E042 Nontoxic multinodular goiter: Secondary | ICD-10-CM | POA: Diagnosis not present

## 2023-04-19 DIAGNOSIS — M47812 Spondylosis without myelopathy or radiculopathy, cervical region: Secondary | ICD-10-CM | POA: Diagnosis not present

## 2023-04-19 DIAGNOSIS — M810 Age-related osteoporosis without current pathological fracture: Secondary | ICD-10-CM | POA: Diagnosis not present

## 2023-04-20 DIAGNOSIS — Z0000 Encounter for general adult medical examination without abnormal findings: Secondary | ICD-10-CM | POA: Diagnosis not present

## 2023-04-20 DIAGNOSIS — Z1331 Encounter for screening for depression: Secondary | ICD-10-CM | POA: Diagnosis not present

## 2023-04-20 DIAGNOSIS — K219 Gastro-esophageal reflux disease without esophagitis: Secondary | ICD-10-CM | POA: Diagnosis not present

## 2023-04-20 DIAGNOSIS — Z1339 Encounter for screening examination for other mental health and behavioral disorders: Secondary | ICD-10-CM | POA: Diagnosis not present

## 2023-04-20 DIAGNOSIS — E039 Hypothyroidism, unspecified: Secondary | ICD-10-CM | POA: Diagnosis not present

## 2023-04-20 DIAGNOSIS — C50212 Malignant neoplasm of upper-inner quadrant of left female breast: Secondary | ICD-10-CM | POA: Diagnosis not present

## 2023-04-20 DIAGNOSIS — R82998 Other abnormal findings in urine: Secondary | ICD-10-CM | POA: Diagnosis not present

## 2023-04-20 DIAGNOSIS — E785 Hyperlipidemia, unspecified: Secondary | ICD-10-CM | POA: Diagnosis not present

## 2023-04-20 DIAGNOSIS — M810 Age-related osteoporosis without current pathological fracture: Secondary | ICD-10-CM | POA: Diagnosis not present

## 2023-04-25 DIAGNOSIS — M25552 Pain in left hip: Secondary | ICD-10-CM | POA: Diagnosis not present

## 2023-04-25 DIAGNOSIS — M545 Low back pain, unspecified: Secondary | ICD-10-CM | POA: Diagnosis not present

## 2023-04-25 DIAGNOSIS — G5702 Lesion of sciatic nerve, left lower limb: Secondary | ICD-10-CM | POA: Diagnosis not present

## 2023-04-25 DIAGNOSIS — M79605 Pain in left leg: Secondary | ICD-10-CM | POA: Diagnosis not present

## 2023-05-12 DIAGNOSIS — G5702 Lesion of sciatic nerve, left lower limb: Secondary | ICD-10-CM | POA: Diagnosis not present

## 2023-05-12 DIAGNOSIS — M545 Low back pain, unspecified: Secondary | ICD-10-CM | POA: Diagnosis not present

## 2023-05-12 DIAGNOSIS — M79605 Pain in left leg: Secondary | ICD-10-CM | POA: Diagnosis not present

## 2023-05-12 DIAGNOSIS — M25552 Pain in left hip: Secondary | ICD-10-CM | POA: Diagnosis not present

## 2023-05-24 DIAGNOSIS — M545 Low back pain, unspecified: Secondary | ICD-10-CM | POA: Diagnosis not present

## 2023-05-24 DIAGNOSIS — G5702 Lesion of sciatic nerve, left lower limb: Secondary | ICD-10-CM | POA: Diagnosis not present

## 2023-05-24 DIAGNOSIS — M79605 Pain in left leg: Secondary | ICD-10-CM | POA: Diagnosis not present

## 2023-05-24 DIAGNOSIS — M25552 Pain in left hip: Secondary | ICD-10-CM | POA: Diagnosis not present

## 2023-05-31 DIAGNOSIS — M79605 Pain in left leg: Secondary | ICD-10-CM | POA: Diagnosis not present

## 2023-05-31 DIAGNOSIS — M545 Low back pain, unspecified: Secondary | ICD-10-CM | POA: Diagnosis not present

## 2023-05-31 DIAGNOSIS — M25552 Pain in left hip: Secondary | ICD-10-CM | POA: Diagnosis not present

## 2023-05-31 DIAGNOSIS — G5702 Lesion of sciatic nerve, left lower limb: Secondary | ICD-10-CM | POA: Diagnosis not present

## 2023-06-01 DIAGNOSIS — H35363 Drusen (degenerative) of macula, bilateral: Secondary | ICD-10-CM | POA: Diagnosis not present

## 2023-06-25 DIAGNOSIS — M79605 Pain in left leg: Secondary | ICD-10-CM | POA: Diagnosis not present

## 2023-06-25 DIAGNOSIS — M25552 Pain in left hip: Secondary | ICD-10-CM | POA: Diagnosis not present

## 2023-06-25 DIAGNOSIS — G5702 Lesion of sciatic nerve, left lower limb: Secondary | ICD-10-CM | POA: Diagnosis not present

## 2023-06-25 DIAGNOSIS — M545 Low back pain, unspecified: Secondary | ICD-10-CM | POA: Diagnosis not present

## 2023-06-30 ENCOUNTER — Other Ambulatory Visit: Payer: Self-pay | Admitting: Hematology and Oncology

## 2023-07-12 ENCOUNTER — Telehealth: Payer: Self-pay | Admitting: Hematology and Oncology

## 2023-07-12 DIAGNOSIS — M79605 Pain in left leg: Secondary | ICD-10-CM | POA: Diagnosis not present

## 2023-07-12 DIAGNOSIS — G5702 Lesion of sciatic nerve, left lower limb: Secondary | ICD-10-CM | POA: Diagnosis not present

## 2023-07-12 DIAGNOSIS — M545 Low back pain, unspecified: Secondary | ICD-10-CM | POA: Diagnosis not present

## 2023-07-12 DIAGNOSIS — M25552 Pain in left hip: Secondary | ICD-10-CM | POA: Diagnosis not present

## 2023-07-12 NOTE — Telephone Encounter (Signed)
Spoke with patient confirming upcoming appointment  

## 2023-07-14 ENCOUNTER — Telehealth: Payer: Self-pay | Admitting: *Deleted

## 2023-07-14 NOTE — Telephone Encounter (Signed)
This RN spoke with pt per her call stating onset for several weeks of pain and tightness under her right breast and goes to her axilla.  Pt scheduled for [facility] per discussion with provider.

## 2023-07-14 NOTE — Progress Notes (Signed)
Symptom Management Consult Note [facility]    Patient Care Team: Adrian Prince, MD as PCP - General (Endocrinology) Dorothy Puffer, MD as Consulting Physician (Radiation Oncology) [name], MD as Consulting Physician (General Surgery) Richarda Overlie, MD as Consulting Physician (Obstetrics and Gynecology) Rachel Moulds, MD as Consulting Physician (Hematology and Oncology)    Name / MRN / DOB: [name]  [id number]  1951-03-19   Date of visit: 07/15/2023   Chief Complaint/Reason for visit: Right arm pain   Current Therapy: Tamoxifen   ASSESSMENT & PLAN: Patient is a 72 y.o. female with oncologic history of estrogen receptor positive right breast cancer; history of noninvasive left breast cancer followed by Dr. Al Pimple.  I have viewed most recent oncology note and lab work.    #Estrogen receptor positive right breast cancer; history of noninvasive left breast cancer  - History: right breast cancer with lumpectomy with 6 nodes removed on 12/26/2019 followed by radiation completed 02/14/2020. Left breast cancer in 2014 with lumpectomy with no nodes removed and radiation  -Last mammogram in 01/2023 was negative for malignancy - Next appointment with oncologist is 08/16/23  #Arm pain -Physical exam is overall unremarkable. -Will send referral to lymphedema clinic for further evaluation. -Discussed with patient if symptoms do not improve she should RTC for further workup.  She is agreeable with this plan.   Strict ED precautions discussed should symptoms acutely worsen.   Heme/Onc History: Oncology History  Malignant neoplasm of upper-inner quadrant of left breast in female, estrogen receptor positive (HCC)  11/09/2012 Initial Diagnosis   Malignant neoplasm of upper-inner quadrant of left breast in female, estrogen receptor positive (HCC)   12/03/2019 Genetic Testing   Negative genetic testing:  No pathogenic variants detected on the [facility] Breast  Cancer STAT + Multi-Cancer panels. The report date is 12/03/2019.  The Breast Cancer STAT Panel offered by [facility] includes sequencing and deletion/duplication analysis for the following 9 genes:  ATM, BRCA1, BRCA2, CDH1, CHEK2, PALB2, PTEN, STK11 and TP53. The Multi-Cancer Panel offered by [facility] includes sequencing and/or deletion duplication testing of the following 85 genes: AIP, ALK, APC, ATM, AXIN2,BAP1,  BARD1, BLM, BMPR1A, BRCA1, BRCA2, BRIP1, CASR, CDC73, CDH1, CDK4, CDKN1B, CDKN1C, CDKN2A (p14ARF), CDKN2A (p16INK4a), CEBPA, CHEK2, CTNNA1, DICER1, DIS3L2, EGFR (c.2369C>T, p.Thr790Met variant only), EPCAM (Deletion/duplication testing only), FH, FLCN, GATA2, GPC3, GREM1 (Promoter region deletion/duplication testing only), HOXB13 (c.251G>A, p.Gly84Glu), HRAS, KIT, MAX, MEN1, MET, MITF (c.952G>A, p.Glu318Lys variant only), MLH1, MSH2, MSH3, MSH6, MUTYH, NBN, NF1, NF2, NTHL1, PALB2, PDGFRA, PHOX2B, PMS2, POLD1, POLE, POT1, PRKAR1A, PTCH1, PTEN, RAD50, RAD51C, RAD51D, RB1, RECQL4, RET, RNF43, RUNX1, SDHAF2, SDHA (sequence changes only), SDHB, SDHC, SDHD, SMAD4, SMARCA4, SMARCB1, SMARCE1, STK11, SUFU, TERC, TERT, TMEM127, TP53, TSC1, TSC2, VHL, WRN and WT1.   Malignant neoplasm of upper-outer quadrant of right breast in female, estrogen receptor positive (HCC)  11/13/2019 Cancer Staging   Staging form: Breast, AJCC 8th Edition - Clinical stage from 11/13/2019: Stage IB (cT2, cN0, cM0, G2, ER+, PR+, HER2-) - Signed by Loa Socks, NP on 01/09/2020   11/21/2019 Initial Diagnosis   Malignant neoplasm of upper-outer quadrant of right breast in female, estrogen receptor positive (HCC)   12/26/2019 Cancer Staging   Staging form: Breast, AJCC 8th Edition - Pathologic stage from 12/26/2019: Stage IA (pT2, pN0, cM0, G2, ER+, PR+, HER2-) - Signed by Loa Socks, NP on 01/09/2020       Interval history-: Discussed the use of AI scribe software for clinical note  transcription with the  patient, who gave verbal consent to proceed.   Krista Butler is a 72 y.o. female with oncologic history as above presenting to [facility] today with chief complaint of right arm pain. Patient is accompanied by spouse who provides additional history.   Patient reports aching pain in the right arm. The pain began approximately four weeks ago, during a course of physical therapy for neck and lower back pain. The patient describes the pain as aching, radiating from the underarm to the lower arm and up to the neck. The pain is intermittent, but has been more persistent recently. The patient rates the pain as a 3 on a scale of 1 to 10.  The physical therapy, which included range of motion exercises, was initiated due to sharp pain in the neck and lower back. The patient completed six sessions of therapy, initially weekly and then biweekly, with the last session occurring this week. The therapy was successful in alleviating the initial sharp neck and back pain.  The patient has been applying heat to the painful area, which provides some relief. Over-the-counter pain medications, such as ibuprofen and Tylenol, have been taken for general aches and pains, but the patient has not noticed a significant difference in the current pain. The patient denies any swelling in the arm, shortness of breath, chest pain, or changes in the breast.  The patient has a history of lymphedema on the right side, for which she previously attended a lymphedema clinic in 2021.    ROS  All other systems are reviewed and are negative for acute change except as noted in the HPI.    Allergies  Allergen Reactions   Sulfa Antibiotics Cough   Percocet [Oxycodone-Acetaminophen]     Itching and flushed face     Past Medical History:  Diagnosis Date   Allergy    medicated for this   Arm fracture    LEFT arm; as a child   Breast cancer (HCC)    left   Diverticulosis    Family history of breast cancer    Family history of kidney  cancer    GERD (gastroesophageal reflux disease)    History of radiation therapy 12/25/12-02/08/13   64.4 Gy to left breast   Osteoporosis    Personal history of radiation therapy    Thyroid disease    Wears glasses      Past Surgical History:  Procedure Laterality Date   BREAST BIOPSY  1997   lt breast cyst   BREAST LUMPECTOMY Left 2014   BREAST LUMPECTOMY Right 2021   BREAST LUMPECTOMY WITH NEEDLE LOCALIZATION Left 11/23/2012   Procedure: BREAST LUMPECTOMY WITH NEEDLE LOCALIZATION;  Surgeon: [name], MD;  Location: [facility];  Service: General;  Laterality: Left;   BREAST LUMPECTOMY WITH RADIOACTIVE SEED AND SENTINEL LYMPH NODE BIOPSY Right 12/26/2019   Procedure: RIGHT BREAST LUMPECTOMY WITH RADIOACTIVE SEED AND RIGHT AXILLARY SENTINEL LYMPH NODE BIOPSY;  Surgeon: [name], MD;  Location: [facility];  Service: General;  Laterality: Right;   COLONOSCOPY     TONSILLECTOMY     UPPER GI ENDOSCOPY  2012    Social History   Socioeconomic History   Marital status: Married    Spouse name: Not on file   Number of children: 2   Years of education: Not on file   Highest education level: Not on file  Occupational History   Occupation: Homemaker  Tobacco Use   Smoking status: Never  Smokeless tobacco: Never  Vaping Use   Vaping status: Never Used  Substance and Sexual Activity   Alcohol use: Yes    Alcohol/week: 2.0 standard drinks of alcohol    Types: 2 Glasses of wine per week    Comment: occ   Drug use: No   Sexual activity: Not on file  Other Topics Concern   Not on file  Social History Narrative   Not on file   Social Drivers of Health   Financial Resource Strain: Not on file  Food Insecurity: Not on file  Transportation Needs: Not on file  Physical Activity: Not on file  Stress: Not on file  Social Connections: Not on file  Intimate Partner Violence: Not on file    Family History  Problem Relation Age of  Onset   Heart disease Mother    Hypertension Mother    Stroke Father    Heart disease Father    Diabetes Father    Depression Sister    Hyperlipidemia Sister    Stroke Maternal Grandmother    Stroke Maternal Grandfather    Heart Problems Paternal Grandfather    Breast cancer Cousin        dx. younger than 18 (paternal cousin)   Cancer Cousin        dx. in her 79s, unknown type (paternal cousin)   Kidney cancer Cousin 59       (maternal cousin)   Other Cousin        genetic testing - unknown results (maternal cousin)   Colon cancer Neg Hx    Esophageal cancer Neg Hx    Pancreatic cancer Neg Hx    Stomach cancer Neg Hx    Liver disease Neg Hx      Current Outpatient Medications:    Ascorbic Acid (VITAMIN C PO), Take 1 tablet by mouth daily., Disp: , Rfl:    Biotin 1000 MCG tablet, Take 1,000 mcg by mouth daily., Disp: , Rfl:    Cholecalciferol (VITAMIN D3 PO), Take 2 tablets by mouth daily., Disp: , Rfl:    levothyroxine (SYNTHROID, LEVOTHROID) 175 MCG tablet, Take 175 mcg by mouth daily before breakfast., Disp: , Rfl:    loratadine (CLARITIN) 10 MG tablet, Take 10 mg by mouth daily., Disp: , Rfl:    Multiple Minerals-Vitamins (BONE ESSENTIALS PO), Take 1 tablet by mouth daily., Disp: , Rfl:    Multiple Vitamins-Minerals (PHYTOMULTI PO), Take 3 tablets by mouth daily., Disp: , Rfl:    tamoxifen (NOLVADEX) 20 MG tablet, TAKE 1 TABLET BY MOUTH EVERY DAY, Disp: 90 tablet, Rfl: 0   zinc gluconate 50 MG tablet, Take 50 mg by mouth daily. 30 mg, Disp: , Rfl:    AMBULATORY NON FORMULARY MEDICATION, FungDX 1 capsul by mouth twice a day, Disp: , Rfl:    AMBULATORY NON FORMULARY MEDICATION, Bactrex 1 capsule by mouth twice a day, Disp: , Rfl:    anastrozole (ARIMIDEX) 1 MG tablet, Take 1 tablet (1 mg total) by mouth daily. (Patient not taking: Reported on 07/15/2023), Disp: 90 tablet, Rfl: 3   MAGNESIUM PO, Take 1 Dose by mouth at bedtime. (Patient not taking: Reported on 07/15/2023),  Disp: , Rfl:    montelukast (SINGULAIR) 10 MG tablet, , Disp: , Rfl:    Pseudoephedrine-guaiFENesin (MUCINEX D PO), Take 1 capsule by mouth daily. (Patient not taking: Reported on 07/15/2023), Disp: , Rfl:    SALINE NA, Place into the nose as needed., Disp: , Rfl:   PHYSICAL EXAM: ECOG  FS:1 - Symptomatic but completely ambulatory    Vitals:   07/15/23 1335  BP: 98/67  Pulse: 77  Resp: 15  Temp: (!) 97.2 F (36.2 C)  TempSrc: Temporal  SpO2: 97%  Weight: 146 lb 12.8 oz (66.6 kg)  Height: 5\' 6"  (1.676 m)   Physical Exam Vitals and nursing note reviewed. Exam conducted with a chaperone present.  Constitutional:      Appearance: She is not ill-appearing or toxic-appearing.  HENT:     Head: Normocephalic.  Eyes:     Conjunctiva/sclera: Conjunctivae normal.  Cardiovascular:     Rate and Rhythm: Normal rate.     Pulses:          Radial pulses are 2+ on the right side.  Pulmonary:     Effort: Pulmonary effort is normal.  Chest:  Breasts:    Right: No tenderness.     Comments: Right breast without palpable masses. No overlying skin changes. Abdominal:     General: There is no distension.  Musculoskeletal:     Cervical back: Normal range of motion.     Comments: Full ROM of RUE. Soft compartments in RUE. No edema.  Skin:    General: Skin is warm and dry.  Neurological:     Mental Status: She is alert.        LABORATORY DATA: I have reviewed the data as listed    Latest Ref Rng & Units 02/08/2023    8:24 AM 01/14/2022   11:19 AM 04/24/2021    2:00 PM  CBC  WBC 4.0 - 10.5 K/uL 4.7  5.9  5.0   Hemoglobin 12.0 - 15.0 g/dL 19.1  47.8  29.5   Hematocrit 36.0 - 46.0 % 41.1  41.7  42.0   Platelets 150 - 400 K/uL 240  225  237         Latest Ref Rng & Units 02/08/2023    8:24 AM 01/14/2022   11:19 AM 04/24/2021    2:00 PM  CMP  Glucose 70 - 99 mg/dL 86  95  90   BUN 8 - 23 mg/dL 16  19  13    Creatinine 0.44 - 1.00 mg/dL 6.21  3.08  6.57   Sodium 135 - 145 mmol/L  140  136  136   Potassium 3.5 - 5.1 mmol/L 4.2  3.7  4.2   Chloride 98 - 111 mmol/L 107  100  103   CO2 22 - 32 mmol/L 27  30  27    Calcium 8.9 - 10.3 mg/dL 9.4  9.6  9.3   Total Protein 6.5 - 8.1 g/dL 7.1  7.7  7.1   Total Bilirubin 0.3 - 1.2 mg/dL 0.4  0.5  0.7   Alkaline Phos 38 - 126 U/L 98  101  92   AST 15 - 41 U/L 18  21  25    ALT 0 - 44 U/L 13  15  20         RADIOGRAPHIC STUDIES (from last 24 hours if applicable) I have personally reviewed the radiological images as listed and agreed with the findings in the report. No results found.      Visit Diagnosis: 1. Malignant neoplasm of upper-inner quadrant of left breast in female, estrogen receptor positive (HCC)   2. Pain of right upper extremity      No orders of the defined types were placed in this encounter.   All questions were answered. The patient knows to call the clinic with any  problems, questions or concerns. No barriers to learning was detected.  A total of more than 20 minutes were spent on this encounter with face-to-face time and non-face-to-face time, including preparing to see the patient, ordering tests and/or medications, counseling the patient and coordination of care as outlined above.    Thank you for allowing me to participate in the care of this patient.    Shanon Ace, PA-C Department of Hematology/Oncology [facility] at [facility] Phone: [phone]  [phone]    07/15/2023 4:18 PM

## 2023-07-15 ENCOUNTER — Other Ambulatory Visit: Payer: Self-pay | Admitting: *Deleted

## 2023-07-15 ENCOUNTER — Inpatient Hospital Stay: Payer: Medicare PPO | Attending: Physician Assistant | Admitting: Physician Assistant

## 2023-07-15 VITALS — BP 98/67 | HR 77 | Temp 97.2°F | Resp 15 | Ht 66.0 in | Wt 146.8 lb

## 2023-07-15 DIAGNOSIS — C50411 Malignant neoplasm of upper-outer quadrant of right female breast: Secondary | ICD-10-CM | POA: Diagnosis not present

## 2023-07-15 DIAGNOSIS — Z170 Estrogen receptor positive status [ER+]: Secondary | ICD-10-CM

## 2023-07-15 DIAGNOSIS — Z79811 Long term (current) use of aromatase inhibitors: Secondary | ICD-10-CM | POA: Insufficient documentation

## 2023-07-15 DIAGNOSIS — M79601 Pain in right arm: Secondary | ICD-10-CM | POA: Diagnosis not present

## 2023-07-15 DIAGNOSIS — C50212 Malignant neoplasm of upper-inner quadrant of left female breast: Secondary | ICD-10-CM

## 2023-07-15 DIAGNOSIS — I890 Lymphedema, not elsewhere classified: Secondary | ICD-10-CM

## 2023-07-15 DIAGNOSIS — M79621 Pain in right upper arm: Secondary | ICD-10-CM

## 2023-08-01 NOTE — Therapy (Signed)
 OUTPATIENT PHYSICAL THERAPY  UPPER EXTREMITY ONCOLOGY EVALUATION  Patient Name: Krista Butler MRN: [id number] DOB:[birth year], 73 y.o., female Today's Date: 08/02/2023  END OF SESSION:  PT End of Session - 08/02/23 1150     Visit Number 1    Number of Visits 3    Date for PT Re-Evaluation 08/30/23    PT Start Time 1106    PT Stop Time 1151    PT Time Calculation (min) 45 min    Activity Tolerance Patient tolerated treatment well    Behavior During Therapy WFL for tasks assessed/performed             Past Medical History:  Diagnosis Date   Allergy    medicated for this   Arm fracture    LEFT arm; as a child   Breast cancer (HCC)    left   Diverticulosis    Family history of breast cancer    Family history of kidney cancer    GERD (gastroesophageal reflux disease)    History of radiation therapy 12/25/12-02/08/13   64.4 Gy to left breast   Osteoporosis    Personal history of radiation therapy    Thyroid  disease    Wears glasses    Past Surgical History:  Procedure Laterality Date   BREAST BIOPSY  1997   lt breast cyst   BREAST LUMPECTOMY Left 2014   BREAST LUMPECTOMY Right 2021   BREAST LUMPECTOMY WITH NEEDLE LOCALIZATION Left 11/23/2012   Procedure: BREAST LUMPECTOMY WITH NEEDLE LOCALIZATION;  Surgeon: Donnice Bury, MD;  Location: [facility];  Service: General;  Laterality: Left;   BREAST LUMPECTOMY WITH RADIOACTIVE SEED AND SENTINEL LYMPH NODE BIOPSY Right 12/26/2019   Procedure: RIGHT BREAST LUMPECTOMY WITH RADIOACTIVE SEED AND RIGHT AXILLARY SENTINEL LYMPH NODE BIOPSY;  Surgeon: Bury Donnice, MD;  Location: [facility];  Service: General;  Laterality: Right;   COLONOSCOPY     TONSILLECTOMY     UPPER GI ENDOSCOPY  2012   Patient Active Problem List   Diagnosis Date Noted   Genetic testing 12/05/2019   Family history of breast cancer    Family history of kidney cancer    Malignant neoplasm of upper-outer quadrant of  right breast in female, estrogen receptor positive (HCC) 11/21/2019   Infiltrating ductal carcinoma of breast (HCC) 11/15/2019   Malignant tumor of breast (HCC) 04/30/2019   GERD (gastroesophageal reflux disease) 04/30/2019   Osteoporosis 04/25/2019   Malignant neoplasm of upper-inner quadrant of left breast in female, estrogen receptor positive (HCC) 11/09/2012   DIVERTICULOSIS-COLON 02/02/2010   DYSPHAGIA UNSPECIFIED 02/02/2010   DYSPHAGIA 02/02/2010    PCP: Garnette Ore, MD  REFERRING PROVIDER: Amber Stalls, MD  REFERRING DIAG:  Diagnosis  C50.212,Z17.0 (ICD-10-CM) - Malignant neoplasm of upper-inner quadrant of left breast in female, estrogen receptor positive (HCC)  I89.0 (ICD-10-CM) - Lymphedema  M79.621 (ICD-10-CM) - Pain in right axilla    THERAPY DIAG:  Malignant neoplasm of upper-outer quadrant of right breast in female, estrogen receptor positive [facility])  Aftercare following surgery for neoplasm  Pain in right arm  ONSET DATE: 06/15/23  Rationale for Evaluation and Treatment: Rehabilitation  SUBJECTIVE:  SUBJECTIVE STATEMENT: I was doing PT for my neck and I had to do some exercises with my back against the wall and I started to get some Rt upper quadrant pain.  I have not been in PT x 2 weeks and it feel not back to normal but now it feels more like a tightness.    PERTINENT HISTORY: Rt breast cancer with lumpectomy and 6 nodes removed followed by radiation in 2021.  Lt breast cancer in 2014 with lumpectomy and no nodes removed.  Other hx:  Osteoporosis,   PAIN:  Are you having pain? No  PRECAUTIONS: Lt UE lymphedema risk   RED FLAGS: None   WEIGHT BEARING RESTRICTIONS: No  FALLS:  Has patient fallen in last 6 months? No  LIVING ENVIRONMENT: Lives with: lives with their  family and lives with their spouse  OCCUPATION: retired   LEISURE: I go to fitness classes a few times per week and use light weights.    HAND DOMINANCE: right   PRIOR LEVEL OF FUNCTION: Independent  PATIENT GOALS: see if I am doing okay.  What are the best stretches I can do?   OBJECTIVE: Note: Objective measures were completed at Evaluation unless otherwise noted.  COGNITION: Overall cognitive status: Within functional limits for tasks assessed   PALPATION: Rt lumpectomy incision tight in overhead position, pectoralis tight in overhead position WNL in neutral  OBSERVATIONS / OTHER ASSESSMENTS: some Rt lump incision puckering   POSTURE: WNL  UPPER EXTREMITY AROM/PROM:  A/PROM RIGHT   eval   Shoulder extension 50  Shoulder flexion 151 - tight lateral trunk  Shoulder abduction 165  Shoulder internal rotation WNL  Shoulder external rotation WNL behind the head     (Blank rows = not tested)  A/PROM LEFT   eval  Shoulder extension 50  Shoulder flexion 151  Shoulder abduction 165  Shoulder internal rotation WNL   Shoulder external rotation WNL behind the head     (Blank rows = not tested)  CERVICAL AROM: All within normal limits:   UPPER EXTREMITY STRENGTH: not tested  LYMPHEDEMA ASSESSMENTS: see above  L-DEX LYMPHEDEMA SCREENING: The patient was assessed using the L-Dex machine today to produce a lymphedema LDEX score.  She is -0.2 which is not diagnostic without a baseline but shows no significant lymphedema at this time.   QUICK DASH SURVEY: 11.36% with PT estimated score                                                                                                                            TREATMENT DATE:  08/02/23 Eval performed Supine arm propped overhead on pillow STM to Rt lumpectomy incision with cocoa butter, also to pectoralis and lateral breast MFR to Rt axilla, incision with some prolonged hold with hand opposing force Taught chest stretch on  wall and in door with handout.    PATIENT EDUCATION:  Education details: per today's note Person educated: Patient Education method: Programmer, Multimedia, Demonstration,  Verbal cues, and Handouts Education comprehension: verbalized understanding  HOME EXERCISE PROGRAM: Access Code: URL: [email or web address] Date: 08/02/2023 Prepared by: Saddie Raw  Exercises - Standing Pec Stretch at Wall  - 1 x daily - 7 x weekly - 1-3 sets - 10 reps - 20-30 seconds hold - Single Arm Doorway Pec Stretch at 90 Degrees Abduction  - 1 x daily - 7 x weekly - 10 reps - 20-30 second hold  ASSESSMENT:  CLINICAL IMPRESSION: Patient is a 73 y.o. female who was seen today for physical therapy evaluation and treatment for a recent onset of Rt upper quadrant pain after doing new exercises in her other PT sessions.  This pain has now decreased overall but she still feels tight.  She has no signs of lymphedema in the arm or breast. She has noted tightness in the pectoralis and scar / incision at the breast with overhead position only.  Overall she is doing well and we discussed visits vs HEP and we will do at least 1 more visit as pt felt improvement after MT today.    OBJECTIVE IMPAIRMENTS: decreased knowledge of condition, decreased ROM, and increased fascial restrictions.   ACTIVITY LIMITATIONS: none  PARTICIPATION LIMITATIONS: none  PERSONAL FACTORS: 1-2 comorbidities: SLNB, radiation hx  are also affecting patient's functional outcome.   REHAB POTENTIAL: Excellent  CLINICAL DECISION MAKING: Stable/uncomplicated  EVALUATION COMPLEXITY: Low  GOALS: Goals reviewed with patient? Yes  SHORT TERM GOALS=LTGs: Target date: 08/29/22  Pt will improve Rt overhead reach to no pull  Baseline: Goal status: INITIAL  2.  Pt will be ind with HEP for scar massage and stretches Baseline:  Goal status: INITIAL   PLAN:  PT FREQUENCY: 1x/week  PT DURATION: 4 weeks  PLANNED INTERVENTIONS:  97110-Therapeutic exercises, 97535- Self Care, [id number]- Manual therapy, Patient/Family education, Therapeutic exercises, Therapeutic activity, and Self Care  PLAN FOR NEXT SESSION: Rt scar tissue and pect work - try cupping  Raw Saddie SAUNDERS, PT 08/02/2023, 11:51 AM

## 2023-08-02 ENCOUNTER — Encounter: Payer: Self-pay | Admitting: Rehabilitation

## 2023-08-02 ENCOUNTER — Other Ambulatory Visit: Payer: Self-pay

## 2023-08-02 ENCOUNTER — Ambulatory Visit: Payer: Medicare PPO | Attending: Hematology and Oncology | Admitting: Rehabilitation

## 2023-08-02 DIAGNOSIS — M79621 Pain in right upper arm: Secondary | ICD-10-CM | POA: Diagnosis not present

## 2023-08-02 DIAGNOSIS — Z483 Aftercare following surgery for neoplasm: Secondary | ICD-10-CM | POA: Insufficient documentation

## 2023-08-02 DIAGNOSIS — M79601 Pain in right arm: Secondary | ICD-10-CM | POA: Diagnosis not present

## 2023-08-02 DIAGNOSIS — I890 Lymphedema, not elsewhere classified: Secondary | ICD-10-CM | POA: Diagnosis not present

## 2023-08-02 DIAGNOSIS — C50411 Malignant neoplasm of upper-outer quadrant of right female breast: Secondary | ICD-10-CM | POA: Diagnosis not present

## 2023-08-02 DIAGNOSIS — C50212 Malignant neoplasm of upper-inner quadrant of left female breast: Secondary | ICD-10-CM | POA: Insufficient documentation

## 2023-08-02 DIAGNOSIS — Z170 Estrogen receptor positive status [ER+]: Secondary | ICD-10-CM | POA: Diagnosis not present

## 2023-08-11 ENCOUNTER — Ambulatory Visit: Payer: Self-pay | Admitting: Rehabilitation

## 2023-08-11 ENCOUNTER — Encounter: Payer: Self-pay | Admitting: Rehabilitation

## 2023-08-11 DIAGNOSIS — Z483 Aftercare following surgery for neoplasm: Secondary | ICD-10-CM | POA: Diagnosis not present

## 2023-08-11 DIAGNOSIS — C50411 Malignant neoplasm of upper-outer quadrant of right female breast: Secondary | ICD-10-CM | POA: Diagnosis not present

## 2023-08-11 DIAGNOSIS — Z170 Estrogen receptor positive status [ER+]: Secondary | ICD-10-CM | POA: Diagnosis not present

## 2023-08-11 DIAGNOSIS — C50212 Malignant neoplasm of upper-inner quadrant of left female breast: Secondary | ICD-10-CM | POA: Diagnosis not present

## 2023-08-11 DIAGNOSIS — M79621 Pain in right upper arm: Secondary | ICD-10-CM | POA: Diagnosis not present

## 2023-08-11 DIAGNOSIS — I890 Lymphedema, not elsewhere classified: Secondary | ICD-10-CM | POA: Diagnosis not present

## 2023-08-11 DIAGNOSIS — M79601 Pain in right arm: Secondary | ICD-10-CM

## 2023-08-11 NOTE — Therapy (Signed)
OUTPATIENT PHYSICAL THERAPY  UPPER EXTREMITY ONCOLOGY TREATMENT  Patient Name: Krista Butler MRN: [id number] DOB:02-Jul-1951, 73 y.o., female Today's Date: 08/11/2023  END OF SESSION:  PT End of Session - 08/11/23 1258     Visit Number 2    Number of Visits 2    Date for PT Re-Evaluation 08/30/23    PT Start Time 1210    PT Stop Time 1250    PT Time Calculation (min) 40 min    Activity Tolerance Patient tolerated treatment well    Behavior During Therapy WFL for tasks assessed/performed              Past Medical History:  Diagnosis Date   Allergy    medicated for this   Arm fracture    LEFT arm; as a child   Breast cancer (HCC)    left   Diverticulosis    Family history of breast cancer    Family history of kidney cancer    GERD (gastroesophageal reflux disease)    History of radiation therapy 12/25/12-02/08/13   64.4 Gy to left breast   Osteoporosis    Personal history of radiation therapy    Thyroid disease    Wears glasses    Past Surgical History:  Procedure Laterality Date   BREAST BIOPSY  1997   lt breast cyst   BREAST LUMPECTOMY Left 2014   BREAST LUMPECTOMY Right 2021   BREAST LUMPECTOMY WITH NEEDLE LOCALIZATION Left 11/23/2012   Procedure: BREAST LUMPECTOMY WITH NEEDLE LOCALIZATION;  Surgeon: [name], MD;  Location: [facility];  Service: General;  Laterality: Left;   BREAST LUMPECTOMY WITH RADIOACTIVE SEED AND SENTINEL LYMPH NODE BIOPSY Right 12/26/2019   Procedure: RIGHT BREAST LUMPECTOMY WITH RADIOACTIVE SEED AND RIGHT AXILLARY SENTINEL LYMPH NODE BIOPSY;  Surgeon: [name], MD;  Location: [facility];  Service: General;  Laterality: Right;   COLONOSCOPY     TONSILLECTOMY     UPPER GI ENDOSCOPY  2012   Patient Active Problem List   Diagnosis Date Noted   Genetic testing 12/05/2019   Family history of breast cancer    Family history of kidney cancer    Malignant neoplasm of upper-outer quadrant of  right breast in female, estrogen receptor positive (HCC) 11/21/2019   Infiltrating ductal carcinoma of breast (HCC) 11/15/2019   Malignant tumor of breast (HCC) 04/30/2019   GERD (gastroesophageal reflux disease) 04/30/2019   Osteoporosis 04/25/2019   Malignant neoplasm of upper-inner quadrant of left breast in female, estrogen receptor positive (HCC) 11/09/2012   DIVERTICULOSIS-COLON 02/02/2010   DYSPHAGIA UNSPECIFIED 02/02/2010   DYSPHAGIA 02/02/2010    PCP: Adrian Prince, MD  REFERRING PROVIDER: Rachel Moulds, MD  REFERRING DIAG:  Diagnosis  C50.212,Z17.0 (ICD-10-CM) - Malignant neoplasm of upper-inner quadrant of left breast in female, estrogen receptor positive (HCC)  I89.0 (ICD-10-CM) - Lymphedema  M79.621 (ICD-10-CM) - Pain in right axilla    THERAPY DIAG:  Malignant neoplasm of upper-outer quadrant of right breast in female, estrogen receptor positive [facility])  Aftercare following surgery for neoplasm  Pain in right arm  ONSET DATE: 06/15/23  Rationale for Evaluation and Treatment: Rehabilitation  SUBJECTIVE:  SUBJECTIVE STATEMENT:  I feel like it is back to baseline.    EVAL: I was doing PT for my neck and I had to do some exercises with my back against the wall and I started to get some Rt upper quadrant pain.  I have not been in PT x 2 weeks and it feel not back to normal but now it feels more like a tightness.    PERTINENT HISTORY: Rt breast cancer with lumpectomy and 6 nodes removed followed by radiation in 2021.  Lt breast cancer in 2014 with lumpectomy and no nodes removed.  Other hx:  Osteoporosis,   PAIN:  Are you having pain? No  PRECAUTIONS: Lt UE lymphedema risk   RED FLAGS: None   WEIGHT BEARING RESTRICTIONS: No  FALLS:  Has patient fallen in last 6 months?  No  LIVING ENVIRONMENT: Lives with: lives with their family and lives with their spouse  OCCUPATION: retired   LEISURE: I go to fitness classes a few times per week and use light weights.    HAND DOMINANCE: right   PRIOR LEVEL OF FUNCTION: Independent  PATIENT GOALS: see if I am doing okay.  What are the best stretches I can do?   OBJECTIVE: Note: Objective measures were completed at Evaluation unless otherwise noted.  COGNITION: Overall cognitive status: Within functional limits for tasks assessed   PALPATION: Rt lumpectomy incision tight in overhead position, pectoralis tight in overhead position WNL in neutral  OBSERVATIONS / OTHER ASSESSMENTS: some Rt lump incision puckering   POSTURE: WNL  UPPER EXTREMITY AROM/PROM:  A/PROM RIGHT   eval  08/11/23  Shoulder extension 50   Shoulder flexion 151 - tight lateral trunk 151 - tight lateral trunk  Shoulder abduction 165   Shoulder internal rotation WNL   Shoulder external rotation WNL behind the head      (Blank rows = not tested)  A/PROM LEFT   eval  Shoulder extension 50  Shoulder flexion 151  Shoulder abduction 165  Shoulder internal rotation WNL   Shoulder external rotation WNL behind the head     (Blank rows = not tested)  CERVICAL AROM: All within normal limits:   UPPER EXTREMITY STRENGTH: not tested  LYMPHEDEMA ASSESSMENTS: see above  L-DEX LYMPHEDEMA SCREENING: The patient was assessed using the L-Dex machine today to produce a lymphedema LDEX score.  She is -0.2 which is not diagnostic without a baseline but shows no significant lymphedema at this time.   QUICK DASH SURVEY: 11.36% with PT estimated score                                                                                                                            TREATMENT DATE:  08/02/23 Eval performed Supine arm propped overhead on pillow STM to Rt lumpectomy incision with cocoa butter, also to pectoralis and lateral breast MFR to Rt  axilla, incision with some prolonged hold with hand opposing force Taught chest stretch on wall  and in door with handout.   08/11/23 Supine arm propped overhead on pillow STM to Rt lumpectomy incision with cocoa butter, also to pectoralis and lateral breast. No cording noted. No cupping needed due to no fascial restrictions.  MFR to Rt axilla, incision with some prolonged hold with hand opposing force Wall stretch x30" Back against wall alternating flexionx 2 with full reach   PATIENT EDUCATION:  Education details: per today's note Person educated: Patient Education method: Explanation, Demonstration, Verbal cues, and Handouts Education comprehension: verbalized understanding  HOME EXERCISE PROGRAM: Access Code: URL: [email or web address] Date: 08/02/2023 Prepared by: Gwenevere Abbot  Exercises - Standing Pec Stretch at Wall  - 1 x daily - 7 x weekly - 1-3 sets - 10 reps - 20-30 seconds hold - Single Arm Doorway Pec Stretch at 90 Degrees Abduction  - 1 x daily - 7 x weekly - 10 reps - 20-30 second hold  ASSESSMENT:  CLINICAL IMPRESSION: Pt now feels back to baseline.  She notes a small tightness in the lateral trunk with full flexion but very mild.  No fascial restrictions noted today with STM.  No need for cupping.  +1 ttp to pectoralis with deeper thumb pressure.  Will DC today.   OBJECTIVE IMPAIRMENTS: decreased knowledge of condition, decreased ROM, and increased fascial restrictions.   ACTIVITY LIMITATIONS: none  PARTICIPATION LIMITATIONS: none  PERSONAL FACTORS: 1-2 comorbidities: SLNB, radiation hx  are also affecting patient's functional outcome.   REHAB POTENTIAL: Excellent  CLINICAL DECISION MAKING: Stable/uncomplicated  EVALUATION COMPLEXITY: Low  GOALS: Goals reviewed with patient? Yes  SHORT TERM GOALS=LTGs: Target date: 08/29/22  Pt will improve Rt overhead reach to no pull  Baseline: Goal status: MET  2.  Pt will be ind with HEP for  scar massage and stretches Baseline:  Goal status: MET   PLAN:  PT FREQUENCY: 1x/week  PT DURATION: 4 weeks  PLANNED INTERVENTIONS: 97110-Therapeutic exercises, 97535- Self Care, 62130- Manual therapy, Patient/Family education, Therapeutic exercises, Therapeutic activity, and Self Care  PHYSICAL THERAPY DISCHARGE SUMMARY  Visits from Start of Care: 2  Current functional level related to goals / functional outcomes: See above   Remaining deficits: Lymphedema risk   Education / Equipment: HEP  Plan: Patient agrees to discharge.  Patient is being discharged due to meeting the stated rehab goals.        PLAN FOR NEXT SESSION: Rt scar tissue and pect work - try cupping  Idamae Lusher, PT 08/11/2023, 12:58 PM

## 2023-08-12 ENCOUNTER — Ambulatory Visit: Payer: Medicare PPO | Admitting: Hematology and Oncology

## 2023-08-16 ENCOUNTER — Inpatient Hospital Stay: Payer: Medicare PPO | Attending: Physician Assistant

## 2023-08-16 ENCOUNTER — Telehealth: Payer: Self-pay | Admitting: *Deleted

## 2023-08-16 ENCOUNTER — Encounter: Payer: Self-pay | Admitting: Hematology and Oncology

## 2023-08-16 ENCOUNTER — Other Ambulatory Visit: Payer: Self-pay | Admitting: *Deleted

## 2023-08-16 ENCOUNTER — Inpatient Hospital Stay: Payer: Medicare PPO | Attending: Physician Assistant | Admitting: Hematology and Oncology

## 2023-08-16 VITALS — BP 112/60 | HR 87 | Temp 98.0°F | Resp 17 | Wt 145.9 lb

## 2023-08-16 DIAGNOSIS — Z170 Estrogen receptor positive status [ER+]: Secondary | ICD-10-CM

## 2023-08-16 DIAGNOSIS — C50411 Malignant neoplasm of upper-outer quadrant of right female breast: Secondary | ICD-10-CM | POA: Diagnosis not present

## 2023-08-16 DIAGNOSIS — M810 Age-related osteoporosis without current pathological fracture: Secondary | ICD-10-CM | POA: Diagnosis not present

## 2023-08-16 DIAGNOSIS — C50212 Malignant neoplasm of upper-inner quadrant of left female breast: Secondary | ICD-10-CM

## 2023-08-16 DIAGNOSIS — M79603 Pain in arm, unspecified: Secondary | ICD-10-CM | POA: Insufficient documentation

## 2023-08-16 DIAGNOSIS — F1090 Alcohol use, unspecified, uncomplicated: Secondary | ICD-10-CM | POA: Diagnosis not present

## 2023-08-16 DIAGNOSIS — Z79810 Long term (current) use of selective estrogen receptor modulators (SERMs): Secondary | ICD-10-CM | POA: Diagnosis not present

## 2023-08-16 LAB — CMP (CANCER CENTER ONLY)
ALT: 11 U/L (ref 0–44)
AST: 18 U/L (ref 15–41)
Albumin: 4.1 g/dL (ref 3.5–5.0)
Alkaline Phosphatase: 65 U/L (ref 38–126)
Anion gap: 5 (ref 5–15)
BUN: 11 mg/dL (ref 8–23)
CO2: 28 mmol/L (ref 22–32)
Calcium: 9.2 mg/dL (ref 8.9–10.3)
Chloride: 107 mmol/L (ref 98–111)
Creatinine: 0.58 mg/dL (ref 0.44–1.00)
GFR, Estimated: >60 mL/min (ref >60)
Glucose, Bld: 94 mg/dL (ref 70–99)
Potassium: 3.9 mmol/L (ref 3.5–5.1)
Sodium: 140 mmol/L (ref 135–145)
Total Bilirubin: 0.5 mg/dL (ref 0.0–1.2)
Total Protein: 7.1 g/dL (ref 6.5–8.1)

## 2023-08-16 LAB — CBC WITH DIFFERENTIAL (CANCER CENTER ONLY)
Abs Immature Granulocytes: 0.01 10*3/uL (ref 0.00–0.07)
Basophils Absolute: 0 10*3/uL (ref 0.0–0.1)
Basophils Relative: 1 %
Eosinophils Absolute: 0.1 10*3/uL (ref 0.0–0.5)
Eosinophils Relative: 2 %
HCT: 42.6 % (ref 36.0–46.0)
Hemoglobin: 14.1 g/dL (ref 12.0–15.0)
Immature Granulocytes: 0 %
Lymphocytes Relative: 36 %
Lymphs Abs: 1.4 10*3/uL (ref 0.7–4.0)
MCH: 30.5 pg (ref 26.0–34.0)
MCHC: 33.1 g/dL (ref 30.0–36.0)
MCV: 92.2 fL (ref 80.0–100.0)
Monocytes Absolute: 0.4 10*3/uL (ref 0.1–1.0)
Monocytes Relative: 10 %
Neutro Abs: 2 10*3/uL (ref 1.7–7.7)
Neutrophils Relative %: 51 %
Platelet Count: 210 10*3/uL (ref 150–400)
RBC: 4.62 MIL/uL (ref 3.87–5.11)
RDW: 11.7 % (ref 11.5–15.5)
WBC Count: 3.9 10*3/uL — ABNORMAL LOW (ref 4.0–10.5)
nRBC: 0 % (ref 0.0–0.2)

## 2023-08-16 NOTE — Telephone Encounter (Addendum)
-----   Message from Chelsea Iruku sent at 08/16/2023 11:04 AM EST ----- Can we please fax the bone density order to [facility].  Thanks,  Above order faxed.

## 2023-08-16 NOTE — Progress Notes (Signed)
[facility]  [phone] [phone]    D: ARFA WEISSMAN  DOB: 1950/11/08  MR#: [id number]  WVP#:[id number]   Patient Care Team: Adrian Prince, MD as PCP - General (Endocrinology) Dorothy Puffer, MD as Consulting Physician (Radiation Oncology) [name], MD as Consulting Physician (General Surgery) Richarda Overlie, MD as Consulting Physician (Obstetrics and Gynecology) Rachel Moulds, MD as Consulting Physician (Hematology and Oncology)  CHIEF COMPLAINT: Estrogen receptor positive right breast cancer; history of noninvasive left breast cancer.  CURRENT THERAPY: Tamoxifen,  INTERVAL HISTORY:  Discussed the use of AI scribe software for clinical note transcription with the patient, who gave verbal consent to proceed.  History of Present Illness    The patient, with a history of breast cancer and osteoporosis, presents for a follow-up visit. She is currently on tamoxifen, which she has been taking for over a year, and reports doing well with no significant side effects. She was previously on anastrozole, but it was switched to tamoxifen due to side effects including joint pains and brain fog.  The patient has not been taking any medication for osteoporosis, but has had a DEXA scan in the past and is considering treatment with Prolia. She expresses a desire to have another DEXA scan to assess her current bone density status.  She also reports some soreness around her scar tissue from previous breast cancer surgery, which has improved with physical therapy.   The patient has extremely dense breasts and has been having regular mammograms, the most recent of which was reported as normal. She expresses interest in having an MRI for further evaluation due to the density of her breasts.   COVID 19 VACCINATION STATUS: infection 02/2020 and May 2022, no vaccinations   RIGHT BREAST CANCER HISTORY: From the original intake note:   She underwent  bilateral diagnostic mammography with tomography and right breast ultrasonography at [facility] on 11/08/2019 showing: breast density category D; suspicious 2.5 cm right breast mass at 10 o'clock, muscle involvement cannot be excluded; no evidence of left breast malignancy or lymphadenopathy.  She proceeded to biopsy on 11/13/2019 of the right breast area in question. Pathology from the procedure ([id number]) revealed: invasive mammary carcinoma, grade 2, e-cadherin negative. Prognostic indicators significant for: estrogen receptor 95% positive, progesterone receptor 95% positive, both with a strong staining intensity. Proliferation marker Ki67 of 20%. Her2 negative by immunohistochemistry (1+).   History of left breast cancer: From Dr Feliz Beam earlier note:  #1Patient noted some thickening of the right breast. She did not have any symptoms in the left breast. The thickening did not show any discrete mass or distortion on the right. However the patient was found to have numerous bilateral calcifications. Within the left breast there was a new linear/branching calcifications measuring 12 mm. This was suspicious for malignancy. There are also 1.1 cm suspicious calcifications in a heterogeneous group within the upper inner quadrant of the right breast. She underwent stereotactic biopsy of both of these areas  #2The biopsy on the right was negative and only noted to be a fibroadenoma with associated coarse calcifications. 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  #3 patient had a MRI performed that showed a clot area of enhancement in the retroareolar region of the left breast measuring 1 cm in maximum does mention corresponding to the known DCIS.  #4 patient is now status post left-sided lumpectomy on 11/23/2012. Her pathology revealed DCIS grade  3. Margins were negative with the closest margin being 1 mm from the  inferior posterior margins. Tumor was ER ER positive PR positive.  #5 status post completion of radiation therapy to the left breast on 02/08/2013. #6 patient will proceed with after her radiation with chemoprevention with tamoxifen 20 mg daily. But this will begin after she completes radiation therapy.  #7 NSABP B. 43 clinical study enrollment. He did meet with the nurse and her tissue will be sent for HER-2 testing. HER-2 negative and therefore did not enroll on B. 43 trial  #8 patient began adjuvant curative intent tamoxifen 20 mg daily starting 02/26/2013. Total of 5 years of therapy is planned.   MEDICAL HISTORY: Past Medical History:  Diagnosis Date   Allergy    medicated for this   Arm fracture    LEFT arm; as a child   Breast cancer (HCC)    left   Diverticulosis    Family history of breast cancer    Family history of kidney cancer    GERD (gastroesophageal reflux disease)    History of radiation therapy 12/25/12-02/08/13   64.4 Gy to left breast   Osteoporosis    Personal history of radiation therapy    Thyroid disease    Wears glasses     SURGICAL HISTORY:  Past Surgical History:  Procedure Laterality Date   BREAST BIOPSY  1997   lt breast cyst   BREAST LUMPECTOMY Left 2014   BREAST LUMPECTOMY Right 2021   BREAST LUMPECTOMY WITH NEEDLE LOCALIZATION Left 11/23/2012   Procedure: BREAST LUMPECTOMY WITH NEEDLE LOCALIZATION;  Surgeon: [name], MD;  Location: [facility];  Service: General;  Laterality: Left;   BREAST LUMPECTOMY WITH RADIOACTIVE SEED AND SENTINEL LYMPH NODE BIOPSY Right 12/26/2019   Procedure: RIGHT BREAST LUMPECTOMY WITH RADIOACTIVE SEED AND RIGHT AXILLARY SENTINEL LYMPH NODE BIOPSY;  Surgeon: [name], MD;  Location: [facility];  Service: General;  Laterality: Right;   COLONOSCOPY     TONSILLECTOMY     UPPER GI ENDOSCOPY  2012    FAMILY HISTORY: Family History  Problem Relation Age of Onset    Heart disease Mother    Hypertension Mother    Stroke Father    Heart disease Father    Diabetes Father    Depression Sister    Hyperlipidemia Sister    Stroke Maternal Grandmother    Stroke Maternal Grandfather    Heart Problems Paternal Grandfather    Breast cancer Cousin        dx. younger than 32 (paternal cousin)   Cancer Cousin        dx. in her 43s, unknown type (paternal cousin)   Kidney cancer Cousin 49       (maternal cousin)   Other Cousin        genetic testing - unknown results (maternal cousin)   Colon cancer Neg Hx    Esophageal cancer Neg Hx    Pancreatic cancer Neg Hx    Stomach cancer Neg Hx    Liver disease Neg Hx   The patient's father died at the age of 62 following a stroke in the setting of severe heart disease. The patient's mother died also from heart disease at the age of 19. The patient had no brothers, one sister. There is no history of breast or ovarian cancer in the family.   GYN HISTORY: Menarche age 69, first live birth age 32. The patient is GX P2. She went through  menopause in her early 93s. She took hormone replacement briefly (approximately 2 years).   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 Her husband of 40+ years, Freida Busman, used to work for [employer] but is now a Surveyor, minerals. Daughter Laurena Bering is a homemaker in [place]. Son Annia Belt is a tool and dye Designer, television/film set. The patient has 5 grandchildren. She has one great grandchild. She attends the home church in [place].   ADVANCED DIRECTIVES: In the absence of any documentation to the contrary, the patient's spouse is their HCPOA.    HEALTH MAINTENANCE: Social History   Tobacco Use   Smoking status: Never   Smokeless tobacco: Never  Vaping Use   Vaping status: Never Used  Substance Use Topics   Alcohol use: Yes    Alcohol/week: 2.0 standard drinks of alcohol    Types: 2 Glasses of wine per week    Comment: occ   Drug use: No      Colonoscopy  Pap smear  DEXA scan  Lipid panel  Current Outpatient Medications  Medication Sig Dispense Refill   AMBULATORY NON FORMULARY MEDICATION FungDX 1 capsul by mouth twice a day     AMBULATORY NON FORMULARY MEDICATION Bactrex 1 capsule by mouth twice a day     anastrozole (ARIMIDEX) 1 MG tablet Take 1 tablet (1 mg total) by mouth daily. (Patient not taking: Reported on 07/15/2023) 90 tablet 3   Ascorbic Acid (VITAMIN C PO) Take 1 tablet by mouth daily.     Biotin 1000 MCG tablet Take 1,000 mcg by mouth daily.     Cholecalciferol (VITAMIN D3 PO) Take 2 tablets by mouth daily.     levothyroxine (SYNTHROID, LEVOTHROID) 175 MCG tablet Take 175 mcg by mouth daily before breakfast.     loratadine (CLARITIN) 10 MG tablet Take 10 mg by mouth daily.     MAGNESIUM PO Take 1 Dose by mouth at bedtime. (Patient not taking: Reported on 07/15/2023)     montelukast (SINGULAIR) 10 MG tablet  (Patient not taking: Reported on 07/15/2023)     Multiple Minerals-Vitamins (BONE ESSENTIALS PO) Take 1 tablet by mouth daily.     Multiple Vitamins-Minerals (PHYTOMULTI PO) Take 3 tablets by mouth daily.     Pseudoephedrine-guaiFENesin (MUCINEX D PO) Take 1 capsule by mouth daily. (Patient not taking: Reported on 07/15/2023)     SALINE NA Place into the nose as needed.     tamoxifen (NOLVADEX) 20 MG tablet TAKE 1 TABLET BY MOUTH EVERY DAY 90 tablet 0   zinc gluconate 50 MG tablet Take 50 mg by mouth daily. 30 mg     No current facility-administered medications for this visit.    PHYSICAL EXAMINATION:  white woman who appears stated age  Blood pressure 112/60, pulse 87, temperature 98 F (36.7 C), temperature source Temporal, resp. rate 17, weight 145 lb 14.4 oz (66.2 kg), SpO2 97%. Body mass index is 23.55 kg/m.   ECOG PERFORMANCE STATUS: 1 - Symptomatic but completely ambulatory BREAST: Extremely dense breasts, no obvious masses palpable. No regional adenopathy SKIN: No swelling in  legs.  LABORATORY DATA: Lab Results  Component Value Date   WBC 4.7 02/08/2023   HGB 13.6 02/08/2023   HCT 41.1 02/08/2023   MCV 91.5 02/08/2023   PLT 240 02/08/2023      Chemistry      Component Value Date/Time   NA 140 02/08/2023 0824   NA 142 06/13/2013 1317   K 4.2 02/08/2023 0824  K 4.0 06/13/2013 1317   CL 107 02/08/2023 0824   CL 102 11/15/2012 1224   CO2 27 02/08/2023 0824   CO2 27 06/13/2013 1317   BUN 16 02/08/2023 0824   BUN 13.0 06/13/2013 1317   CREATININE 0.65 02/08/2023 0824   CREATININE 0.7 06/13/2013 1317      Component Value Date/Time   CALCIUM 9.4 02/08/2023 0824   CALCIUM 10.1 06/13/2013 1317   ALKPHOS 98 02/08/2023 0824   ALKPHOS 81 06/13/2013 1317   AST 18 02/08/2023 0824   AST 22 06/13/2013 1317   ALT 13 02/08/2023 0824   ALT 16 06/13/2013 1317   BILITOT 0.4 02/08/2023 0824   BILITOT 0.39 06/13/2013 1317      RESULTS: No results found.   ASSESSMENT:  73 y.o. Jacquenette Shone, [place] woman:  LEFT BREAST CANCER:  (1) status post left lumpectomy 11/23/2012 for ductal carcinoma in situ, grade 3, estrogen receptor 100% positive, progesterone receptor 0% positive, with -4 very close margins  (2) considered  NSABP B-43  but was found to be HER-2 negative ([id number])  (3) completed adjuvant radiation therapy on 02/08/2013.  (4) tamoxifen 20 mg daily started August 2014, discontinued August 2019  RIGHT BREAST CANCER: (5) status post right breast upper outer quadrant biopsy 11/13/2019 for a clinical T2 N0, stage IB invasive lobular carcinoma, E-cadherin negative, estrogen and progesterone receptor positive, with an MIB-1 of 20% and no HER-2 amplification  (6) genetics testing 12/03/2019 through the [facility] STAT + Multi-Cancer panels found no deleterious mutations in ATM, BRCA1, BRCA2, CDH1, CHEK2, PALB2, PTEN, STK11 and TP53. The Multi-Cancer Panel offered by [facility] includes sequencing and/or deletion duplication testing of the following 85  genes: AIP, ALK, APC, ATM, AXIN2,BAP1,  BARD1, BLM, BMPR1A, BRCA1, BRCA2, BRIP1, CASR, CDC73, CDH1, CDK4, CDKN1B, CDKN1C, CDKN2A (p14ARF), CDKN2A (p16INK4a), CEBPA, CHEK2, CTNNA1, DICER1, DIS3L2, EGFR (c.2369C>T, p.Thr790Met variant only), EPCAM (Deletion/duplication testing only), FH, FLCN, GATA2, GPC3, GREM1 (Promoter region deletion/duplication testing only), HOXB13 (c.251G>A, p.Gly84Glu), HRAS, KIT, MAX, MEN1, MET, MITF (c.952G>A, p.Glu318Lys variant only), MLH1, MSH2, MSH3, MSH6, MUTYH, NBN, NF1, NF2, NTHL1, PALB2, PDGFRA, PHOX2B, PMS2, POLD1, POLE, POT1, PRKAR1A, PTCH1, PTEN, RAD50, RAD51C, RAD51D, RB1, RECQL4, RET, RNF43, RUNX1, SDHAF2, SDHA (sequence changes only), SDHB, SDHC, SDHD, SMAD4, SMARCA4, SMARCB1, SMARCE1, STK11, SUFU, TERC, TERT, TMEM127, TP53, TSC1, TSC2, VHL, WRN and WT1.  (7) Oncotype testing on the 11/13/2019 biopsy showed a score of 22, predicting a recurrence outside the breast in the next 9 years of 8% if the patient only systemic therapy was tamoxifen or an aromatase inhibitor for 5 years.  It also predicted no benefit from chemotherapy.  (8) status post right lumpectomy and sentinel lymph node sampling 12/26/2019 for a pT1c pN0, stage 1A invasive lobular carcinoma, with negative margins.  (a) a total of 6 axillary lymph nodes were removed.  (9) adjuvant radiation6/28/21 - 02/14/20  Site/dose:   The patient initially received a dose of 42.56 Gy in 16 fractions to the breast using whole-breast tangent fields. This was delivered using a 3-D conformal technique. The patient then received a boost to the seroma. This delivered an additional 8 Gy in 73fractions using a 3 field photon technique due to the depth of the seroma. The total dose was 50.56 Gy.   (10) anastrozole started 03/10/2020  (a) osteoporosis: considering denosumab/Prolia   (b) referred to pelvic rehab 05/23/2020   (11) Switched back to Tamoxifen in 2024, took it for more than an yr.   PLAN:   Breast Cancer  On  Tamoxifen for over a year with good tolerance. No new breast lumps or changes noted on examination. Extremely dense breasts noted on previous mammograms. -Continue Tamoxifen as prescribed. -Order diagnostic mammogram for July 2025. -Order breast MRI for December 2025 to further evaluate due to extremely dense breasts.  Osteoporosis No current treatment. Last bone density scan in 2021. Patient considering Prolia. -Order bone density scan to assess current status. -Discuss potential treatment options, including Prolia, after reviewing scan results.  Arm Pain Improved with physical therapy. Patient has exercises to do at home. -Encourage continuation of home exercises as instructed by physical therapist.  General Health Maintenance -Continue regular yearly doctor's appointments and mammograms. -Report any vaginal bleeding or leg swelling (potential side effects of Tamoxifen). -Report any abnormal lab results from recent blood work. -Follow-up in one year unless issues arise sooner.  Total time spent: *Total Encounter Time as defined by the Centers for Medicare and Medicaid Services includes, in addition to the face-to-face time of a patient visit (documented in the note above) non-face-to-face time: obtaining and reviewing outside history, ordering and reviewing medications, tests or procedures, care coordination (communications with other health care professionals or caregivers) and documentation in the medical record.

## 2023-08-23 DIAGNOSIS — D2262 Melanocytic nevi of left upper limb, including shoulder: Secondary | ICD-10-CM | POA: Diagnosis not present

## 2023-08-23 DIAGNOSIS — Z85828 Personal history of other malignant neoplasm of skin: Secondary | ICD-10-CM | POA: Diagnosis not present

## 2023-08-23 DIAGNOSIS — D2261 Melanocytic nevi of right upper limb, including shoulder: Secondary | ICD-10-CM | POA: Diagnosis not present

## 2023-08-23 DIAGNOSIS — L905 Scar conditions and fibrosis of skin: Secondary | ICD-10-CM | POA: Diagnosis not present

## 2023-08-23 DIAGNOSIS — D2271 Melanocytic nevi of right lower limb, including hip: Secondary | ICD-10-CM | POA: Diagnosis not present

## 2023-08-23 DIAGNOSIS — L821 Other seborrheic keratosis: Secondary | ICD-10-CM | POA: Diagnosis not present

## 2023-08-23 DIAGNOSIS — D225 Melanocytic nevi of trunk: Secondary | ICD-10-CM | POA: Diagnosis not present

## 2023-09-06 ENCOUNTER — Encounter: Payer: Self-pay | Admitting: Hematology and Oncology

## 2023-09-13 DIAGNOSIS — H18413 Arcus senilis, bilateral: Secondary | ICD-10-CM | POA: Diagnosis not present

## 2023-09-13 DIAGNOSIS — Z961 Presence of intraocular lens: Secondary | ICD-10-CM | POA: Diagnosis not present

## 2023-09-13 DIAGNOSIS — K219 Gastro-esophageal reflux disease without esophagitis: Secondary | ICD-10-CM | POA: Diagnosis not present

## 2023-09-13 DIAGNOSIS — H2512 Age-related nuclear cataract, left eye: Secondary | ICD-10-CM | POA: Diagnosis not present

## 2023-09-13 DIAGNOSIS — R490 Dysphonia: Secondary | ICD-10-CM | POA: Diagnosis not present

## 2023-09-13 DIAGNOSIS — H353131 Nonexudative age-related macular degeneration, bilateral, early dry stage: Secondary | ICD-10-CM | POA: Diagnosis not present

## 2023-09-20 ENCOUNTER — Ambulatory Visit
Admission: RE | Admit: 2023-09-20 | Discharge: 2023-09-20 | Disposition: A | Discharge status: Home / Self Care | Payer: Medicare PPO | Source: Ambulatory Visit | Attending: Hematology and Oncology | Admitting: Hematology and Oncology

## 2023-09-20 DIAGNOSIS — C50212 Malignant neoplasm of upper-inner quadrant of left female breast: Secondary | ICD-10-CM | POA: Diagnosis not present

## 2023-09-20 DIAGNOSIS — Z170 Estrogen receptor positive status [ER+]: Secondary | ICD-10-CM | POA: Diagnosis not present

## 2023-09-20 MED ORDER — GADOPICLENOL 0.5 MMOL/ML IV SOLN
7.0000 mL | Freq: Once | INTRAVENOUS | Status: AC | PRN
  Administered 2023-09-20: 7 mL via INTRAVENOUS

## 2023-09-25 ENCOUNTER — Other Ambulatory Visit: Payer: Self-pay | Admitting: Hematology and Oncology

## 2023-10-11 DIAGNOSIS — Z6823 Body mass index (BMI) 23.0-23.9, adult: Secondary | ICD-10-CM | POA: Diagnosis not present

## 2023-10-11 DIAGNOSIS — M810 Age-related osteoporosis without current pathological fracture: Secondary | ICD-10-CM | POA: Diagnosis not present

## 2023-10-11 DIAGNOSIS — Z01419 Encounter for gynecological examination (general) (routine) without abnormal findings: Secondary | ICD-10-CM | POA: Diagnosis not present

## 2023-10-11 DIAGNOSIS — N952 Postmenopausal atrophic vaginitis: Secondary | ICD-10-CM | POA: Diagnosis not present

## 2023-10-11 DIAGNOSIS — Z853 Personal history of malignant neoplasm of breast: Secondary | ICD-10-CM | POA: Diagnosis not present

## 2023-10-12 DIAGNOSIS — M810 Age-related osteoporosis without current pathological fracture: Secondary | ICD-10-CM | POA: Diagnosis not present

## 2023-10-18 DIAGNOSIS — M810 Age-related osteoporosis without current pathological fracture: Secondary | ICD-10-CM | POA: Diagnosis not present

## 2023-10-19 DIAGNOSIS — H2512 Age-related nuclear cataract, left eye: Secondary | ICD-10-CM | POA: Diagnosis not present

## 2023-11-02 DIAGNOSIS — M810 Age-related osteoporosis without current pathological fracture: Secondary | ICD-10-CM | POA: Diagnosis not present

## 2023-12-26 DIAGNOSIS — J302 Other seasonal allergic rhinitis: Secondary | ICD-10-CM | POA: Diagnosis not present

## 2023-12-26 DIAGNOSIS — J0100 Acute maxillary sinusitis, unspecified: Secondary | ICD-10-CM | POA: Diagnosis not present

## 2023-12-31 ENCOUNTER — Other Ambulatory Visit: Payer: Self-pay | Admitting: Hematology and Oncology

## 2024-02-21 ENCOUNTER — Ambulatory Visit
Admission: RE | Admit: 2024-02-21 | Discharge: 2024-02-21 | Disposition: A | Discharge status: Home / Self Care | Payer: Medicare PPO | Source: Ambulatory Visit | Attending: Hematology and Oncology | Admitting: Hematology and Oncology

## 2024-02-21 DIAGNOSIS — Z1231 Encounter for screening mammogram for malignant neoplasm of breast: Secondary | ICD-10-CM | POA: Diagnosis not present

## 2024-02-21 DIAGNOSIS — C50212 Malignant neoplasm of upper-inner quadrant of left female breast: Secondary | ICD-10-CM

## 2024-02-28 DIAGNOSIS — L309 Dermatitis, unspecified: Secondary | ICD-10-CM | POA: Diagnosis not present

## 2024-02-28 DIAGNOSIS — Z85828 Personal history of other malignant neoplasm of skin: Secondary | ICD-10-CM | POA: Diagnosis not present

## 2024-03-13 DIAGNOSIS — M816 Localized osteoporosis [Lequesne]: Secondary | ICD-10-CM | POA: Diagnosis not present

## 2024-03-13 DIAGNOSIS — N958 Other specified menopausal and perimenopausal disorders: Secondary | ICD-10-CM | POA: Diagnosis not present

## 2024-03-20 DIAGNOSIS — R0981 Nasal congestion: Secondary | ICD-10-CM | POA: Diagnosis not present

## 2024-03-20 DIAGNOSIS — R053 Chronic cough: Secondary | ICD-10-CM | POA: Diagnosis not present

## 2024-03-20 DIAGNOSIS — K219 Gastro-esophageal reflux disease without esophagitis: Secondary | ICD-10-CM | POA: Diagnosis not present

## 2024-03-20 DIAGNOSIS — J302 Other seasonal allergic rhinitis: Secondary | ICD-10-CM | POA: Diagnosis not present

## 2024-03-20 DIAGNOSIS — J069 Acute upper respiratory infection, unspecified: Secondary | ICD-10-CM | POA: Diagnosis not present

## 2024-03-29 ENCOUNTER — Other Ambulatory Visit: Payer: Self-pay | Admitting: Hematology and Oncology

## 2024-04-25 DIAGNOSIS — E785 Hyperlipidemia, unspecified: Secondary | ICD-10-CM | POA: Diagnosis not present

## 2024-04-25 DIAGNOSIS — E039 Hypothyroidism, unspecified: Secondary | ICD-10-CM | POA: Diagnosis not present

## 2024-04-25 DIAGNOSIS — M810 Age-related osteoporosis without current pathological fracture: Secondary | ICD-10-CM | POA: Diagnosis not present

## 2024-04-25 DIAGNOSIS — R7309 Other abnormal glucose: Secondary | ICD-10-CM | POA: Diagnosis not present

## 2024-04-25 DIAGNOSIS — Z1212 Encounter for screening for malignant neoplasm of rectum: Secondary | ICD-10-CM | POA: Diagnosis not present

## 2024-05-02 ENCOUNTER — Other Ambulatory Visit: Payer: Self-pay | Admitting: Endocrinology

## 2024-05-02 DIAGNOSIS — K219 Gastro-esophageal reflux disease without esophagitis: Secondary | ICD-10-CM | POA: Diagnosis not present

## 2024-05-02 DIAGNOSIS — Z0000 Encounter for general adult medical examination without abnormal findings: Secondary | ICD-10-CM | POA: Diagnosis not present

## 2024-05-02 DIAGNOSIS — R82998 Other abnormal findings in urine: Secondary | ICD-10-CM | POA: Diagnosis not present

## 2024-05-02 DIAGNOSIS — R7309 Other abnormal glucose: Secondary | ICD-10-CM

## 2024-05-02 DIAGNOSIS — I499 Cardiac arrhythmia, unspecified: Secondary | ICD-10-CM | POA: Diagnosis not present

## 2024-05-02 DIAGNOSIS — C50212 Malignant neoplasm of upper-inner quadrant of left female breast: Secondary | ICD-10-CM | POA: Diagnosis not present

## 2024-05-02 DIAGNOSIS — E042 Nontoxic multinodular goiter: Secondary | ICD-10-CM | POA: Diagnosis not present

## 2024-05-02 DIAGNOSIS — E785 Hyperlipidemia, unspecified: Secondary | ICD-10-CM | POA: Diagnosis not present

## 2024-05-02 DIAGNOSIS — M810 Age-related osteoporosis without current pathological fracture: Secondary | ICD-10-CM | POA: Diagnosis not present

## 2024-05-02 DIAGNOSIS — Z1339 Encounter for screening examination for other mental health and behavioral disorders: Secondary | ICD-10-CM | POA: Diagnosis not present

## 2024-05-02 DIAGNOSIS — Z1331 Encounter for screening for depression: Secondary | ICD-10-CM | POA: Diagnosis not present

## 2024-05-02 DIAGNOSIS — E039 Hypothyroidism, unspecified: Secondary | ICD-10-CM | POA: Diagnosis not present

## 2024-05-08 ENCOUNTER — Ambulatory Visit
Admission: RE | Admit: 2024-05-08 | Discharge: 2024-05-08 | Disposition: A | Discharge status: Home / Self Care | Source: Ambulatory Visit | Attending: Endocrinology | Admitting: Endocrinology

## 2024-05-08 DIAGNOSIS — R7309 Other abnormal glucose: Secondary | ICD-10-CM

## 2024-06-04 DIAGNOSIS — H35363 Drusen (degenerative) of macula, bilateral: Secondary | ICD-10-CM | POA: Diagnosis not present

## 2024-06-24 ENCOUNTER — Other Ambulatory Visit: Payer: Self-pay | Admitting: Hematology and Oncology

## 2024-07-07 ENCOUNTER — Emergency Department (HOSPITAL_BASED_OUTPATIENT_CLINIC_OR_DEPARTMENT_OTHER)
Admission: EM | Admit: 2024-07-07 | Discharge: 2024-07-07 | Disposition: A | Discharge status: Home / Self Care | Attending: Emergency Medicine | Admitting: Emergency Medicine

## 2024-07-07 ENCOUNTER — Encounter (HOSPITAL_BASED_OUTPATIENT_CLINIC_OR_DEPARTMENT_OTHER): Payer: Self-pay | Admitting: Emergency Medicine

## 2024-07-07 ENCOUNTER — Emergency Department (HOSPITAL_BASED_OUTPATIENT_CLINIC_OR_DEPARTMENT_OTHER)

## 2024-07-07 DIAGNOSIS — R5381 Other malaise: Secondary | ICD-10-CM | POA: Insufficient documentation

## 2024-07-07 DIAGNOSIS — Z853 Personal history of malignant neoplasm of breast: Secondary | ICD-10-CM | POA: Insufficient documentation

## 2024-07-07 DIAGNOSIS — R0602 Shortness of breath: Secondary | ICD-10-CM | POA: Insufficient documentation

## 2024-07-07 DIAGNOSIS — R5383 Other fatigue: Secondary | ICD-10-CM | POA: Insufficient documentation

## 2024-07-07 DIAGNOSIS — R42 Dizziness and giddiness: Secondary | ICD-10-CM | POA: Insufficient documentation

## 2024-07-07 DIAGNOSIS — R002 Palpitations: Secondary | ICD-10-CM | POA: Insufficient documentation

## 2024-07-07 LAB — CBC WITH DIFFERENTIAL/PLATELET
Abs Immature Granulocytes: 0.01 K/uL (ref 0.00–0.07)
Basophils Absolute: 0.1 K/uL (ref 0.0–0.1)
Basophils Relative: 1 %
Eosinophils Absolute: 0.1 K/uL (ref 0.0–0.5)
Eosinophils Relative: 2 %
HCT: 41.4 % (ref 36.0–46.0)
Hemoglobin: 13.5 g/dL (ref 12.0–15.0)
Immature Granulocytes: 0 %
Lymphocytes Relative: 30 %
Lymphs Abs: 1.5 K/uL (ref 0.7–4.0)
MCH: 29.9 pg (ref 26.0–34.0)
MCHC: 32.6 g/dL (ref 30.0–36.0)
MCV: 91.6 fL (ref 80.0–100.0)
Monocytes Absolute: 0.4 K/uL (ref 0.1–1.0)
Monocytes Relative: 7 %
Neutro Abs: 3 K/uL (ref 1.7–7.7)
Neutrophils Relative %: 60 %
Platelets: 226 K/uL (ref 150–400)
RBC: 4.52 MIL/uL (ref 3.87–5.11)
RDW: 12 % (ref 11.5–15.5)
WBC: 5.1 K/uL (ref 4.0–10.5)
nRBC: 0 % (ref 0.0–0.2)

## 2024-07-07 LAB — COMPREHENSIVE METABOLIC PANEL WITH GFR
ALT: 24 U/L (ref 0–44)
AST: 28 U/L (ref 15–41)
Albumin: 4.1 g/dL (ref 3.5–5.0)
Alkaline Phosphatase: 84 U/L (ref 38–126)
Anion gap: 12 (ref 5–15)
BUN: 17 mg/dL (ref 8–23)
CO2: 23 mmol/L (ref 22–32)
Calcium: 9.1 mg/dL (ref 8.9–10.3)
Chloride: 102 mmol/L (ref 98–111)
Creatinine, Ser: 0.81 mg/dL (ref 0.44–1.00)
GFR, Estimated: >60 mL/min (ref >60)
Glucose, Bld: 100 mg/dL — ABNORMAL HIGH (ref 70–99)
Potassium: 4 mmol/L (ref 3.5–5.1)
Sodium: 137 mmol/L (ref 135–145)
Total Bilirubin: 0.3 mg/dL (ref 0.0–1.2)
Total Protein: 7.1 g/dL (ref 6.5–8.1)

## 2024-07-07 LAB — URINALYSIS, ROUTINE W REFLEX MICROSCOPIC
Bilirubin Urine: NEGATIVE
Glucose, UA: NEGATIVE mg/dL
Hgb urine dipstick: NEGATIVE
Ketones, ur: NEGATIVE mg/dL
Leukocytes,Ua: NEGATIVE
Nitrite: NEGATIVE
Protein, ur: NEGATIVE mg/dL
Specific Gravity, Urine: 1.015 (ref 1.005–1.030)
pH: 5.5 (ref 5.0–8.0)

## 2024-07-07 LAB — TROPONIN T, HIGH SENSITIVITY: Troponin T High Sensitivity: <15 ng/L (ref 0–19)

## 2024-07-07 NOTE — ED Provider Notes (Signed)
 [place] [facility]ERGENCY DEPARTMENT AT [facility] Provider Note   CSN: [id number] Arrival date & time: 07/07/24  1225     Patient presents with: Palpitations   Krista Butler is a 73 y.o. female.  She has prior history of breast cancer and is on tamoxifen .  She said she has felt increased fatigue for a few days.  Intermittent palpitations which is skipped beats and sometimes rapid heart rate.  No chest pain.  Does feel a little short of breath.  No fevers chills nausea vomiting.  No recent medication changes.  No cough.  No urinary symptoms.   The history is provided by the patient and the spouse.  Palpitations Palpitations quality:  Fast Onset quality:  Gradual Duration:  2 days Timing:  Intermittent Progression:  Unchanged Chronicity:  New Relieved by:  None tried Associated symptoms: dizziness, malaise/fatigue and shortness of breath   Associated symptoms: no chest pain, no cough, no hemoptysis, no nausea and no vomiting        Prior to Admission medications  Medication Sig Start Date End Date Taking? Authorizing Provider  AMBULATORY NON FORMULARY MEDICATION FungDX 1 capsul by mouth twice a day    [provider]  AMBULATORY NON FORMULARY MEDICATION Bactrex 1 capsule by mouth twice a day    [provider]  anastrozole  (ARIMIDEX ) 1 MG tablet Take 1 tablet (1 mg total) by mouth daily. Patient not taking: Reported on 07/15/2023 07/23/22   Iruku, Praveena, MD  Ascorbic Acid (VITAMIN C PO) Take 1 tablet by mouth daily.    [provider]  Biotin 1000 MCG tablet Take 1,000 mcg by mouth daily.    [provider]  Cholecalciferol (VITAMIN D3 PO) Take 2 tablets by mouth daily.    [provider]  levothyroxine (SYNTHROID, LEVOTHROID) 175 MCG tablet Take 175 mcg by mouth daily before breakfast.    [provider]  loratadine (CLARITIN) 10 MG tablet Take 10 mg by mouth daily.    [provider]  MAGNESIUM PO  Take 1 Dose by mouth at bedtime. Patient not taking: Reported on 07/15/2023    [provider]  montelukast (SINGULAIR) 10 MG tablet  12/28/22   [provider]  Multiple Minerals-Vitamins (BONE ESSENTIALS PO) Take 1 tablet by mouth daily.    [provider]  Multiple Vitamins-Minerals (PHYTOMULTI PO) Take 3 tablets by mouth daily.    [provider]  Pseudoephedrine-guaiFENesin [facility] D PO) Take 1 capsule by mouth daily. Patient not taking: Reported on 07/15/2023    [provider]  SALINE NA Place into the nose as needed.    [provider]  tamoxifen  (NOLVADEX ) 20 MG tablet TAKE 1 TABLET BY MOUTH EVERY DAY 06/25/24   Iruku, Praveena, MD  zinc gluconate 50 MG tablet Take 50 mg by mouth daily. 30 mg    [provider]    Allergies: Sulfa antibiotics and Percocet [oxycodone -acetaminophen ]    Review of Systems  Constitutional:  Positive for fatigue and malaise/fatigue. Negative for fever.  Eyes:  Negative for visual disturbance.  Respiratory:  Positive for shortness of breath. Negative for cough and hemoptysis.   Cardiovascular:  Positive for palpitations. Negative for chest pain.  Gastrointestinal:  Negative for nausea and vomiting.  Neurological:  Positive for dizziness.    Updated Vital Signs BP 123/76 (BP Location: Right Arm)   Pulse 90   Temp 98.1 F (36.7 C) (Oral)   Resp 18   Ht 5' 6.5 (1.689 m)  Wt 64.4 kg   SpO2 96%   BMI 22.58 kg/m   Physical Exam Vitals and nursing note reviewed.  Constitutional:      General: She is not in acute distress.    Appearance: Normal appearance. She is well-developed.  HENT:     Head: Normocephalic and atraumatic.  Eyes:     Conjunctiva/sclera: Conjunctivae normal.  Cardiovascular:     Rate and Rhythm: Normal rate and regular rhythm.     Heart sounds: No murmur heard. Pulmonary:     Effort: Pulmonary effort is normal. No respiratory distress.     Breath sounds: Normal  breath sounds.  Abdominal:     Palpations: Abdomen is soft.     Tenderness: There is no abdominal tenderness. There is no guarding or rebound.  Musculoskeletal:        General: No tenderness or deformity.     Cervical back: Neck supple.     Right lower leg: No edema.     Left lower leg: No edema.  Skin:    General: Skin is warm and dry.     Capillary Refill: Capillary refill takes less than 2 seconds.  Neurological:     General: No focal deficit present.     Mental Status: She is alert.     (all labs ordered are listed, but only abnormal results are displayed) Labs Reviewed  COMPREHENSIVE METABOLIC PANEL WITH GFR - Abnormal; Notable for the following components:      Result Value   Glucose, Bld 100 (*)    All other components within normal limits  CBC WITH DIFFERENTIAL/PLATELET  URINALYSIS, ROUTINE W REFLEX MICROSCOPIC  TROPONIN T, HIGH SENSITIVITY    EKG: EKG Interpretation Date/Time:  Saturday July 07 2024 12:35:34 EST Ventricular Rate:  88 PR Interval:  152 QRS Duration:  98 QT Interval:  388 QTC Calculation: 470 R Axis:   59  Text Interpretation: Sinus rhythm slower rate than prior 8/21 Confirmed by Towana Sharper [phone]) on 07/07/2024 12:37:32 PM  Radiology: DG Chest 2 View Result Date: 07/07/2024 EXAM: 2 VIEW(S) XRAY OF THE CHEST 07/07/2024 01:31:00 PM COMPARISON: CT cardiac 05/08/2024, chest x-ray 02/28/2020. CLINICAL HISTORY: CP (Chest Pain). FINDINGS: LUNGS AND PLEURA: Hyperinflation of the lungs. No focal pulmonary opacity. No pleural effusion. No pneumothorax. HEART AND MEDIASTINUM: No acute abnormality of the cardiac and mediastinal silhouettes. BONES AND SOFT TISSUES: Bilateral breast surgical clips. Right axillary surgical clips. Chronic T11 vertebral body height loss. IMPRESSION: 1. No acute cardiopulmonary process. 2. Hyperinflated lungs, compatible with air trapping or obstructive physiology. Electronically signed by: Morgane Naveau MD 07/07/2024  02:10 PM EST RP Workstation: [id number]     Procedures   Medications Ordered in the ED - No data to display  Clinical Course as of 07/07/24 1706  Sat Jul 07, 2024  1342 We discussed pursuing a CAT scan of her chest to evaluate for PE.  She would rather not do that at this time.  She is interested in getting set up for cardiac monitoring as an outpatient.  Does not see a cardiologist so we will put in cardiology referral. [MB]    Clinical Course User Index [MB] Towana Sharper BROCKS, MD                                 Medical Decision Making Amount and/or Complexity of Data Reviewed Labs: ordered. Radiology: ordered.   This patient complains of palpitations, shortness of breath;  this involves an extensive number of treatment Options and is a complaint that carries with it a high risk of complications and morbidity. The differential includes arrhythmia, dehydration, metabolic derangement, infection, PE  I ordered, reviewed and interpreted labs, which included CBC normal chemistries normal urinalysis negative troponin flat I ordered imaging studies which included chest x-ray and I independently    visualized and interpreted imaging which showed no acute findings Additional history obtained from patient's husband Previous records obtained and reviewed in epic including outpatient oncology notes Cardiac monitoring reviewed, sinus rhythm Social determinants considered, no significant barriers Critical Interventions: None  After the interventions stated above, I reevaluated the patient and found patient to be satting well on room air and asymptomatic at this time Admission and further testing considered, she is declining CAT scan or further workup at this time.  She will except a cardiology referral.  Recommended close follow-up with PCP.  Return instructions discussed.      Final diagnoses:  Palpitations  Dizziness    ED Discharge Orders          Ordered    Ambulatory referral  to Cardiology       Comments: If you have not heard from the Cardiology office within the next 72 hours please call [phone].   07/07/24 1429               Towana Ozell BROCKS, MD 07/07/24 [phone]

## 2024-07-07 NOTE — ED Triage Notes (Signed)
 Pt reports palpitations for a few days; also reports lightheadedness, fatigue associated with this

## 2024-07-07 NOTE — Discharge Instructions (Signed)
 You were seen in the emergency department for intermittent palpitations and dizziness.  You had lab work chest x-ray and EKG here that did not show an obvious explanation for your symptoms.  Please keep well-hydrated.  Follow-up with your primary care doctor.  A referral has been placed to cardiology to set you up with outpatient cardiac monitoring.  Return to the emergency department if any worsening or concerning symptoms.

## 2024-07-16 ENCOUNTER — Other Ambulatory Visit: Payer: Self-pay | Admitting: *Deleted

## 2024-07-16 ENCOUNTER — Ambulatory Visit: Attending: Endocrinology

## 2024-07-16 DIAGNOSIS — R000 Tachycardia, unspecified: Secondary | ICD-10-CM

## 2024-07-16 DIAGNOSIS — R002 Palpitations: Secondary | ICD-10-CM

## 2024-07-16 NOTE — Progress Notes (Unsigned)
 Enrolled for [employer] to mail a ZIO XT long term holter monitor to the patients address on file.   EP to read

## 2024-08-13 DIAGNOSIS — R000 Tachycardia, unspecified: Secondary | ICD-10-CM

## 2024-08-13 DIAGNOSIS — R002 Palpitations: Secondary | ICD-10-CM | POA: Diagnosis not present

## 2024-08-15 ENCOUNTER — Other Ambulatory Visit: Payer: Self-pay

## 2024-08-15 DIAGNOSIS — Z170 Estrogen receptor positive status [ER+]: Secondary | ICD-10-CM

## 2024-08-16 ENCOUNTER — Inpatient Hospital Stay: Payer: Medicare PPO | Admitting: Hematology and Oncology

## 2024-08-16 ENCOUNTER — Inpatient Hospital Stay: Payer: Medicare PPO | Attending: Hematology and Oncology

## 2024-08-16 VITALS — BP 119/60 | HR 85 | Temp 98.3°F | Resp 18 | Ht 66.5 in | Wt 144.0 lb

## 2024-08-16 DIAGNOSIS — Z170 Estrogen receptor positive status [ER+]: Secondary | ICD-10-CM

## 2024-08-16 DIAGNOSIS — C50411 Malignant neoplasm of upper-outer quadrant of right female breast: Secondary | ICD-10-CM

## 2024-08-16 LAB — CBC WITH DIFFERENTIAL (CANCER CENTER ONLY)
Abs Immature Granulocytes: 0.01 K/uL (ref 0.00–0.07)
Basophils Absolute: 0 K/uL (ref 0.0–0.1)
Basophils Relative: 1 %
Eosinophils Absolute: 0.1 K/uL (ref 0.0–0.5)
Eosinophils Relative: 2 %
HCT: 38.3 % (ref 36.0–46.0)
Hemoglobin: 12.9 g/dL (ref 12.0–15.0)
Immature Granulocytes: 0 %
Lymphocytes Relative: 29 %
Lymphs Abs: 1.4 K/uL (ref 0.7–4.0)
MCH: 30.2 pg (ref 26.0–34.0)
MCHC: 33.7 g/dL (ref 30.0–36.0)
MCV: 89.7 fL (ref 80.0–100.0)
Monocytes Absolute: 0.4 K/uL (ref 0.1–1.0)
Monocytes Relative: 8 %
Neutro Abs: 2.9 K/uL (ref 1.7–7.7)
Neutrophils Relative %: 60 %
Platelet Count: 269 K/uL (ref 150–400)
RBC: 4.27 MIL/uL (ref 3.87–5.11)
RDW: 12.1 % (ref 11.5–15.5)
WBC Count: 4.9 K/uL (ref 4.0–10.5)
nRBC: 0 % (ref 0.0–0.2)

## 2024-08-16 LAB — CMP (CANCER CENTER ONLY)
ALT: 27 U/L (ref 0–44)
AST: 29 U/L (ref 15–41)
Albumin: 4.1 g/dL (ref 3.5–5.0)
Alkaline Phosphatase: 113 U/L (ref 38–126)
Anion gap: 12 (ref 5–15)
BUN: 12 mg/dL (ref 8–23)
CO2: 23 mmol/L (ref 22–32)
Calcium: 9.5 mg/dL (ref 8.9–10.3)
Chloride: 103 mmol/L (ref 98–111)
Creatinine: 0.64 mg/dL (ref 0.44–1.00)
GFR, Estimated: >60 mL/min
Glucose, Bld: 97 mg/dL (ref 70–99)
Potassium: 3.8 mmol/L (ref 3.5–5.1)
Sodium: 138 mmol/L (ref 135–145)
Total Bilirubin: 0.3 mg/dL (ref 0.0–1.2)
Total Protein: 7.5 g/dL (ref 6.5–8.1)

## 2024-08-16 NOTE — Progress Notes (Signed)
 " Krista Butler  [phone] [phone]    D: LIBRADA CASTRONOVO  DOB: 1951/07/12  MR#: [id number]  RDW#:[id number]   Patient Care Team: Nichole Senior, MD as PCP - General (Endocrinology) Dewey Rush, MD as Consulting Physician (Radiation Oncology) Ebbie Cough, MD as Consulting Physician (General Surgery) Johnnye Ade, MD as Consulting Physician (Obstetrics and Gynecology) Loretha Ash, MD as Consulting Physician (Hematology and Oncology)  CHIEF COMPLAINT: Estrogen receptor positive right breast cancer; history of noninvasive left breast cancer.  CURRENT THERAPY: Tamoxifen ,  INTERVAL HISTORY:  Discussed the use of AI scribe software for clinical note transcription with the patient, who gave verbal consent to proceed.  History of Present Illness    The patient, with a history of breast cancer and osteoporosis, presents for a follow-up visit. She is currently on tamoxifen , which she has been taking for over a year, and reports doing well with no significant side effects. She was previously on anastrozole , but it was switched to tamoxifen  due to side effects including joint pains and brain fog.  The patient has not been taking any medication for osteoporosis, but has had a DEXA scan in the past and is considering treatment with Prolia. She expresses a desire to have another DEXA scan to assess her current bone density status.  She also reports some soreness around her scar tissue from previous breast cancer surgery, which has improved with physical therapy.   The patient has extremely dense breasts and has been having regular mammograms, the most recent of which was reported as normal. She expresses interest in having an MRI for further evaluation due to the density of her breasts.   COVID 19 VACCINATION STATUS: infection 02/2020 and May 2022, no vaccinations   RIGHT BREAST CANCER HISTORY: From the original intake note:   She underwent  bilateral diagnostic mammography with tomography and right breast ultrasonography at [facility] on 11/08/2019 showing: breast density category D; suspicious 2.5 cm right breast mass at 10 o'clock, muscle involvement cannot be excluded; no evidence of left breast malignancy or lymphadenopathy.  She proceeded to biopsy on 11/13/2019 of the right breast area in question. Pathology from the procedure ([id number]) revealed: invasive mammary carcinoma, grade 2, e-cadherin negative. Prognostic indicators significant for: estrogen receptor 95% positive, progesterone receptor 95% positive, both with a strong staining intensity. Proliferation marker Ki67 of 20%. Her2 negative by immunohistochemistry (1+).   History of left breast cancer: From Dr Sharma Canes earlier note:  #1Patient noted some thickening of the right breast. She did not have any symptoms in the left breast. The thickening did not show any discrete mass or distortion on the right. However the patient was found to have numerous bilateral calcifications. Within the left breast there was a new linear/branching calcifications measuring 12 mm. This was suspicious for malignancy. There are also 1.1 cm suspicious calcifications in a heterogeneous group within the upper inner quadrant of the right breast. She underwent stereotactic biopsy of both of these areas  #2The biopsy on the right was negative and only noted to be a fibroadenoma with associated coarse calcifications. 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  #3 patient had a MRI performed that showed a clot area of enhancement in the retroareolar region of the left breast measuring 1 cm in maximum does mention corresponding to the known DCIS.  #4 patient is now status post left-sided lumpectomy on 11/23/2012. Her pathology revealed DCIS  grade 3. Margins were negative with the closest margin being 1 mm from the  inferior posterior margins. Tumor was ER ER positive PR positive.  #5 status post completion of radiation therapy to the left breast on 02/08/2013. #6 patient will proceed with after her radiation with chemoprevention with tamoxifen  20 mg daily. But this will begin after she completes radiation therapy.  #7 NSABP B. 43 clinical study enrollment. He did meet with the nurse and her tissue will be sent for HER-2 testing. HER-2 negative and therefore did not enroll on B. 43 trial  #8 patient began adjuvant curative intent tamoxifen  20 mg daily starting 02/26/2013. Total of 5 years of therapy is planned.   MEDICAL HISTORY: Past Medical History:  Diagnosis Date   Allergy    medicated for this   Arm fracture    LEFT arm; as a child   Breast cancer (HCC)    left   Diverticulosis    Family history of breast cancer    Family history of kidney cancer    GERD (gastroesophageal reflux disease)    History of radiation therapy 12/25/12-02/08/13   64.4 Gy to left breast   Osteoporosis    Personal history of radiation therapy    Thyroid  disease    Wears glasses     SURGICAL HISTORY:  Past Surgical History:  Procedure Laterality Date   BREAST BIOPSY  1997   lt breast cyst   BREAST LUMPECTOMY Left 2014   BREAST LUMPECTOMY Right 2021   BREAST LUMPECTOMY WITH NEEDLE LOCALIZATION Left 11/23/2012   Procedure: BREAST LUMPECTOMY WITH NEEDLE LOCALIZATION;  Surgeon: Donnice Bury, MD;  Location: [facility];  Service: General;  Laterality: Left;   BREAST LUMPECTOMY WITH RADIOACTIVE SEED AND SENTINEL LYMPH NODE BIOPSY Right 12/26/2019   Procedure: RIGHT BREAST LUMPECTOMY WITH RADIOACTIVE SEED AND RIGHT AXILLARY SENTINEL LYMPH NODE BIOPSY;  Surgeon: Bury Donnice, MD;  Location: [facility];  Service: General;  Laterality: Right;   COLONOSCOPY     TONSILLECTOMY     UPPER GI ENDOSCOPY  2012    FAMILY HISTORY: Family History  Problem Relation Age of Onset    Heart disease Mother    Hypertension Mother    Stroke Father    Heart disease Father    Diabetes Father    Depression Sister    Hyperlipidemia Sister    Stroke Maternal Grandmother    Stroke Maternal Grandfather    Heart Problems Paternal Grandfather    Breast cancer Cousin        dx. younger than 21 (paternal cousin)   Cancer Cousin        dx. in her 71s, unknown type (paternal cousin)   Kidney cancer Cousin 74       (maternal cousin)   Other Cousin        genetic testing - unknown results (maternal cousin)   Colon cancer Neg Hx    Esophageal cancer Neg Hx    Pancreatic cancer Neg Hx    Stomach cancer Neg Hx    Liver disease Neg Hx   The patient's father died at the age of 78 following a stroke in the setting of severe heart disease. The patient's mother died also from heart disease at the age of 3. The patient had no brothers, one sister. There is no history of breast or ovarian cancer in the family.   GYN HISTORY: Menarche age 1, first live birth age 3. The patient is GX P2. She went  through menopause in her early 60s. She took hormone replacement briefly (approximately 2 years).   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 Her husband of 40+ years, Dasie, used to work for [employer] but is now a surveyor, minerals. Daughter Andrea Bach is a homemaker in [place]. Son Bard Moats is a tool and dye designer, television/film set. The patient has 5 grandchildren. She has one great grandchild. She attends the home church in [place].   ADVANCED DIRECTIVES: In the absence of any documentation to the contrary, the patient's spouse is their HCPOA.    HEALTH MAINTENANCE: Social History   Tobacco Use   Smoking status: Never   Smokeless tobacco: Never  Vaping Use   Vaping status: Never Used  Substance Use Topics   Alcohol  use: Yes    Alcohol /week: 2.0 standard drinks of alcohol     Types: 2 Glasses of wine per week    Comment: occ   Drug use: No      Colonoscopy  Pap smear  DEXA scan  Lipid panel  Current Outpatient Medications  Medication Sig Dispense Refill   AMBULATORY NON FORMULARY MEDICATION FungDX 1 capsul by mouth twice a day     AMBULATORY NON FORMULARY MEDICATION Bactrex 1 capsule by mouth twice a day     anastrozole  (ARIMIDEX ) 1 MG tablet Take 1 tablet (1 mg total) by mouth daily. (Patient not taking: Reported on 07/15/2023) 90 tablet 3   Ascorbic Acid (VITAMIN C PO) Take 1 tablet by mouth daily.     Biotin 1000 MCG tablet Take 1,000 mcg by mouth daily.     Cholecalciferol (VITAMIN D3 PO) Take 2 tablets by mouth daily.     levothyroxine (SYNTHROID, LEVOTHROID) 175 MCG tablet Take 175 mcg by mouth daily before breakfast.     loratadine (CLARITIN) 10 MG tablet Take 10 mg by mouth daily.     MAGNESIUM PO Take 1 Dose by mouth at bedtime. (Patient not taking: Reported on 07/15/2023)     montelukast (SINGULAIR) 10 MG tablet  (Patient not taking: Reported on 07/15/2023)     Multiple Minerals-Vitamins (BONE ESSENTIALS PO) Take 1 tablet by mouth daily.     Multiple Vitamins-Minerals (PHYTOMULTI PO) Take 3 tablets by mouth daily.     Pseudoephedrine-guaiFENesin (MUCINEX D PO) Take 1 capsule by mouth daily. (Patient not taking: Reported on 07/15/2023)     SALINE NA Place into the nose as needed.     tamoxifen  (NOLVADEX ) 20 MG tablet TAKE 1 TABLET BY MOUTH EVERY DAY 90 tablet 0   zinc gluconate 50 MG tablet Take 50 mg by mouth daily. 30 mg     No current facility-administered medications for this visit.    PHYSICAL EXAMINATION:  white woman who appears stated age  Blood pressure 119/60, pulse 85, temperature 98.3 F (36.8 C), temperature source Temporal, resp. rate 18, height 5' 6.5 (1.689 m), weight 144 lb (65.3 kg), SpO2 96%. Body mass index is 22.89 kg/m.   ECOG PERFORMANCE STATUS: 1 - Symptomatic but completely ambulatory BREAST: Extremely dense breasts, no obvious masses palpable. No regional adenopathy SKIN: No  swelling in legs.  LABORATORY DATA: Lab Results  Component Value Date   WBC 4.9 08/16/2024   HGB 12.9 08/16/2024   HCT 38.3 08/16/2024   MCV 89.7 08/16/2024   PLT 269 08/16/2024      Chemistry      Component Value Date/Time   NA 138 08/16/2024 1232   NA 142 06/13/2013 1317  K 3.8 08/16/2024 1232   K 4.0 06/13/2013 1317   CL 103 08/16/2024 1232   CL 102 11/15/2012 1224   CO2 23 08/16/2024 1232   CO2 27 06/13/2013 1317   BUN 12 08/16/2024 1232   BUN 13.0 06/13/2013 1317   CREATININE 0.64 08/16/2024 1232   CREATININE 0.7 06/13/2013 1317      Component Value Date/Time   CALCIUM 9.5 08/16/2024 1232   CALCIUM 10.1 06/13/2013 1317   ALKPHOS 113 08/16/2024 1232   ALKPHOS 81 06/13/2013 1317   AST 29 08/16/2024 1232   AST 22 06/13/2013 1317   ALT 27 08/16/2024 1232   ALT 16 06/13/2013 1317   BILITOT 0.3 08/16/2024 1232   BILITOT 0.39 06/13/2013 1317      RESULTS: LONG TERM MONITOR (3-14 DAYS) Result Date: 08/13/2024 14 day monitor HR 57 - 146, average 80 bpm. Multiple atrial runs, rates average about 115 bpm, longest episode was 8.6s No atrial fibrillation detected. Rare supraventricular ectopy. Rare ventricular ectopy. No sustained arrhythmias. Symptom trigger episodes correspond to atrial ectopy, sometimes occurring in runs [facility]     ASSESSMENT:  74 y.o. [place], [place] woman:  LEFT BREAST CANCER:  (1) status post left lumpectomy 11/23/2012 for ductal carcinoma in situ, grade 3, estrogen receptor 100% positive, progesterone receptor 0% positive, with -4 very close margins  (2) considered  NSABP B-43  but was found to be HER-2 negative ([id number])  (3) completed adjuvant radiation therapy on 02/08/2013.  (4) tamoxifen  20 mg daily started August 2014, discontinued August 2019  RIGHT BREAST CANCER: (5) status post right breast upper outer quadrant biopsy 11/13/2019 for a clinical T2 N0, stage IB invasive lobular carcinoma, E-cadherin  negative, estrogen and progesterone receptor positive, with an MIB-1 of 20% and no HER-2 amplification  (6) genetics testing 12/03/2019 through the [facility] STAT + Multi-Cancer panels found no deleterious mutations in ATM, BRCA1, BRCA2, CDH1, CHEK2, PALB2, PTEN, STK11 and TP53. The Multi-Cancer Panel offered by [facility] includes sequencing and/or deletion duplication testing of the following 85 genes: AIP, ALK, APC, ATM, AXIN2,BAP1,  BARD1, BLM, BMPR1A, BRCA1, BRCA2, BRIP1, CASR, CDC73, CDH1, CDK4, CDKN1B, CDKN1C, CDKN2A (p14ARF), CDKN2A (p16INK4a), CEBPA, CHEK2, CTNNA1, DICER1, DIS3L2, EGFR (c.2369C>T, p.Thr790Met variant only), EPCAM (Deletion/duplication testing only), FH, FLCN, GATA2, GPC3, GREM1 (Promoter region deletion/duplication testing only), HOXB13 (c.251G>A, p.Gly84Glu), HRAS, KIT, MAX, MEN1, MET, MITF (c.952G>A, p.Glu318Lys variant only), MLH1, MSH2, MSH3, MSH6, MUTYH, NBN, NF1, NF2, NTHL1, PALB2, PDGFRA, PHOX2B, PMS2, POLD1, POLE, POT1, PRKAR1A, PTCH1, PTEN, RAD50, RAD51C, RAD51D, RB1, RECQL4, RET, RNF43, RUNX1, SDHAF2, SDHA (sequence changes only), SDHB, SDHC, SDHD, SMAD4, SMARCA4, SMARCB1, SMARCE1, STK11, SUFU, TERC, TERT, TMEM127, TP53, TSC1, TSC2, VHL, WRN and WT1.  (7) Oncotype testing on the 11/13/2019 biopsy showed a score of 22, predicting a recurrence outside the breast in the next 9 years of 8% if the patient only systemic therapy was tamoxifen  or an aromatase inhibitor for 5 years.  It also predicted no benefit from chemotherapy.  (8) status post right lumpectomy and sentinel lymph node sampling 12/26/2019 for a pT1c pN0, stage 1A invasive lobular carcinoma, with negative margins.  (a) a total of 6 axillary lymph nodes were removed.  (9) adjuvant radiation6/28/21 - 02/14/20  Site/dose:   The patient initially received a dose of 42.56 Gy in 16 fractions to the breast using whole-breast tangent fields. This was delivered using a 3-D conformal technique. The patient then  received a boost to the seroma. This delivered an additional  8 Gy in 3fractions using a 3 field photon technique due to the depth of the seroma. The total dose was 50.56 Gy.   (10) anastrozole  started 03/10/2020  (a) osteoporosis: considering denosumab/Prolia   (b) referred to pelvic rehab 05/23/2020   (11) Switched back to Tamoxifen  in 2024, took it for more than an yr.   PLAN:   Breast Cancer On Tamoxifen  for over a year with good tolerance. No new breast lumps or changes noted on examination. Extremely dense breasts noted on previous mammograms. -Continue Tamoxifen  as prescribed. -Order diagnostic mammogram for July 2025. -Order breast MRI for December 2025 to further evaluate due to extremely dense breasts.   Total time spent: *Total Encounter Time as defined by the Centers for Medicare and Medicaid Services includes, in addition to the face-to-face time of a patient visit (documented in the note above) non-face-to-face time: obtaining and reviewing outside history, ordering and reviewing medications, tests or procedures, care coordination (communications with other health care professionals or caregivers) and documentation in the medical record. "

## 2024-08-16 NOTE — Progress Notes (Signed)
 " [facility]  [phone]) [phone] [phone]) [phone]    D: Krist[facility]  DOB: [birth year]  MR#: [id number]  RDW#:[id number]   Patient Care Team: Nichole Senior, MD as PCP - General (Endocrinology) Krista Rush, MD as Consulting Physician (Radiation Oncology) Ebbie Cough, MD as Consulting Physician (General Surgery) Johnnye Ade, MD as Consulting Physician (Obstetrics and Gynecology) Loretha Ash, MD as Consulting Physician (Hematology and Oncology)  CHIEF COMPLAINT: Estrogen receptor positive right breast cancer; history of noninvasive left breast cancer.  CURRENT THERAPY: Tamoxifen ,  INTERVAL HISTORY:  Discussed the use of AI scribe software for clinical note transcription with the patient, who gave verbal consent to proceed.  History of Present Illness    The patient, with a history of breast cancer and osteoporosis, presents for a follow-up visit. She is currently on tamoxifen , which she has been taking for over a year, and reports doing well with no significant side effects. She was previously on anastrozole , but it was switched to tamoxifen  due to side effects including joint pains and brain fog.  The patient has not been taking any medication for osteoporosis, prolia was suggested but she is hoping tamoxifen  will help with bone density.  She also reports some soreness around her scar tissue from previous breast cancer surgery, which has improved with physical therapy.   The patient has extremely dense breasts and has been having regular mammograms, the most recent of which was reported as normal. She expresses interest in having an MRI for further evaluation due to the density of her breasts.   COVID 19 VACCINATION STATUS: infection 02/2020 and May 2022, no vaccinations   RIGHT BREAST CANCER HISTORY: From the original intake note:   She underwent bilateral diagnostic mammography with tomography and right breast ultrasonography at [facility] on 11/08/2019 showing: breast density category D; suspicious 2.5 cm right breast mass at 10 o'clock, muscle involvement cannot be excluded; no evidence of left breast malignancy or lymphadenopathy.  She proceeded to biopsy on 11/13/2019 of the right breast area in question. Pathology from the procedure ([id number]) revealed: invasive mammary carcinoma, grade 2, e-cadherin negative. Prognostic indicators significant for: estrogen receptor 95% positive, progesterone receptor 95% positive, both with a strong staining intensity. Proliferation marker Ki67 of 20%. Her2 negative by immunohistochemistry (1+).   History of left breast cancer: From Dr Sharma Canes earlier note:  #1Patient noted some thickening of the right breast. She did not have any symptoms in the left breast. The thickening did not show any discrete mass or distortion on the right. However the patient was found to have numerous bilateral calcifications. Within the left breast there was a new linear/branching calcifications measuring 12 mm. This was suspicious for malignancy. There are also 1.1 cm suspicious calcifications in a heterogeneous group within the upper inner quadrant of the right breast. She underwent stereotactic biopsy of both of these areas  #2The biopsy on the right was negative and only noted to be a fibroadenoma with associated coarse calcifications. 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  #3 patient had a MRI performed that showed a clot area of enhancement in the retroareolar region of the left breast measuring 1 cm in maximum does mention corresponding to the known DCIS.  #4 patient is now status post left-sided lumpectomy on 11/23/2012. Her pathology revealed DCIS grade 3. Margins were negative with the closest margin being 1 mm from the inferior posterior margins. Tumor  was ER ER positive PR positive.  #5 status post completion  of radiation therapy to the left breast on 02/08/2013. #6 patient will proceed with after her radiation with chemoprevention with tamoxifen  20 mg daily. But this will begin after she completes radiation therapy.  #7 NSABP B. 43 clinical study enrollment. He did meet with the nurse and her tissue will be sent for HER-2 testing. HER-2 negative and therefore did not enroll on B. 43 trial  #8 patient began adjuvant curative intent tamoxifen  20 mg daily starting 02/26/2013. Total of 7 years of therapy is planned.   MEDICAL HISTORY: Past Medical History:  Diagnosis Date   Allergy    medicated for this   Arm fracture    LEFT arm; as a child   Breast cancer (HCC)    left   Diverticulosis    Family history of breast cancer    Family history of kidney cancer    GERD (gastroesophageal reflux disease)    History of radiation therapy 12/25/12-02/08/13   64.4 Gy to left breast   Osteoporosis    Personal history of radiation therapy    Thyroid  disease    Wears glasses     SURGICAL HISTORY:  Past Surgical History:  Procedure Laterality Date   BREAST BIOPSY  1997   lt breast cyst   BREAST LUMPECTOMY Left 2014   BREAST LUMPECTOMY Right 2021   BREAST LUMPECTOMY WITH NEEDLE LOCALIZATION Left 11/23/2012   Procedure: BREAST LUMPECTOMY WITH NEEDLE LOCALIZATION;  Surgeon: Donnice Bury, MD;  Location: [facility];  Service: General;  Laterality: Left;   BREAST LUMPECTOMY WITH RADIOACTIVE SEED AND SENTINEL LYMPH NODE BIOPSY Right 12/26/2019   Procedure: RIGHT BREAST LUMPECTOMY WITH RADIOACTIVE SEED AND RIGHT AXILLARY SENTINEL LYMPH NODE BIOPSY;  Surgeon: Bury Donnice, MD;  Location: [facility];  Service: General;  Laterality: Right;   COLONOSCOPY     TONSILLECTOMY     UPPER GI ENDOSCOPY  2012    FAMILY HISTORY: Family History  Problem Relation Age of Onset   Heart disease Mother    Hypertension Mother    Stroke Father    Heart disease Father     Diabetes Father    Depression Sister    Hyperlipidemia Sister    Stroke Maternal Grandmother    Stroke Maternal Grandfather    Heart Problems Paternal Grandfather    Breast cancer Cousin        dx. younger than 76 (paternal cousin)   Cancer Cousin        dx. in her 38s, unknown type (paternal cousin)   Kidney cancer Cousin 72       (maternal cousin)   Other Cousin        genetic testing - unknown results (maternal cousin)   Colon cancer Neg Hx    Esophageal cancer Neg Hx    Pancreatic cancer Neg Hx    Stomach cancer Neg Hx    Liver disease Neg Hx   The patient's father died at the age of 62 following a stroke in the setting of severe heart disease. The patient's mother died also from heart disease at the age of 24. The patient had no brothers, one sister. There is no history of breast or ovarian cancer in the family.   GYN HISTORY: Menarche age 60, first live birth age 45. The patient is GX P2. She went through menopause in her early 68s. She took hormone replacement briefly (approximately 2 years).   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 Her husband of 40+ years, Dasie, used to work for [employer] but is now a surveyor, minerals. Daughter Andrea Bach is a homemaker in [place]. Son Bard Moats is a tool and dye designer, television/film set. The patient has 5 grandchildren. She has one great grandchild. She attends the home church in [place].   ADVANCED DIRECTIVES: In the absence of any documentation to the contrary, the patient's spouse is their HCPOA.    HEALTH MAINTENANCE: Social History   Tobacco Use   Smoking status: Never   Smokeless tobacco: Never  Vaping Use   Vaping status: Never Used  Substance Use Topics   Alcohol  use: Yes    Alcohol /week: 2.0 standard drinks of alcohol     Types: 2 Glasses of wine per week    Comment: occ   Drug use: No     Colonoscopy  Pap smear  DEXA scan  Lipid panel  Current Outpatient Medications   Medication Sig Dispense Refill   AMBULATORY NON FORMULARY MEDICATION FungDX 1 capsul by mouth twice a day     AMBULATORY NON FORMULARY MEDICATION Bactrex 1 capsule by mouth twice a day     anastrozole  (ARIMIDEX ) 1 MG tablet Take 1 tablet (1 mg total) by mouth daily. (Patient not taking: Reported on 07/15/2023) 90 tablet 3   Ascorbic Acid (VITAMIN C PO) Take 1 tablet by mouth daily.     Biotin 1000 MCG tablet Take 1,000 mcg by mouth daily.     Cholecalciferol (VITAMIN D3 PO) Take 2 tablets by mouth daily.     levothyroxine (SYNTHROID, LEVOTHROID) 175 MCG tablet Take 175 mcg by mouth daily before breakfast.     loratadine (CLARITIN) 10 MG tablet Take 10 mg by mouth daily.     MAGNESIUM PO Take 1 Dose by mouth at bedtime. (Patient not taking: Reported on 07/15/2023)     montelukast (SINGULAIR) 10 MG tablet  (Patient not taking: Reported on 07/15/2023)     Multiple Minerals-Vitamins (BONE ESSENTIALS PO) Take 1 tablet by mouth daily.     Multiple Vitamins-Minerals (PHYTOMULTI PO) Take 3 tablets by mouth daily.     Pseudoephedrine-guaiFENesin (MUCINEX D PO) Take 1 capsule by mouth daily. (Patient not taking: Reported on 07/15/2023)     SALINE NA Place into the nose as needed.     tamoxifen  (NOLVADEX ) 20 MG tablet TAKE 1 TABLET BY MOUTH EVERY DAY 90 tablet 0   zinc gluconate 50 MG tablet Take 50 mg by mouth daily. 30 mg     No current facility-administered medications for this visit.    PHYSICAL EXAMINATION:  white woman who appears stated age  Blood pressure 119/60, pulse 85, temperature 98.3 F (36.8 C), temperature source Temporal, resp. rate 18, height 5' 6.5 (1.689 m), weight 144 lb (65.3 kg), SpO2 96%. Body mass index is 22.89 kg/m.   ECOG PERFORMANCE STATUS: 1 - Symptomatic but completely ambulatory BREAST: Extremely dense breasts, no obvious masses palpable. No regional adenopathy SKIN: No swelling in legs.  LABORATORY DATA: Lab Results  Component Value Date   WBC 4.9  08/16/2024   HGB 12.9 08/16/2024   HCT 38.3 08/16/2024   MCV 89.7 08/16/2024   PLT 269 08/16/2024      Chemistry      Component Value Date/Time   NA 138 08/16/2024 1232   NA 142 06/13/2013 1317   K 3.8 08/16/2024 1232   K 4.0 06/13/2013 1317   CL 103 08/16/2024 1232  CL 102 11/15/2012 1224   CO2 23 08/16/2024 1232   CO2 27 06/13/2013 1317   BUN 12 08/16/2024 1232   BUN 13.0 06/13/2013 1317   CREATININE 0.64 08/16/2024 1232   CREATININE 0.7 06/13/2013 1317      Component Value Date/Time   CALCIUM 9.5 08/16/2024 1232   CALCIUM 10.1 06/13/2013 1317   ALKPHOS 113 08/16/2024 1232   ALKPHOS 81 06/13/2013 1317   AST 29 08/16/2024 1232   AST 22 06/13/2013 1317   ALT 27 08/16/2024 1232   ALT 16 06/13/2013 1317   BILITOT 0.3 08/16/2024 1232   BILITOT 0.39 06/13/2013 1317      RESULTS: LONG TERM MONITOR (3-14 DAYS) Result Date: 08/13/2024 14 day monitor HR 57 - 146, average 80 bpm. Multiple atrial runs, rates average about 115 bpm, longest episode was 8.6s No atrial fibrillation detected. Rare supraventricular ectopy. Rare ventricular ectopy. No sustained arrhythmias. Symptom trigger episodes correspond to atrial ectopy, sometimes occurring in runs [facility]     ASSESSMENT:  74 y.o. [place], [place] woman:  LEFT BREAST CANCER:  (1) status post left lumpectomy 11/23/2012 for ductal carcinoma in situ, grade 3, estrogen receptor 100% positive, progesterone receptor 0% positive, with -4 very close margins  (2) considered  NSABP B-43  but was found to be HER-2 negative ([id number])  (3) completed adjuvant radiation therapy on 02/08/2013.  (4) tamoxifen  20 mg daily started August 2014, discontinued August 2019  RIGHT BREAST CANCER: (5) status post right breast upper outer quadrant biopsy 11/13/2019 for a clinical T2 N0, stage IB invasive lobular carcinoma, E-cadherin negative, estrogen and progesterone receptor positive, with an MIB-1 of 20% and no  HER-2 amplification  (6) genetics testing 12/03/2019 through the [facility] STAT + Multi-Cancer panels found no deleterious mutations in ATM, BRCA1, BRCA2, CDH1, CHEK2, PALB2, PTEN, STK11 and TP53. The Multi-Cancer Panel offered by [facility] includes sequencing and/or deletion duplication testing of the following 85 genes: AIP, ALK, APC, ATM, AXIN2,BAP1,  BARD1, BLM, BMPR1A, BRCA1, BRCA2, BRIP1, CASR, CDC73, CDH1, CDK4, CDKN1B, CDKN1C, CDKN2A (p14ARF), CDKN2A (p16INK4a), CEBPA, CHEK2, CTNNA1, DICER1, DIS3L2, EGFR (c.2369C>T, p.Thr790Met variant only), EPCAM (Deletion/duplication testing only), FH, FLCN, GATA2, GPC3, GREM1 (Promoter region deletion/duplication testing only), HOXB13 (c.251G>A, p.Gly84Glu), HRAS, KIT, MAX, MEN1, MET, MITF (c.952G>A, p.Glu318Lys variant only), MLH1, MSH2, MSH3, MSH6, MUTYH, NBN, NF1, NF2, NTHL1, PALB2, PDGFRA, PHOX2B, PMS2, POLD1, POLE, POT1, PRKAR1A, PTCH1, PTEN, RAD50, RAD51C, RAD51D, RB1, RECQL4, RET, RNF43, RUNX1, SDHAF2, SDHA (sequence changes only), SDHB, SDHC, SDHD, SMAD4, SMARCA4, SMARCB1, SMARCE1, STK11, SUFU, TERC, TERT, TMEM127, TP53, TSC1, TSC2, VHL, WRN and WT1.  (7) Oncotype testing on the 11/13/2019 biopsy showed a score of 22, predicting a recurrence outside the breast in the next 9 years of 8% if the patient only systemic therapy was tamoxifen  or an aromatase inhibitor for 5 years.  It also predicted no benefit from chemotherapy.  (8) status post right lumpectomy and sentinel lymph node sampling 12/26/2019 for a pT1c pN0, stage 1A invasive lobular carcinoma, with negative margins.  (a) a total of 6 axillary lymph nodes were removed.  (9) adjuvant radiation6/28/21 - 02/14/20  Site/dose:   The patient initially received a dose of 42.56 Gy in 16 fractions to the breast using whole-breast tangent fields. This was delivered using a 3-D conformal technique. The patient then received a boost to the seroma. This delivered an additional 8 Gy in 34fractions using  a 3 field photon technique due to the depth of the seroma. The  total dose was 50.56 Gy.   (10) anastrozole  started 03/10/2020  (a) osteoporosis: considering denosumab/Prolia   (b) referred to pelvic rehab 05/23/2020   (11) Switched back to Tamoxifen  in 2024, took it for more than an yr.   PLAN:  Assessment & Plan Estrogen receptor positive invasive carcinoma of the right breast Managed with adjuvant tamoxifen   Tolerated tamoxifen  well with resolved symptoms from anastrozole . Discussed extended tamoxifen  duration due to lobular histology and risk of late recurrence, with bone density benefits. - Continue tamoxifen  therapy. - Discussed extending tamoxifen  duration up to seven years due to lobular histology, risk of late recurrence, and potential gradual benefit for bone density.  Dense breasts Markedly dense breast tissue reduces mammographic sensitivity. Annual breast MRI recommended for better malignancy detection. - Ordered annual breast MRI for February 2026. - mammogram in Aug 2026, ordered.  Total time spent: 30 min  *Total Encounter Time as defined by the Centers for Medicare and Medicaid Services includes, in addition to the face-to-face time of a patient visit (documented in the note above) non-face-to-face time: obtaining and reviewing outside history, ordering and reviewing medications, tests or procedures, care coordination (communications with other health care professionals or caregivers) and documentation in the medical record. "

## 2024-09-15 ENCOUNTER — Other Ambulatory Visit

## 2025-02-21 ENCOUNTER — Ambulatory Visit

## 2025-08-16 ENCOUNTER — Inpatient Hospital Stay

## 2025-08-16 ENCOUNTER — Inpatient Hospital Stay: Admitting: Hematology and Oncology
